# Patient Record
Sex: Female | Born: 1945 | Race: White | Hispanic: No | Marital: Married | Smoking: Former smoker
Health system: Southern US, Community
[De-identification: ages and names within clinical notes are randomized; demographics above are authoritative.]

## PROBLEM LIST (undated history)

## (undated) DIAGNOSIS — Z9889 Other specified postprocedural states: Secondary | ICD-10-CM

## (undated) DIAGNOSIS — I471 Supraventricular tachycardia: Secondary | ICD-10-CM

## (undated) DIAGNOSIS — K219 Gastro-esophageal reflux disease without esophagitis: Secondary | ICD-10-CM

## (undated) DIAGNOSIS — I499 Cardiac arrhythmia, unspecified: Secondary | ICD-10-CM

## (undated) DIAGNOSIS — E785 Hyperlipidemia, unspecified: Secondary | ICD-10-CM

## (undated) DIAGNOSIS — H409 Unspecified glaucoma: Secondary | ICD-10-CM

## (undated) DIAGNOSIS — Z9221 Personal history of antineoplastic chemotherapy: Secondary | ICD-10-CM

## (undated) DIAGNOSIS — R112 Nausea with vomiting, unspecified: Secondary | ICD-10-CM

## (undated) DIAGNOSIS — C50919 Malignant neoplasm of unspecified site of unspecified female breast: Secondary | ICD-10-CM

## (undated) DIAGNOSIS — Z923 Personal history of irradiation: Secondary | ICD-10-CM

## (undated) DIAGNOSIS — I10 Essential (primary) hypertension: Secondary | ICD-10-CM

## (undated) DIAGNOSIS — I4719 Other supraventricular tachycardia: Secondary | ICD-10-CM

## (undated) HISTORY — PX: ANKLE SURGERY: SHX546

## (undated) HISTORY — PX: ABDOMINAL HYSTERECTOMY: SHX81

## (undated) HISTORY — PX: APPENDECTOMY: SHX54

## (undated) HISTORY — PX: TONSILLECTOMY: SUR1361

## (undated) HISTORY — PX: CATARACT EXTRACTION W/ INTRAOCULAR LENS IMPLANT: SHX1309

## (undated) HISTORY — PX: BREAST BIOPSY: SHX20

## (undated) HISTORY — PX: ROTATOR CUFF REPAIR: SHX139

---

## 1998-11-18 ENCOUNTER — Encounter: Payer: Self-pay | Admitting: Orthopedic Surgery

## 1998-11-20 ENCOUNTER — Observation Stay (HOSPITAL_COMMUNITY): Admission: RE | Admit: 1998-11-20 | Discharge: 1998-11-21 | Payer: Self-pay | Admitting: Orthopedic Surgery

## 1999-11-27 ENCOUNTER — Emergency Department (HOSPITAL_COMMUNITY): Admission: EM | Admit: 1999-11-27 | Discharge: 1999-11-28 | Payer: Self-pay | Admitting: Emergency Medicine

## 1999-12-03 ENCOUNTER — Encounter: Payer: Self-pay | Admitting: Urology

## 1999-12-03 ENCOUNTER — Ambulatory Visit (HOSPITAL_COMMUNITY): Admission: RE | Admit: 1999-12-03 | Discharge: 1999-12-03 | Payer: Self-pay | Admitting: Urology

## 2003-02-28 ENCOUNTER — Other Ambulatory Visit: Admission: RE | Admit: 2003-02-28 | Discharge: 2003-02-28 | Payer: Self-pay | Admitting: Obstetrics and Gynecology

## 2004-10-08 ENCOUNTER — Ambulatory Visit: Payer: Self-pay | Admitting: Internal Medicine

## 2004-12-23 ENCOUNTER — Ambulatory Visit: Payer: Self-pay | Admitting: Family Medicine

## 2005-01-12 ENCOUNTER — Ambulatory Visit: Payer: Self-pay | Admitting: Family Medicine

## 2005-09-19 ENCOUNTER — Ambulatory Visit: Payer: Self-pay | Admitting: Family Medicine

## 2006-01-05 ENCOUNTER — Ambulatory Visit: Payer: Self-pay | Admitting: Family Medicine

## 2006-01-10 ENCOUNTER — Ambulatory Visit: Payer: Self-pay | Admitting: Family Medicine

## 2006-01-26 ENCOUNTER — Ambulatory Visit: Payer: Self-pay | Admitting: Internal Medicine

## 2006-01-30 ENCOUNTER — Ambulatory Visit: Payer: Self-pay | Admitting: Family Medicine

## 2006-02-20 ENCOUNTER — Ambulatory Visit: Payer: Self-pay | Admitting: Internal Medicine

## 2007-05-07 ENCOUNTER — Encounter: Admission: RE | Admit: 2007-05-07 | Discharge: 2007-05-07 | Payer: Self-pay | Admitting: Family Medicine

## 2007-05-09 ENCOUNTER — Telehealth (INDEPENDENT_AMBULATORY_CARE_PROVIDER_SITE_OTHER): Payer: Self-pay | Admitting: *Deleted

## 2007-05-17 ENCOUNTER — Encounter: Admission: RE | Admit: 2007-05-17 | Discharge: 2007-05-17 | Payer: Self-pay | Admitting: Family Medicine

## 2007-05-23 ENCOUNTER — Telehealth: Payer: Self-pay | Admitting: *Deleted

## 2007-05-24 ENCOUNTER — Ambulatory Visit: Payer: Self-pay | Admitting: Family Medicine

## 2007-05-24 DIAGNOSIS — B029 Zoster without complications: Secondary | ICD-10-CM | POA: Insufficient documentation

## 2007-06-11 DIAGNOSIS — I10 Essential (primary) hypertension: Secondary | ICD-10-CM | POA: Insufficient documentation

## 2007-06-11 DIAGNOSIS — H409 Unspecified glaucoma: Secondary | ICD-10-CM | POA: Insufficient documentation

## 2007-06-11 DIAGNOSIS — K219 Gastro-esophageal reflux disease without esophagitis: Secondary | ICD-10-CM | POA: Insufficient documentation

## 2007-06-11 DIAGNOSIS — E785 Hyperlipidemia, unspecified: Secondary | ICD-10-CM | POA: Insufficient documentation

## 2007-06-11 DIAGNOSIS — J309 Allergic rhinitis, unspecified: Secondary | ICD-10-CM | POA: Insufficient documentation

## 2007-07-06 ENCOUNTER — Ambulatory Visit: Payer: Self-pay | Admitting: Family Medicine

## 2007-07-06 LAB — CONVERTED CEMR LAB
Alkaline Phosphatase: 72 units/L (ref 39–117)
BUN: 22 mg/dL (ref 6–23)
Basophils Relative: 0.3 % (ref 0.0–1.0)
Bilirubin Urine: NEGATIVE
Bilirubin, Direct: 0.1 mg/dL (ref 0.0–0.3)
CO2: 30 meq/L (ref 19–32)
Creatinine, Ser: 0.7 mg/dL (ref 0.4–1.2)
Eosinophils Relative: 2.2 % (ref 0.0–5.0)
Glucose, Bld: 103 mg/dL — ABNORMAL HIGH (ref 70–99)
Glucose, Urine, Semiquant: NEGATIVE
HCT: 38.8 % (ref 36.0–46.0)
Hemoglobin: 13.8 g/dL (ref 12.0–15.0)
Ketones, urine, test strip: NEGATIVE
LDL Cholesterol: 109 mg/dL — ABNORMAL HIGH (ref 0–99)
Lymphocytes Relative: 37.7 % (ref 12.0–46.0)
Monocytes Absolute: 0.3 10*3/uL (ref 0.2–0.7)
Neutrophils Relative %: 52.3 % (ref 43.0–77.0)
Nitrite: NEGATIVE
Potassium: 4.4 meq/L (ref 3.5–5.1)
Protein, U semiquant: NEGATIVE
RDW: 12.5 % (ref 11.5–14.6)
Sodium: 144 meq/L (ref 135–145)
Specific Gravity, Urine: 1.02
Total Bilirubin: 0.9 mg/dL (ref 0.3–1.2)
Total Protein: 6.2 g/dL (ref 6.0–8.3)
Urobilinogen, UA: 0.2
VLDL: 23 mg/dL (ref 0–40)
WBC Urine, dipstick: NEGATIVE
WBC: 4.3 10*3/uL — ABNORMAL LOW (ref 4.5–10.5)
pH: 6

## 2007-07-13 ENCOUNTER — Ambulatory Visit: Payer: Self-pay | Admitting: Family Medicine

## 2008-05-29 ENCOUNTER — Ambulatory Visit (HOSPITAL_BASED_OUTPATIENT_CLINIC_OR_DEPARTMENT_OTHER): Admission: RE | Admit: 2008-05-29 | Discharge: 2008-05-29 | Payer: Self-pay | Admitting: Family Medicine

## 2008-07-30 ENCOUNTER — Telehealth: Payer: Self-pay | Admitting: Family Medicine

## 2008-08-22 ENCOUNTER — Ambulatory Visit: Payer: Self-pay | Admitting: Family Medicine

## 2008-08-22 LAB — CONVERTED CEMR LAB
ALT: 42 units/L — ABNORMAL HIGH (ref 0–35)
AST: 33 units/L (ref 0–37)
Albumin: 4.3 g/dL (ref 3.5–5.2)
BUN: 13 mg/dL (ref 6–23)
Basophils Relative: 0.3 % (ref 0.0–3.0)
CO2: 31 meq/L (ref 19–32)
Chloride: 106 meq/L (ref 96–112)
Creatinine, Ser: 0.7 mg/dL (ref 0.4–1.2)
Eosinophils Relative: 2.2 % (ref 0.0–5.0)
Glucose, Urine, Semiquant: NEGATIVE
LDL Cholesterol: 89 mg/dL (ref 0–99)
Lymphocytes Relative: 32.7 % (ref 12.0–46.0)
MCV: 95.4 fL (ref 78.0–100.0)
Neutrophils Relative %: 57.2 % (ref 43.0–77.0)
RBC: 4.2 M/uL (ref 3.87–5.11)
T3 Uptake Ratio: 36.7 % (ref 22.5–37.0)
VLDL: 24 mg/dL (ref 0–40)
WBC: 4.5 10*3/uL (ref 4.5–10.5)
pH: 8

## 2008-09-02 ENCOUNTER — Ambulatory Visit: Payer: Self-pay | Admitting: Family Medicine

## 2010-04-08 ENCOUNTER — Telehealth: Payer: Self-pay | Admitting: Family Medicine

## 2010-09-08 ENCOUNTER — Ambulatory Visit (HOSPITAL_BASED_OUTPATIENT_CLINIC_OR_DEPARTMENT_OTHER): Admission: RE | Admit: 2010-09-08 | Discharge: 2010-09-08 | Payer: Self-pay | Admitting: Family Medicine

## 2010-09-08 ENCOUNTER — Ambulatory Visit: Payer: Self-pay | Admitting: Diagnostic Radiology

## 2010-11-18 NOTE — Progress Notes (Signed)
Summary: REFILL REQUEST  Phone Note Refill Request Message from:  Fax from Pharmacy on April 08, 2010 1:31 PM  Refills Requested: Medication #1:  LIPITOR 20 MG  TABS at bedtime   Dosage confirmed as above?Dosage Confirmed   Notes: [facility] - [employer], [employer].  Medication #2:  FUROSEMIDE 20 MG TABS Take 1 tablet by mouth every morning   Dosage confirmed as above?Dosage Confirmed   Notes: [facility] - [employer], [employer].  Medication #3:  ACCUPRIL 20 MG  TABS one by mouth daily   Dosage confirmed as above?Dosage Confirmed   Notes: [facility] - [employer], [employer].    Initial call taken by: Debbra Riding,  April 08, 2010 1:37 PM  Follow-up for Phone Call        Denied Rx due to she has not been in here for a office visit since 2009. Time to come in for cpx & labs Follow-up by: Kathrynn Speed CMA,  April 08, 2010 2:32 PM  Additional Follow-up for Phone Call Additional follow up Details #1::        Contacted pt and left msg advising her to call in and schedule appt with Dr Tawanna Cooler for cpx and labs.  Additional Follow-up by: Debbra Riding,  April 09, 2010 8:08 AM

## 2011-08-25 ENCOUNTER — Other Ambulatory Visit (HOSPITAL_BASED_OUTPATIENT_CLINIC_OR_DEPARTMENT_OTHER): Payer: Self-pay | Admitting: Family Medicine

## 2011-08-25 DIAGNOSIS — Z1231 Encounter for screening mammogram for malignant neoplasm of breast: Secondary | ICD-10-CM

## 2011-09-06 ENCOUNTER — Ambulatory Visit (HOSPITAL_BASED_OUTPATIENT_CLINIC_OR_DEPARTMENT_OTHER)
Admission: RE | Admit: 2011-09-06 | Discharge: 2011-09-06 | Disposition: A | Discharge status: Home / Self Care | Payer: PRIVATE HEALTH INSURANCE | Source: Ambulatory Visit | Attending: Family Medicine | Admitting: Family Medicine

## 2011-09-06 DIAGNOSIS — Z1231 Encounter for screening mammogram for malignant neoplasm of breast: Secondary | ICD-10-CM

## 2013-09-05 ENCOUNTER — Other Ambulatory Visit (HOSPITAL_BASED_OUTPATIENT_CLINIC_OR_DEPARTMENT_OTHER): Payer: Self-pay | Admitting: Internal Medicine

## 2013-09-05 DIAGNOSIS — Z1231 Encounter for screening mammogram for malignant neoplasm of breast: Secondary | ICD-10-CM

## 2013-09-11 ENCOUNTER — Ambulatory Visit (HOSPITAL_BASED_OUTPATIENT_CLINIC_OR_DEPARTMENT_OTHER)
Admission: RE | Admit: 2013-09-11 | Discharge: 2013-09-11 | Disposition: A | Discharge status: Home / Self Care | Payer: PRIVATE HEALTH INSURANCE | Source: Ambulatory Visit | Attending: Internal Medicine | Admitting: Internal Medicine

## 2013-09-11 DIAGNOSIS — Z1231 Encounter for screening mammogram for malignant neoplasm of breast: Secondary | ICD-10-CM | POA: Insufficient documentation

## 2016-02-03 ENCOUNTER — Encounter: Payer: Self-pay | Admitting: Gastroenterology

## 2016-02-15 ENCOUNTER — Other Ambulatory Visit (HOSPITAL_BASED_OUTPATIENT_CLINIC_OR_DEPARTMENT_OTHER): Payer: Self-pay | Admitting: Internal Medicine

## 2016-02-15 DIAGNOSIS — Z1231 Encounter for screening mammogram for malignant neoplasm of breast: Secondary | ICD-10-CM

## 2016-02-18 ENCOUNTER — Ambulatory Visit (HOSPITAL_BASED_OUTPATIENT_CLINIC_OR_DEPARTMENT_OTHER)
Admission: RE | Admit: 2016-02-18 | Discharge: 2016-02-18 | Disposition: A | Discharge status: Home / Self Care | Payer: PRIVATE HEALTH INSURANCE | Source: Ambulatory Visit | Attending: Internal Medicine | Admitting: Internal Medicine

## 2016-02-18 DIAGNOSIS — Z1231 Encounter for screening mammogram for malignant neoplasm of breast: Secondary | ICD-10-CM | POA: Insufficient documentation

## 2016-02-23 ENCOUNTER — Ambulatory Visit (HOSPITAL_BASED_OUTPATIENT_CLINIC_OR_DEPARTMENT_OTHER): Payer: PRIVATE HEALTH INSURANCE

## 2017-03-18 ENCOUNTER — Encounter (HOSPITAL_BASED_OUTPATIENT_CLINIC_OR_DEPARTMENT_OTHER): Payer: Self-pay | Admitting: *Deleted

## 2017-03-18 ENCOUNTER — Emergency Department (HOSPITAL_BASED_OUTPATIENT_CLINIC_OR_DEPARTMENT_OTHER)
Admission: EM | Admit: 2017-03-18 | Discharge: 2017-03-18 | Disposition: A | Discharge status: Home / Self Care | Payer: PRIVATE HEALTH INSURANCE | Attending: Emergency Medicine | Admitting: Emergency Medicine

## 2017-03-18 DIAGNOSIS — H538 Other visual disturbances: Secondary | ICD-10-CM | POA: Insufficient documentation

## 2017-03-18 DIAGNOSIS — I10 Essential (primary) hypertension: Secondary | ICD-10-CM | POA: Diagnosis not present

## 2017-03-18 DIAGNOSIS — Z79899 Other long term (current) drug therapy: Secondary | ICD-10-CM | POA: Insufficient documentation

## 2017-03-18 DIAGNOSIS — X58XXXA Exposure to other specified factors, initial encounter: Secondary | ICD-10-CM | POA: Diagnosis not present

## 2017-03-18 DIAGNOSIS — Z87891 Personal history of nicotine dependence: Secondary | ICD-10-CM | POA: Diagnosis not present

## 2017-03-18 DIAGNOSIS — Y999 Unspecified external cause status: Secondary | ICD-10-CM | POA: Diagnosis not present

## 2017-03-18 DIAGNOSIS — Y939 Activity, unspecified: Secondary | ICD-10-CM | POA: Diagnosis not present

## 2017-03-18 DIAGNOSIS — R51 Headache: Secondary | ICD-10-CM | POA: Diagnosis present

## 2017-03-18 DIAGNOSIS — H18892 Other specified disorders of cornea, left eye: Secondary | ICD-10-CM

## 2017-03-18 DIAGNOSIS — H579 Unspecified disorder of eye and adnexa: Secondary | ICD-10-CM

## 2017-03-18 DIAGNOSIS — Y929 Unspecified place or not applicable: Secondary | ICD-10-CM | POA: Diagnosis not present

## 2017-03-18 HISTORY — DX: Unspecified glaucoma: H40.9

## 2017-03-18 HISTORY — DX: Essential (primary) hypertension: I10

## 2017-03-18 HISTORY — DX: Gastro-esophageal reflux disease without esophagitis: K21.9

## 2017-03-18 HISTORY — DX: Hyperlipidemia, unspecified: E78.5

## 2017-03-18 LAB — CBC WITH DIFFERENTIAL/PLATELET
BASOS ABS: 0 10*3/uL (ref 0.0–0.1)
BASOS PCT: 0 %
Eosinophils Absolute: 0.2 10*3/uL (ref 0.0–0.7)
Eosinophils Relative: 3 %
HEMATOCRIT: 41.4 % (ref 36.0–46.0)
Hemoglobin: 14.9 g/dL (ref 12.0–15.0)
Lymphocytes Relative: 35 %
Lymphs Abs: 2 10*3/uL (ref 0.7–4.0)
MCH: 34.7 pg — ABNORMAL HIGH (ref 26.0–34.0)
MCHC: 36 g/dL (ref 30.0–36.0)
MCV: 96.3 fL (ref 78.0–100.0)
MONO ABS: 0.4 10*3/uL (ref 0.1–1.0)
Monocytes Relative: 7 %
NEUTROS ABS: 3.2 10*3/uL (ref 1.7–7.7)
NEUTROS PCT: 55 %
Platelets: 151 10*3/uL (ref 150–400)
RBC: 4.3 MIL/uL (ref 3.87–5.11)
RDW: 11.7 % (ref 11.5–15.5)
WBC: 5.9 10*3/uL (ref 4.0–10.5)

## 2017-03-18 LAB — BASIC METABOLIC PANEL
Anion gap: 11 (ref 5–15)
BUN: 17 mg/dL (ref 6–20)
CHLORIDE: 105 mmol/L (ref 101–111)
CO2: 22 mmol/L (ref 22–32)
CREATININE: 0.9 mg/dL (ref 0.44–1.00)
Calcium: 9.6 mg/dL (ref 8.9–10.3)
GFR calc non Af Amer: >60 mL/min (ref >60)
Glucose, Bld: 118 mg/dL — ABNORMAL HIGH (ref 65–99)
Potassium: 4.3 mmol/L (ref 3.5–5.1)
Sodium: 138 mmol/L (ref 135–145)

## 2017-03-18 MED ORDER — TETRACAINE HCL 0.5 % OP SOLN
OPHTHALMIC | Status: AC
  Administered 2017-03-18: 1 [drp]
  Filled 2017-03-18: qty 4

## 2017-03-18 MED ORDER — FLUORESCEIN SODIUM 0.6 MG OP STRP
1.0000 | ORAL_STRIP | Freq: Once | OPHTHALMIC | Status: AC
  Administered 2017-03-18: 1 via OPHTHALMIC
  Filled 2017-03-18: qty 1

## 2017-03-18 NOTE — ED Triage Notes (Addendum)
Pt reports being seen at Urgent Care for L protruding eye since yesterday. Pt has slight R facial droop that she states is her normal. Denies nausea, numbness/tingling, dizziness, weakness. Grips equal. Reports headache. Pt states she took Clonidine 0.2 at [facility] for high BP.

## 2017-03-18 NOTE — ED Notes (Signed)
ED Provider at bedside. 

## 2017-03-18 NOTE — ED Provider Notes (Signed)
[facility] Provider Note   CSN: [id number] Arrival date & time: 03/18/17  1320     History   Chief Complaint Chief Complaint  Patient presents with  . Headache  . Eye Problem    HPI Andrea Clarke is a 71 y.o. female sent over from urgent care for evaluation of eye complaint. The patient has a history of glaucoma, hypertension, lens replacement, and chronic dry eyes. She states that yesterday she began having watery in the left eye followed by decrease in vision in the left eye and a sensation of grittiness. She missed treating the eye with Systane drops and ice packs. Patient went in for evaluation at the urgent care today and was noted to be hypertensive. She was sent in to the ER for potential stroke. The patient did not have any complaints in the right eye, however, was noted to have decreased vision in the right. On eye. On visual acuity. She was unaware of this change. Her complaints are of the left eye. She has been taking her Combigan drops as directed. She also takes Accupril for blood pressure at around 2 PM daily. She has a mild headache, which she associates with the tearing in her eye. Her sending physician was concerned about proptosis. The patient does feel that she has some swelling, but does not have any pain with eye movement. She denies severe headaches that have been ongoing for some time, unilateral weakness, difficulty with speech. She denies visual field changes., HPI  Past Medical History:  Diagnosis Date  . GERD (gastroesophageal reflux disease)   . Glaucoma   . Hyperlipidemia   . Hypertension     Patient Active Problem List   Diagnosis Date Noted  . HYPERLIPIDEMIA 06/11/2007  . GLAUCOMA NOS 06/11/2007  . HYPERTENSION 06/11/2007  . ALLERGIC RHINITIS 06/11/2007  . GERD 06/11/2007  . HERPES ZOSTER, UNCOMPLICATED 32/95/1884    Past Surgical History:  Procedure Laterality Date  . ABDOMINAL HYSTERECTOMY    . ANKLE SURGERY    . ROTATOR CUFF  REPAIR Right     OB History    No data available       Home Medications    Prior to Admission medications   Medication Sig Start Date End Date Taking? Authorizing Provider  albuterol (PROVENTIL HFA;VENTOLIN HFA) 108 (90 Base) MCG/ACT inhaler Inhale 1-2 puffs into the lungs every 4 (four) hours as needed for wheezing or shortness of breath.   Yes [provider]  atorvastatin (LIPITOR) 20 MG tablet Take 20 mg by mouth daily.   Yes [provider]  brimonidine-timolol (COMBIGAN) 0.2-0.5 % ophthalmic solution Place 1 drop into both eyes every 12 (twelve) hours.   Yes [provider]  calcium citrate-vitamin D (CITRACAL+D) 315-200 MG-UNIT tablet Take 1 tablet by mouth 2 (two) times daily.   Yes [provider]  cetirizine (ZYRTEC) 10 MG tablet Take 10 mg by mouth daily.   Yes [provider]  Cholecalciferol 1000 units tablet Take 5,000 Units by mouth daily.   Yes [provider]  co-enzyme Q-10 30 MG capsule Take 30 mg by mouth 3 (three) times daily.   Yes [provider]  dexlansoprazole (DEXILANT) 60 MG capsule Take 60 mg by mouth daily.   Yes [provider]  fluticasone (FLONASE) 50 MCG/ACT nasal spray Place 1 spray into both nostrils daily.   Yes [provider]  furosemide (LASIX) 20 MG tablet Take 20 mg by mouth daily.   Yes [provider]  montelukast (SINGULAIR) 10 MG tablet Take 10 mg by mouth daily.   Yes [provider]  Multiple Vitamin (MULTIVITAMIN) tablet Take 1 tablet by mouth daily.   Yes [provider]  omega-3 acid ethyl esters (LOVAZA) 1 g capsule Take 1 g by mouth daily.   Yes [provider]  quinapril (ACCUPRIL) 20 MG tablet Take 20 mg by mouth daily.   Yes [provider]    Family History No family history on file.  Social History Social History  Substance Use Topics  . Smoking status: Former Research scientist (life sciences)  . Smokeless tobacco: Never Used    . Alcohol use Yes     Comment: daily     Allergies   Augmentin [amoxicillin-pot clavulanate] and Oxycodone hcl   Review of Systems Review of Systems Ten systems reviewed and are negative for acute change, except as noted in the HPI.    Physical Exam Updated Vital Signs BP (!) 167/95 (BP Location: Left Arm)   Pulse 61   Temp 98 F (36.7 C) (Oral)   Resp 18   Ht 5' 2.5" (1.588 m)   Wt 70.8 kg (156 lb)   SpO2 99%   BMI 28.08 kg/m   Physical Exam  Constitutional: She is oriented to person, place, and time. She appears well-developed and well-nourished. No distress.  HENT:  Head: Normocephalic and atraumatic.  Eyes: Conjunctivae and EOM are normal. Pupils are equal, round, and reactive to light. No scleral icterus.  Visual acuity as documented. Right eye pressure is 20, left eye pressure is 18. Visual fields to confrontation. No fluid is seen up take. Bilateral injected conjunctiva, worse on the left. No mattering. No proptosis. Some swelling of the left eyelid.   Neck: Normal range of motion.  Cardiovascular: Normal rate, regular rhythm and normal heart sounds.  Exam reveals no gallop and no friction rub.   No murmur heard. Pulmonary/Chest: Effort normal and breath sounds normal. No respiratory distress.  Abdominal: Soft. Bowel sounds are normal. She exhibits no distension and no mass. There is no tenderness. There is no guarding.  Neurological: She is alert and oriented to person, place, and time.  Speech is clear and goal oriented, follows commands Major Cranial nerves without deficit, no facial droop Normal strength in upper and lower extremities bilaterally including dorsiflexion and plantar flexion, strong and equal grip strength Sensation normal to light and sharp touch Moves extremities without ataxia, coordination intact Normal finger to nose and rapid alternating movements Neg romberg, no pronator drift Normal gait Normal heel-shin and balance   Skin: Skin is  warm and dry. She is not diaphoretic.  Psychiatric: Her behavior is normal.  Nursing note and vitals reviewed.    ED Treatments / Results  Labs (all labs ordered are listed, but only abnormal results are displayed) Labs Reviewed  BASIC METABOLIC PANEL - Abnormal; Notable for the following:       Result Value   Glucose, Bld 118 (*)    All other components within normal limits  CBC WITH DIFFERENTIAL/PLATELET - Abnormal; Notable for the following:    MCH 34.7 (*)    All other components within normal limits    EKG  EKG Interpretation None       Radiology No results found.  Procedures Procedures (including critical care time)  Medications Ordered in ED Medications  fluorescein ophthalmic strip 1 strip (not administered)  tetracaine (PONTOCAINE) 0.5 % ophthalmic solution (not administered)     Initial Impression / Assessment  and Plan / ED Course  I have reviewed the triage vital signs and the nursing notes.  Pertinent labs & imaging results that were available during my care of the patient were reviewed by me and considered in my medical decision making (see chart for details).     Patient with left eye discomfort and right eye visual change. She is a normal neurologic examination. I discussed the case with Dr.Tepedino , the ophthalmologist on call for [facility]. He does not feel that she needs any further evaluation at this time, but does suggest that she be seen in the office first thing on Monday. He feels like this is most likely related to a dry eye syndrome of the left. I would be treated appropriately with her sustain drops or ointments. Of note, the patient did have immediate relief of the symptoms in her left eye. When I placed tetracaine in the eye. Which is suggestive of a superficial discomfort. The patient also does not have any signs of end-stage organ damage or hypertensive emergency. Is unable to get a good funduscopic exam secondary to the  patient's lens implants. She will be given her normal dose of hypertensive medications. Her blood pressures have decreased significantly and she appears safe for discharge at this time with outpatient follow-up with her PCP and the ophthalmologist.  Final Clinical Impressions(s) / ED Diagnoses   Final diagnoses:  None    New Prescriptions New Prescriptions   No medications on file     Margarita Mail, PA-C 03/18/17 [place], [place], MD 03/24/17 [phone]

## 2017-03-18 NOTE — Discharge Instructions (Signed)
Get help right away if: You have WORSENING redness, swelling, or other symptoms in only one eye. Your vision is blurred or you have vision changes. You have severe eye pain.

## 2017-03-29 NOTE — Progress Notes (Signed)
Cardiology Office Note   Date:  03/31/2017   ID:  Andrea Clarke, DOB 1946-08-24, MRN [id number]  PCP:  [name], MD  Cardiologist:   Jenkins Rouge, MD   No chief complaint on file.     History of Present Illness: Andrea Clarke is a 71 y.o. female who presents for evaluation of HTN, HL, and chest pain Referred by DR Karle Starch Her daughter use to me my office nurse [name])  She works at [employer] for years no change Inlaws in last month with some stress. Drinks wine daily BP elevated at primary office sent to ER no TIA. ACE doubled. On diuretic already. Has blepharitis That has been worse this month  Some salt in diet No excess NSAI's.  No chronic kidney disease and Cr normal in ER  Past Medical History:  Diagnosis Date  . GERD (gastroesophageal reflux disease)   . Glaucoma   . Hyperlipidemia   . Hypertension     Past Surgical History:  Procedure Laterality Date  . ABDOMINAL HYSTERECTOMY    . ANKLE SURGERY    . ROTATOR CUFF REPAIR Right      Current Outpatient Prescriptions  Medication Sig Dispense Refill  . albuterol (PROVENTIL HFA;VENTOLIN HFA) 108 (90 Base) MCG/ACT inhaler Inhale 1-2 puffs into the lungs every 4 (four) hours as needed for wheezing or shortness of breath.    Marland Kitchen atorvastatin (LIPITOR) 20 MG tablet Take 20 mg by mouth daily.    . brimonidine-timolol (COMBIGAN) 0.2-0.5 % ophthalmic solution Place 1 drop into both eyes every 12 (twelve) hours.    . calcium citrate-vitamin D (CITRACAL+D) 315-200 MG-UNIT tablet Take 1 tablet by mouth 2 (two) times daily.    . cetirizine (ZYRTEC) 10 MG tablet Take 10 mg by mouth daily.    . Cholecalciferol (VITAMIN D-1000 MAX ST) 1000 units tablet Take 1,000 Units by mouth daily.    Marland Kitchen co-enzyme Q-10 30 MG capsule Take 30 mg by mouth 3 (three) times daily.    Marland Kitchen dexlansoprazole (DEXILANT) 60 MG capsule Take 60 mg by mouth daily.    . fluticasone (FLONASE) 50 MCG/ACT nasal spray Place 1 spray into both  nostrils daily.    . furosemide (LASIX) 20 MG tablet Take 20 mg by mouth daily.    . montelukast (SINGULAIR) 10 MG tablet Take 10 mg by mouth daily.    . Multiple Vitamin (MULTIVITAMIN) tablet Take 1 tablet by mouth daily.    Marland Kitchen omega-3 acid ethyl esters (LOVAZA) 1 g capsule Take 1 g by mouth daily.    Marland Kitchen amLODipine (NORVASC) 10 MG tablet Take 1 tablet (10 mg total) by mouth daily. 90 tablet 3  . quinapril (ACCUPRIL) 20 MG tablet Take 2 tablets (40 mg total) by mouth daily.     No current facility-administered medications for this visit.     Allergies:   Augmentin [amoxicillin-pot clavulanate] and Oxycodone hcl    Social History:  The patient  reports that she has quit smoking. She has never used smokeless tobacco. She reports that she drinks alcohol. She reports that she does not use drugs.   Family History:  The patient's family history includes Hypertension in her mother.    ROS:  Please see the history of present illness.   Otherwise, review of systems are positive for none.   All other systems are reviewed and negative.    PHYSICAL EXAM: VS:  BP (!) 178/100   Pulse (!) 58   Ht 5' 2.5" (  1.588 m)   Wt 70 kg (154 lb 6.4 oz)   SpO2 97%   BMI 27.79 kg/m  , BMI Body mass index is 27.79 kg/m. Affect appropriate Healthy:  appears stated age 19: small scleral hemorrhage right injected both  Neck supple with no adenopathy JVP normal no bruits no thyromegaly Lungs clear with no wheezing and good diaphragmatic motion Heart:  S1/S2 no murmur, no rub, gallop or click PMI normal Abdomen: benighn, BS positve, no tenderness, no AAA no bruit.  No HSM or HJR Distal pulses intact with no bruits No edema Neuro non-focal Skin warm and dry No muscular weakness    EKG:  SR rate 61 normal 2009 03/31/17  SR arte 58 low voltage poor R wave progression    Recent Labs: 03/18/2017: BUN 17; Creatinine, Ser 0.90; Hemoglobin 14.9; Platelets 151; Potassium 4.3; Sodium 138    Lipid Panel      Component Value Date/Time   CHOL 151 08/22/2008 1042   TRIG 120 08/22/2008 1042   HDL 38.2 (L) 08/22/2008 1042   CHOLHDL 4.0 CALC 08/22/2008 1042   VLDL 24 08/22/2008 1042   LDLCALC 89 08/22/2008 1042      Wt Readings from Last 3 Encounters:  03/31/17 70 kg (154 lb 6.4 oz)  03/18/17 70.8 kg (156 lb)  09/02/08 66.7 kg (147 lb)      Other studies Reviewed: Additional studies/ records that were reviewed today include: Notes and labs from Dr Johnsie Cancel primary Old ECG 2009 .    ASSESSMENT AND PLAN:  1.  Chest Pain atypical ECG normal get BP under control first and consider ETT 2. HTN  Add norvasc 10 mg in am take Quinapril 40 mg afternoon and 20 mg lasix evening Nurse visit two weeks Cut back wine/ETOH intake 3. HL continue statin labs with primary 4. Blepharitis:  Needs to f/u back with opthalmologic doctor ? Adenovirus Also has tear Duct issues and glaucoma    Current medicines are reviewed at length with the patient today.  The patient does not have concerns regarding medicines.  The following changes have been made:  no change  Labs/ tests ordered today include: Renal Duplex   Orders Placed This Encounter  Procedures  . EKG 12-Lead  . PR OFFICE OUTPATIENT VISIT 5 MINUTES     Disposition:   FU with me next available     Signed, Jenkins Rouge, MD  03/31/2017 4:31 PM    [facility] [place], [place], [place]  [address] Phone: [phone]; Fax: [phone]

## 2017-03-31 ENCOUNTER — Ambulatory Visit (INDEPENDENT_AMBULATORY_CARE_PROVIDER_SITE_OTHER): Payer: PRIVATE HEALTH INSURANCE | Admitting: Cardiovascular Disease

## 2017-03-31 ENCOUNTER — Encounter: Payer: Self-pay | Admitting: Cardiovascular Disease

## 2017-03-31 VITALS — BP 178/100 | HR 58 | Ht 62.5 in | Wt 154.4 lb

## 2017-03-31 DIAGNOSIS — I10 Essential (primary) hypertension: Secondary | ICD-10-CM | POA: Diagnosis not present

## 2017-03-31 MED ORDER — QUINAPRIL HCL 20 MG PO TABS: 20.0000 mg | ORAL_TABLET | Freq: Two times a day (BID) | ORAL | Status: DC

## 2017-03-31 MED ORDER — AMLODIPINE BESYLATE 10 MG PO TABS: 10.0000 mg | ORAL_TABLET | Freq: Every day | ORAL | 3 refills | Status: DC

## 2017-03-31 MED ORDER — QUINAPRIL HCL 20 MG PO TABS: 40.0000 mg | ORAL_TABLET | Freq: Every day | ORAL | Status: DC

## 2017-03-31 NOTE — Patient Instructions (Signed)
Medication Instructions:  1. START NORVASC 10 MG DAILY; RX HAS BEEN SENT IN  Labwork: NONE ORDRED  Testing/Procedures: 1. Your physician has requested that you have a renal artery duplex. During this test, an ultrasound is used to evaluate blood flow to the kidneys. Allow one hour for this exam. Do not eat after midnight the day before and avoid carbonated beverages. Take your medications as you usually do.    Follow-Up: 1. NURSE VISIT TO BE DONE IN 2 WEEKS DUE TO NEW START OF NORVASC 10 MG DAILY  2. .DR. NISHAN'S NEXT AVAILABLE  Any Other Special Instructions Will Be Listed Below (If Applicable).     If you need a refill on your cardiac medications before your next appointment, please call your pharmacy.

## 2017-04-14 ENCOUNTER — Ambulatory Visit (INDEPENDENT_AMBULATORY_CARE_PROVIDER_SITE_OTHER): Payer: PRIVATE HEALTH INSURANCE

## 2017-04-14 VITALS — BP 118/68 | HR 72

## 2017-04-14 DIAGNOSIS — I10 Essential (primary) hypertension: Secondary | ICD-10-CM | POA: Diagnosis not present

## 2017-04-14 NOTE — Progress Notes (Signed)
Patient presents for BP check after medication changes made at OV with Dr. Johnsie Cancel on 03/31/17-started norvasc 10mg  daily.   Reports BP has been stable at home and feeling well.   BP today 118/68 and HR 72.   Verbalized to continue current treatment and would make Dr. Johnsie Cancel aware.  Patient aware and verbalized understanding.  Advised to call with questions or concerns.

## 2017-04-28 ENCOUNTER — Ambulatory Visit (HOSPITAL_COMMUNITY)
Admission: RE | Admit: 2017-04-28 | Discharge: 2017-04-28 | Disposition: A | Discharge status: Home / Self Care | Payer: PRIVATE HEALTH INSURANCE | Source: Ambulatory Visit | Attending: Cardiology | Admitting: Cardiology

## 2017-04-28 DIAGNOSIS — I10 Essential (primary) hypertension: Secondary | ICD-10-CM | POA: Insufficient documentation

## 2017-05-30 NOTE — Progress Notes (Signed)
Cardiology Office Note   Date:  06/01/2017   ID:  AVIVA WOLFER, DOB 1946-03-23, MRN [id number]  PCP:  [name], MD  Cardiologist:   Jenkins Rouge, MD   No chief complaint on file.     History of Present Illness: Andrea Clarke is a 71 y.o. female who presents for evaluation of HTN, HL, and chest pain  Initially seen 03/31/17 Referred by DR Karle Starch Her daughter use to me my office nurse Colletta [place])  She works at [employer] for years no change in job  Inlaws visiting May with some stress. Drinks wine daily BP elevated at primary office sent to ER no TIA. ACE doubled. On diuretic already. Has blepharitis  Some salt in diet No excess NSAI's.  No chronic kidney disease and Cr normal in ER  03/31/17 started on norvasc for better BP control 04/14/17 Nurse visit BP improved and normal range   Her primary put her on a combination pill with valsartan/amlodipine and HCTZ This is bad on multiple levels as her meds need to be more spread during day Cannot adjust individual doses Valsartan has a recall for carcinogen HCTZ not helpful with swelling  Will stop norvasc incase it is contributing to edema and resume quniapril, lasix and add cardizem 120 mg   Past Medical History:  Diagnosis Date  . GERD (gastroesophageal reflux disease)   . Glaucoma   . Hyperlipidemia   . Hypertension     Past Surgical History:  Procedure Laterality Date  . ABDOMINAL HYSTERECTOMY    . ANKLE SURGERY    . ROTATOR CUFF REPAIR Right      Current Outpatient Prescriptions  Medication Sig Dispense Refill  . albuterol (PROVENTIL HFA;VENTOLIN HFA) 108 (90 Base) MCG/ACT inhaler Inhale 1-2 puffs into the lungs every 4 (four) hours as needed for wheezing or shortness of breath.    Marland Kitchen atorvastatin (LIPITOR) 20 MG tablet Take 20 mg by mouth daily.    . brimonidine-timolol (COMBIGAN) 0.2-0.5 % ophthalmic solution Place 1 drop into both eyes every 12 (twelve) hours.    . calcium citrate-vitamin D  (CITRACAL+D) 315-200 MG-UNIT tablet Take 1 tablet by mouth 2 (two) times daily.    . cetirizine (ZYRTEC) 10 MG tablet Take 10 mg by mouth daily.    . Cholecalciferol (VITAMIN D-1000 MAX ST) 1000 units tablet Take 1,000 Units by mouth daily.    Marland Kitchen co-enzyme Q-10 30 MG capsule Take 30 mg by mouth 3 (three) times daily.    Marland Kitchen dexlansoprazole (DEXILANT) 60 MG capsule Take 60 mg by mouth daily.    . fluticasone (FLONASE) 50 MCG/ACT nasal spray Place 1 spray into both nostrils daily.    . montelukast (SINGULAIR) 10 MG tablet Take 10 mg by mouth daily.    . Multiple Vitamin (MULTIVITAMIN) tablet Take 1 tablet by mouth daily.    Marland Kitchen omega-3 acid ethyl esters (LOVAZA) 1 g capsule Take 1 g by mouth daily.    Marland Kitchen diltiazem (CARDIZEM CD) 120 MG 24 hr capsule Take 1 capsule (120 mg total) by mouth every evening. 90 capsule 3  . furosemide (LASIX) 20 MG tablet Take 1 tablet (20 mg total) by mouth daily at 2 PM. 90 tablet 3  . quinapril (ACCUPRIL) 20 MG tablet Take 1 tablet (20 mg total) by mouth every morning. 90 tablet 3   No current facility-administered medications for this visit.     Allergies:   Augmentin [amoxicillin-pot clavulanate] and Oxycodone hcl    Social History:  The patient  reports that she has quit smoking. She has never used smokeless tobacco. She reports that she drinks alcohol. She reports that she does not use drugs.   Family History:  The patient's family history includes Hypertension in her mother.    ROS:  Please see the history of present illness.   Otherwise, review of systems are positive for none.   All other systems are reviewed and negative.    PHYSICAL EXAM: VS:  BP 130/70   Pulse 69   Ht 5\' 2"  (1.575 m)   Wt 158 lb (71.7 kg)   SpO2 97%   BMI 28.90 kg/m  , BMI Body mass index is 28.9 kg/m. Affect appropriate Healthy:  appears stated age 60: normal Neck supple with no adenopathy JVP normal no bruits no thyromegaly Lungs clear with no wheezing and good  diaphragmatic motion Heart:  S1/S2 no murmur, no rub, gallop or click PMI normal Abdomen: benighn, BS positve, no tenderness, no AAA no bruit.  No HSM or HJR Distal pulses intact with no bruits No edema Neuro non-focal Skin warm and dry No muscular weakness   EKG:  SR rate 61 normal 2009 03/31/17  SR arte 58 low voltage poor R wave progression    Recent Labs: 03/18/2017: BUN 17; Creatinine, Ser 0.90; Hemoglobin 14.9; Platelets 151; Potassium 4.3; Sodium 138    Lipid Panel    Component Value Date/Time   CHOL 151 08/22/2008 1042   TRIG 120 08/22/2008 1042   HDL 38.2 (L) 08/22/2008 1042   CHOLHDL 4.0 CALC 08/22/2008 1042   VLDL 24 08/22/2008 1042   LDLCALC 89 08/22/2008 1042      Wt Readings from Last 3 Encounters:  06/01/17 158 lb (71.7 kg)  03/31/17 154 lb 6.4 oz (70 kg)  03/18/17 156 lb (70.8 kg)      Other studies Reviewed: Additional studies/ records that were reviewed today include: Notes and labs from Dr Johnsie Cancel primary Old ECG 2009 .    ASSESSMENT AND PLAN:  1.  Chest Pain atypical ECG normal  F/u exercise echo now that BP better controlled  2. HTN  Improved change to quinapril/cardizem  /lasix due to edema and recall  3. HL continue statin labs with primary 4. Blepharitis:  Needs to f/u back with opthalmologic doctor ? Adenovirus Also has tear Duct issues and glaucoma    Current medicines are reviewed at length with the patient today.  The patient does not have concerns regarding medicines.  The following changes have been made:  See above   Labs/ tests ordered today include: Ex stress echo   Orders Placed This Encounter  Procedures  . ECHOCARDIOGRAM STRESS TEST     Disposition:   FU with me next available     Signed, Jenkins Rouge, MD  06/01/2017 3:56 PM    [facility] [place], [place], [place]  [address] Phone: [phone]; Fax: [phone]

## 2017-06-01 ENCOUNTER — Encounter (INDEPENDENT_AMBULATORY_CARE_PROVIDER_SITE_OTHER): Payer: Self-pay

## 2017-06-01 ENCOUNTER — Encounter: Payer: Self-pay | Admitting: Cardiovascular Disease

## 2017-06-01 ENCOUNTER — Ambulatory Visit (INDEPENDENT_AMBULATORY_CARE_PROVIDER_SITE_OTHER): Payer: PRIVATE HEALTH INSURANCE | Admitting: Cardiovascular Disease

## 2017-06-01 VITALS — BP 130/70 | HR 69 | Ht 62.0 in | Wt 158.0 lb

## 2017-06-01 DIAGNOSIS — R079 Chest pain, unspecified: Secondary | ICD-10-CM

## 2017-06-01 MED ORDER — QUINAPRIL HCL 20 MG PO TABS: 20.0000 mg | ORAL_TABLET | ORAL | 3 refills | Status: DC

## 2017-06-01 MED ORDER — FUROSEMIDE 20 MG PO TABS: 20.0000 mg | ORAL_TABLET | Freq: Every day | ORAL | 3 refills | Status: DC

## 2017-06-01 MED ORDER — DILTIAZEM HCL ER COATED BEADS 120 MG PO CP24: 120.0000 mg | ORAL_CAPSULE | Freq: Every evening | ORAL | 3 refills | Status: DC

## 2017-06-01 NOTE — Patient Instructions (Addendum)
Medication Instructions:  1. START QUINAPRIL 20 MG EVERY MORNING 2. START CARDIZEM 120 MG EVERY EVENING 3. START LASIX 20 MG EVERY AFTERNOON  STOP THE TRIPLE COMBO PILL (AMLODIPINE, VALSARTAN, HCTZ)  Labwork: NONE ORDERED  Testing/Procedures: 1. Your physician has requested that you have a stress echocardiogram. For further information please visit [email or web address]. Please follow instruction sheet as given.    Follow-Up: DR. Johnsie Cancel 3 MONTHS  Any Other Special Instructions Will Be Listed Below (If Applicable).     If you need a refill on your cardiac medications before your next appointment, please call your pharmacy.

## 2017-06-29 ENCOUNTER — Telehealth (HOSPITAL_COMMUNITY): Payer: Self-pay | Admitting: *Deleted

## 2017-06-29 NOTE — Telephone Encounter (Signed)
Patient given detailed instructions per Stress Test Requisition Sheet for test on 07/03/17 at 2:30.Patient Notified to arrive 30 minutes early, and that it is imperative to arrive on time for appointment to keep from having the test rescheduled.  Patient verbalized understanding. Andrea Clarke

## 2017-07-03 ENCOUNTER — Encounter (HOSPITAL_COMMUNITY): Payer: Self-pay | Admitting: Radiology

## 2017-07-03 ENCOUNTER — Ambulatory Visit (HOSPITAL_COMMUNITY): Payer: PRIVATE HEALTH INSURANCE | Attending: Cardiology

## 2017-07-03 ENCOUNTER — Encounter (INDEPENDENT_AMBULATORY_CARE_PROVIDER_SITE_OTHER): Payer: Self-pay

## 2017-07-03 ENCOUNTER — Ambulatory Visit (HOSPITAL_COMMUNITY): Payer: PRIVATE HEALTH INSURANCE

## 2017-07-03 DIAGNOSIS — E785 Hyperlipidemia, unspecified: Secondary | ICD-10-CM | POA: Diagnosis not present

## 2017-07-03 DIAGNOSIS — I10 Essential (primary) hypertension: Secondary | ICD-10-CM | POA: Diagnosis not present

## 2017-07-03 DIAGNOSIS — R079 Chest pain, unspecified: Secondary | ICD-10-CM

## 2017-07-03 NOTE — Progress Notes (Signed)
Due arrhythmias noted in the patient's stress echo, the EKG's were brought to the DOD, Dr. Radford Pax for review. The patient was discharged per Dr. Radford Pax, with a recommendation that the patient have Holter monitor. Patient was discharged without symptoms.

## 2017-07-05 ENCOUNTER — Telehealth: Payer: Self-pay

## 2017-07-05 DIAGNOSIS — I493 Ventricular premature depolarization: Secondary | ICD-10-CM

## 2017-07-05 DIAGNOSIS — R079 Chest pain, unspecified: Secondary | ICD-10-CM

## 2017-07-05 NOTE — Telephone Encounter (Signed)
-----   Message from Josue Hector, MD sent at 07/04/2017  9:50 AM EDT ----- Echo not good quality needs exercise myovue and 48 hour holter to quantitate PVC;s then f/u with me

## 2017-07-05 NOTE — Telephone Encounter (Signed)
Patient has myoview and 48 hour holter monitor scheduled for 07/20/17. Went over instruction for [employer] with patient. Patient verbalized understanding. Patient has f/u appt with Dr. Johnsie Cancel in November.

## 2017-07-05 NOTE — Telephone Encounter (Signed)
Called patient with results of echo. Per Dr. Johnsie Cancel, Echo not good quality needs exercise myovue and 48 hour holter to quantitate PVC;s then f/u with me. Patient verbalized understanding. Will send message to [facility] to schedule.

## 2017-07-18 ENCOUNTER — Telehealth (HOSPITAL_COMMUNITY): Payer: Self-pay | Admitting: *Deleted

## 2017-07-18 NOTE — Telephone Encounter (Signed)
Left message on voicemail per DPR in reference to upcoming appointment scheduled on 07/20/17 with detailed instructions given per Myocardial Perfusion Study Information Sheet for the test. LM to arrive 15 minutes early, and that it is imperative to arrive on time for appointment to keep from having the test rescheduled. If you need to cancel or reschedule your appointment, please call the office within 24 hours of your appointment. Failure to do so may result in a cancellation of your appointment, and a $50 no show fee. Phone number given for call back for any questions. Abhijot Straughter Jacqueline    

## 2017-07-20 ENCOUNTER — Ambulatory Visit (INDEPENDENT_AMBULATORY_CARE_PROVIDER_SITE_OTHER): Payer: PRIVATE HEALTH INSURANCE

## 2017-07-20 ENCOUNTER — Ambulatory Visit (HOSPITAL_COMMUNITY): Payer: PRIVATE HEALTH INSURANCE | Attending: Cardiovascular Disease

## 2017-07-20 DIAGNOSIS — E785 Hyperlipidemia, unspecified: Secondary | ICD-10-CM | POA: Insufficient documentation

## 2017-07-20 DIAGNOSIS — I10 Essential (primary) hypertension: Secondary | ICD-10-CM | POA: Diagnosis not present

## 2017-07-20 DIAGNOSIS — R079 Chest pain, unspecified: Secondary | ICD-10-CM

## 2017-07-20 DIAGNOSIS — I493 Ventricular premature depolarization: Secondary | ICD-10-CM

## 2017-07-20 DIAGNOSIS — R0600 Dyspnea, unspecified: Secondary | ICD-10-CM | POA: Diagnosis not present

## 2017-07-20 LAB — MYOCARDIAL PERFUSION IMAGING
CHL CUP MPHR: 151 {beats}/min
CHL CUP NUCLEAR SDS: 3
CHL CUP NUCLEAR SRS: 3
CHL CUP RESTING HR STRESS: 61 {beats}/min
CSEPED: 6 min
CSEPPHR: 144 {beats}/min
Estimated workload: 7 METS
Exercise duration (sec): 0 s
LHR: 0.29
LV sys vol: 17 mL
LVDIAVOL: 60 mL (ref 46–106)
Percent HR: 95 %
SSS: 6
TID: 0.85

## 2017-07-20 MED ORDER — TECHNETIUM TC 99M TETROFOSMIN IV KIT
10.4000 | PACK | Freq: Once | INTRAVENOUS | Status: AC | PRN
  Administered 2017-07-20: 10.4 via INTRAVENOUS
  Filled 2017-07-20: qty 11

## 2017-07-20 MED ORDER — TECHNETIUM TC 99M TETROFOSMIN IV KIT
32.7000 | PACK | Freq: Once | INTRAVENOUS | Status: AC | PRN
  Administered 2017-07-20: 32.7 via INTRAVENOUS
  Filled 2017-07-20: qty 33

## 2017-07-26 ENCOUNTER — Telehealth: Payer: Self-pay | Admitting: Cardiovascular Disease

## 2017-07-26 MED ORDER — DILTIAZEM HCL ER COATED BEADS 240 MG PO CP24: 240.0000 mg | ORAL_CAPSULE | Freq: Every evening | ORAL | 3 refills | Status: DC

## 2017-07-26 NOTE — Telephone Encounter (Signed)
Called patient to see if she is having any symptoms. Patient stated she has felt a twinge in the chest here and there, but is not constant. Patient stated she is usually SOB. Consulted Dr. Curt Bears, DOD, after reviewing monitor, he recommends patient see EP ASAP and increase her Cardizem to 240 mg.  Called patient to let her know of medications,and informed her that someone would be calling to make an appointment with one of our EP doctors. Will send message to EP scheduler.

## 2017-07-26 NOTE — Telephone Encounter (Signed)
Need telemetry strips to review get them and review with DOD or EP

## 2017-07-26 NOTE — Telephone Encounter (Signed)
[facility] called to report to Dr Johnsie Cancel and RN that 48 hour holter monitor that was ordered and placed on the pt on 10/4, came back as a critical report showing that during that course, the pt was in SVT and V-tach.  Per [facility], the pt was in SVT and V-tach with a HR ranging from 83-197 bpm.  Per [facility], this was during the 48 hour holter monitoring process.  Per [facility], they will be faxing the pts holter monitor results to our office for Dr Johnsie Cancel to review. Per [facility], this should also be available for Dr Johnsie Cancel to review electronically.  Will route this message to Dr Kyla Balzarine RN for further review and follow-up.

## 2017-07-26 NOTE — Telephone Encounter (Signed)
New message    Critical heart monitor results.

## 2017-08-15 DIAGNOSIS — H409 Unspecified glaucoma: Secondary | ICD-10-CM | POA: Insufficient documentation

## 2017-08-15 DIAGNOSIS — E785 Hyperlipidemia, unspecified: Secondary | ICD-10-CM | POA: Insufficient documentation

## 2017-08-15 DIAGNOSIS — I10 Essential (primary) hypertension: Secondary | ICD-10-CM | POA: Insufficient documentation

## 2017-08-15 DIAGNOSIS — K219 Gastro-esophageal reflux disease without esophagitis: Secondary | ICD-10-CM | POA: Insufficient documentation

## 2017-08-17 ENCOUNTER — Institutional Professional Consult (permissible substitution): Payer: PRIVATE HEALTH INSURANCE | Admitting: Cardiology

## 2017-08-28 NOTE — Progress Notes (Deleted)
Cardiology Office Note   Date:  08/28/2017   ID:  Andrea Clarke, DOB 1946-09-18, MRN [id number]  PCP:  [name], MD  Cardiologist:   Jenkins Rouge, MD   No chief complaint on file.     History of Present Illness: Andrea Clarke is a 71 y.o. 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 Drinks wine daily BP elevated at primary office sent to ER no TIA. ACE doubled. On diuretic already. Has blepharitis  Some salt in diet No excess NSAI's.  No chronic kidney disease and Cr normal in ER  03/31/17 started on norvasc for better BP control 04/14/17 Nurse visit BP improved and normal range   Stress echo attempted 07/03/17 but had salvos of PVCls and NSVT Myovue done 07/20/17 normal EF 72% Holter with Less than 1% PACls and PVC;s but multiple runs of WCT and short runs of narrow comlex Tachycardia Minimum HR 41 maximum 145 bpm reviewed strips  ***   Past Medical History:  Diagnosis Date  . GERD (gastroesophageal reflux disease)   . Glaucoma   . Hyperlipidemia   . Hypertension     Past Surgical History:  Procedure Laterality Date  . ABDOMINAL HYSTERECTOMY    . ANKLE SURGERY    . ROTATOR CUFF REPAIR Right      Current Outpatient Medications  Medication Sig Dispense Refill  . albuterol (PROVENTIL HFA;VENTOLIN HFA) 108 (90 Base) MCG/ACT inhaler Inhale 1-2 puffs into the lungs every 4 (four) hours as needed for wheezing or shortness of breath.    Marland Kitchen atorvastatin (LIPITOR) 20 MG tablet Take 20 mg by mouth daily.    . brimonidine-timolol (COMBIGAN) 0.2-0.5 % ophthalmic solution Place 1 drop into both eyes every 12 (twelve) hours.    . calcium citrate-vitamin D (CITRACAL+D) 315-200 MG-UNIT tablet Take 1 tablet by mouth 2 (two) times daily.    . cetirizine  (ZYRTEC) 10 MG tablet Take 10 mg by mouth daily.    . Cholecalciferol (VITAMIN D-1000 MAX ST) 1000 units tablet Take 1,000 Units by mouth daily.    Marland Kitchen co-enzyme Q-10 30 MG capsule Take 30 mg by mouth 3 (three) times daily.    Marland Kitchen dexlansoprazole (DEXILANT) 60 MG capsule Take 60 mg by mouth daily.    Marland Kitchen diltiazem (CARDIZEM CD) 240 MG 24 hr capsule Take 1 capsule (240 mg total) by mouth every evening. 90 capsule 3  . fluticasone (FLONASE) 50 MCG/ACT nasal spray Place 1 spray into both nostrils daily.    . furosemide (LASIX) 20 MG tablet Take 1 tablet (20 mg total) by mouth daily at 2 PM. 90 tablet 3  . montelukast (SINGULAIR) 10 MG tablet Take 10 mg by mouth daily.    . Multiple Vitamin (MULTIVITAMIN) tablet Take 1 tablet by mouth daily.    Marland Kitchen omega-3 acid ethyl esters (LOVAZA) 1 g capsule Take 1 g by mouth daily.    . quinapril (ACCUPRIL) 20 MG tablet Take 1 tablet (20 mg total) by mouth every morning. 90 tablet 3   No current facility-administered medications for this visit.     Allergies:   Augmentin [amoxicillin-pot clavulanate] and Oxycodone hcl    Social History:  The patient  reports that she has quit smoking. she has never used smokeless  tobacco. She reports that she drinks alcohol. She reports that she does not use drugs.   Family History:  The patient's family history includes Hypertension in her mother.    ROS:  Please see the history of present illness.   Otherwise, review of systems are positive for none.   All other systems are reviewed and negative.    PHYSICAL EXAM: VS:  There were no vitals taken for this visit. , BMI There is no height or weight on file to calculate BMI. Affect appropriate Healthy:  appears stated age 41: normal Neck supple with no adenopathy JVP normal no bruits no thyromegaly Lungs clear with no wheezing and good diaphragmatic motion Heart:  S1/S2 no murmur, no rub, gallop or click PMI normal Abdomen: benighn, BS positve, no tenderness, no AAA no  bruit.  No HSM or HJR Distal pulses intact with no bruits No edema Neuro non-focal Skin warm and dry No muscular weakness   EKG:  SR rate 61 normal 2009 03/31/17  SR arte 58 low voltage poor R wave progression    Recent Labs: 03/18/2017: BUN 17; Creatinine, Ser 0.90; Hemoglobin 14.9; Platelets 151; Potassium 4.3; Sodium 138    Lipid Panel    Component Value Date/Time   CHOL 151 08/22/2008 1042   TRIG 120 08/22/2008 1042   HDL 38.2 (L) 08/22/2008 1042   CHOLHDL 4.0 CALC 08/22/2008 1042   VLDL 24 08/22/2008 1042   LDLCALC 89 08/22/2008 1042      Wt Readings from Last 3 Encounters:  07/20/17 158 lb (71.7 kg)  06/01/17 158 lb (71.7 kg)  03/31/17 154 lb 6.4 oz (70 kg)      Other studies Reviewed: Additional studies/ records that were reviewed today include: Notes and labs from Dr Johnsie Cancel primary Old ECG 2009 .    ASSESSMENT AND PLAN:  1.  Chest Pain atypical ECG normal  Normal myovue observe  2. HTN  Improved change to quinapril/cardizem  /lasix due to edema and recall  3. HL continue statin labs with primary 4.Arrhythmia:  *** needs f/u with EP ***   Current medicines are reviewed at length with the patient today.  The patient does not have concerns regarding medicines.  The following changes have been made:  ***  Labs/ tests ordered today include: ***  No orders of the defined types were placed in this encounter.    Disposition:   FU with EP     Signed, Jenkins Rouge, MD  08/28/2017 2:44 PM    [facility] [place], [place], [place]  [address] Phone: [phone]; Fax: [phone]

## 2017-08-29 ENCOUNTER — Telehealth: Payer: Self-pay | Admitting: Cardiovascular Disease

## 2017-08-29 NOTE — Telephone Encounter (Signed)
°  New message ° °Pt verbalized that she is returning call for the rn  °

## 2017-08-29 NOTE — Telephone Encounter (Signed)
Called patient to let her know that she will see Dr. Curt Bears tomorrow and she has an appointment with Dr. Johnsie Cancel next week. We cancelled Dr. Johnsie Cancel office visit and will let Dr. Curt Bears recommend when to follow up with Dr. Johnsie Cancel.

## 2017-08-30 ENCOUNTER — Encounter (INDEPENDENT_AMBULATORY_CARE_PROVIDER_SITE_OTHER): Payer: Self-pay

## 2017-08-30 ENCOUNTER — Ambulatory Visit: Payer: PRIVATE HEALTH INSURANCE | Admitting: Cardiology

## 2017-08-30 ENCOUNTER — Encounter: Payer: Self-pay | Admitting: Cardiology

## 2017-08-30 VITALS — BP 122/80 | HR 49 | Ht 63.0 in | Wt 158.6 lb

## 2017-08-30 DIAGNOSIS — I10 Essential (primary) hypertension: Secondary | ICD-10-CM | POA: Diagnosis not present

## 2017-08-30 DIAGNOSIS — E785 Hyperlipidemia, unspecified: Secondary | ICD-10-CM | POA: Diagnosis not present

## 2017-08-30 DIAGNOSIS — I472 Ventricular tachycardia: Secondary | ICD-10-CM

## 2017-08-30 DIAGNOSIS — R000 Tachycardia, unspecified: Secondary | ICD-10-CM

## 2017-08-30 MED ORDER — DILTIAZEM HCL ER COATED BEADS 180 MG PO CP24: 180.0000 mg | ORAL_CAPSULE | Freq: Every day | ORAL | 1 refills | Status: DC

## 2017-08-30 MED ORDER — FLECAINIDE ACETATE 100 MG PO TABS: 100.0000 mg | ORAL_TABLET | Freq: Two times a day (BID) | ORAL | 3 refills | Status: DC

## 2017-08-30 NOTE — Patient Instructions (Signed)
Medication Instructions:  Your physician has recommended you make the following change in your medications:  1. DECREASE Diltiazem to 180 mg daily 2. START taking FLECAINIDE 100 mg twice daily -- YOU WILL START THIS  MEDICATION 7-10 DAYS PRIOR TO STRESS TESTING  * If you need a refill on your cardiac medications before your next appointment, please call your pharmacy. *  Labwork: None ordered  Testing/Procedures: Your physician has requested that you have an exercise tolerance test at least 10 days from today. For further information please visit [email or web address]. Please also follow instruction sheet, as given.  YOU WILL START FLECAINIDE 7-10 DAYS PRIOR TO THIS TESTING.  Follow-Up: Your physician recommends that you schedule a follow-up appointment in: 3 months with Dr. Curt Bears.  Thank you for choosing [facility]!!   Trinidad Curet, RN [phone]  Any Other Special Instructions Will Be Listed Below (If Applicable).  Flecainide tablets What is this medicine? FLECAINIDE (FLEK a nide) is an antiarrhythmic drug. This medicine is used to prevent irregular heart rhythm. It can also slow down fast heartbeats called tachycardia. This medicine may be used for other purposes; ask your health care provider or pharmacist if you have questions. COMMON BRAND NAME(S): Tambocor What should I tell my health care provider before I take this medicine? They need to know if you have any of these conditions: -abnormal levels of potassium in the blood -heart disease including heart rhythm and heart rate problems -kidney or liver disease -recent heart attack -an unusual or allergic reaction to flecainide, local anesthetics, other medicines, foods, dyes, or preservatives -pregnant or trying to get pregnant -breast-feeding How should I use this medicine? Take this medicine by mouth with a glass of water. Follow the directions on the prescription label. You can take this medicine with or  without food. Take your doses at regular intervals. Do not take your medicine more often than directed. Do not stop taking this medicine suddenly. This may cause serious, heart-related side effects. If your doctor wants you to stop the medicine, the dose may be slowly lowered over time to avoid any side effects. Talk to your pediatrician regarding the use of this medicine in children. While this drug may be prescribed for children as young as 1 year of age for selected conditions, precautions do apply. Overdosage: If you think you have taken too much of this medicine contact a poison control center or emergency room at once. NOTE: This medicine is only for you. Do not share this medicine with others. What if I miss a dose? If you miss a dose, take it as soon as you can. If it is almost time for your next dose, take only that dose. Do not take double or extra doses. What may interact with this medicine? Do not take this medicine with any of the following medications: -amoxapine -arsenic trioxide -certain antibiotics like clarithromycin, erythromycin, gatifloxacin, gemifloxacin, levofloxacin, moxifloxacin, sparfloxacin, or troleandomycin -certain antidepressants called tricyclic antidepressants like amitriptyline, imipramine, or nortriptyline -certain medicines to control heart rhythm like disopyramide, dofetilide, encainide, moricizine, procainamide, propafenone, and quinidine -cisapride -cyclobenzaprine -delavirdine -droperidol -haloperidol -hawthorn -imatinib -levomethadyl -maprotiline -medicines for malaria like chloroquine and halofantrine -pentamidine -phenothiazines like chlorpromazine, mesoridazine, prochlorperazine, thioridazine -pimozide -quinine -ranolazine -ritonavir -sertindole -ziprasidone This medicine may also interact with the following medications: -cimetidine -medicines for angina or high blood pressure -medicines to control heart rhythm like amiodarone and  digoxin This list may not describe all possible interactions. Give your health care provider a list of all the  medicines, herbs, non-prescription drugs, or dietary supplements you use. Also tell them if you smoke, drink alcohol, or use illegal drugs. Some items may interact with your medicine. What should I watch for while using this medicine? Visit your doctor or health care professional for regular checks on your progress. Because your condition and the use of this medicine carries some risk, it is a good idea to carry an identification card, necklace or bracelet with details of your condition, medications and doctor or health care professional. Check your blood pressure and pulse rate regularly. Ask your health care professional what your blood pressure and pulse rate should be, and when you should contact him or her. Your doctor or health care professional also may schedule regular blood tests and electrocardiograms to check your progress. You may get drowsy or dizzy. Do not drive, use machinery, or do anything that needs mental alertness until you know how this medicine affects you. Do not stand or sit up quickly, especially if you are an older patient. This reduces the risk of dizzy or fainting spells. Alcohol can make you more dizzy, increase flushing and rapid heartbeats. Avoid alcoholic drinks. What side effects may I notice from receiving this medicine? Side effects that you should report to your doctor or health care professional as soon as possible: -chest pain, continued irregular heartbeats -difficulty breathing -swelling of the legs or feet -trembling, shaking -unusually weak or tired Side effects that usually do not require medical attention (report to your doctor or health care professional if they continue or are bothersome): -blurred vision -constipation -headache -nausea, vomiting -stomach pain This list may not describe all possible side effects. Call your doctor for medical  advice about side effects. You may report side effects to FDA at 1-800-FDA-1088. Where should I keep my medicine? Keep out of the reach of children. Store at room temperature between 15 and 30 degrees C (59 and 86 degrees F). Protect from light. Keep container tightly closed. Throw away any unused medicine after the expiration date. NOTE: This sheet is a summary. It may not cover all possible information. If you have questions about this medicine, talk to your doctor, pharmacist, or health care provider.  2018 Elsevier/Gold Standard (2008-02-06 16:46:09)    Exercise Stress Electrocardiogram An exercise stress electrocardiogram is a test that is done to evaluate the blood supply to your heart. This test may also be called exercise stress electrocardiography. The test is done while you are walking on a treadmill. The goal of this test is to raise your heart rate. This test is done to find areas of poor blood flow to the heart by determining the extent of coronary artery disease (CAD). CAD is defined as narrowing in one or more heart (coronary) arteries of more than 70%. If you have an abnormal test result, this may mean that you are not getting adequate blood flow to your heart during exercise. Additional testing may be needed to understand why your test was abnormal. Tell a health care provider about:  Any allergies you have.  All medicines you are taking, including vitamins, herbs, eye drops, creams, and over-the-counter medicines.  Any problems you or family members have had with anesthetic medicines.  Any blood disorders you have.  Any surgeries you have had.  Any medical conditions you have.  Possibility of pregnancy, if this applies. What are the risks? Generally, this is a safe procedure. However, as with any procedure, complications can occur. Possible complications can include:  Pain or pressure  in the following areas: ? Chest. ? Jaw or neck. ? Between your shoulder  blades. ? Radiating down your left arm.  Dizziness or light-headedness.  Shortness of breath.  Increased or irregular heartbeats.  Nausea or vomiting.  Heart attack (rare).  What happens before the procedure?  Avoid all forms of caffeine 24 hours before your test or as directed by your health care provider. This includes coffee, tea (even decaffeinated tea), caffeinated sodas, chocolate, cocoa, and certain pain medicines.  Follow your health care provider's instructions regarding eating and drinking before the test.  Take your medicines as directed at regular times with water unless instructed otherwise. Exceptions may include: ? If you have diabetes, ask how you are to take your insulin or pills. It is common to adjust insulin dosing the morning of the test. ? If you are taking beta-blocker medicines, it is important to talk to your health care provider about these medicines well before the date of your test. Taking beta-blocker medicines may interfere with the test. In some cases, these medicines need to be changed or stopped 24 hours or more before the test. ? If you wear a nitroglycerin patch, it may need to be removed prior to the test. Ask your health care provider if the patch should be removed before the test.  If you use an inhaler for any breathing condition, bring it with you to the test.  If you are an outpatient, bring a snack so you can eat right after the stress phase of the test.  Do not smoke for 4 hours prior to the test or as directed by your health care provider.  Do not apply lotions, powders, creams, or oils on your chest prior to the test.  Wear loose-fitting clothes and comfortable shoes for the test. This test involves walking on a treadmill. What happens during the procedure?  Multiple patches (electrodes) will be put on your chest. If needed, small areas of your chest may have to be shaved to get better contact with the electrodes. Once the electrodes are  attached to your body, multiple wires will be attached to the electrodes and your heart rate will be monitored.  Your heart will be monitored both at rest and while exercising.  You will walk on a treadmill. The treadmill will be started at a slow pace. The treadmill speed and incline will gradually be increased to raise your heart rate. What happens after the procedure?  Your heart rate and blood pressure will be monitored after the test.  You may return to your normal schedule including diet, activities, and medicines, unless your health care provider tells you otherwise. This information is not intended to replace advice given to you by your health care provider. Make sure you discuss any questions you have with your health care provider. Document Released: 09/30/2000 Document Revised: 03/10/2016 Document Reviewed: 06/10/2013 Elsevier Interactive Patient Education  2017 [employer].

## 2017-08-30 NOTE — Progress Notes (Addendum)
Electrophysiology Office Note   Date:  08/30/2017   ID:  Alexsys, Eskin 23-Jun-1946, MRN [id number]  PCP:  [name], MD  Cardiologist:  Johnsie Cancel Primary Electrophysiologist:  Raquel Racey Meredith Leeds, MD    Chief Complaint  Patient presents with  . Advice Only     History of Present Illness: GLORIAN MCDONELL is a 71 y.o. female who is being seen today for the evaluation of wide complex tachycardia at the request of [name], MD. Presenting today for electrophysiology evaluation.  He has a history of hypertension, hyperlipidemia, and chest pain.  He had a nuclear stress test that was low risk.  She wore a Holter monitor that showed salvos of wide complex tachycardia up to 19 beats.  Each run was monomorphic, but she did have multiple morphologies.  She also had episodes of SVT.  She presents today complaining of some vague weakness and fatigue.  She has had no chest pain and minimal palpitations.  Today, she denies symptoms of palpitations, chest pain, shortness of breath, orthopnea, PND, lower extremity edema, claudication, dizziness, presyncope, syncope, bleeding, or neurologic sequela. The patient is tolerating medications without difficulties.    Past Medical History:  Diagnosis Date  . GERD (gastroesophageal reflux disease)   . Glaucoma   . Hyperlipidemia   . Hypertension    Past Surgical History:  Procedure Laterality Date  . ABDOMINAL HYSTERECTOMY    . ANKLE SURGERY    . ROTATOR CUFF REPAIR Right      Current Outpatient Medications  Medication Sig Dispense Refill  . atorvastatin (LIPITOR) 20 MG tablet Take 20 mg by mouth daily.    . brimonidine-timolol (COMBIGAN) 0.2-0.5 % ophthalmic solution Place 1 drop into both eyes every 12 (twelve) hours.    . calcium citrate-vitamin D (CITRACAL+D) 315-200 MG-UNIT tablet Take 1 tablet by mouth 2 (two) times daily.    . cetirizine (ZYRTEC) 10 MG tablet Take 10 mg by mouth daily.    . Cholecalciferol (VITAMIN  D-1000 MAX ST) 1000 units tablet Take 1,000 Units by mouth daily.    Marland Kitchen co-enzyme Q-10 30 MG capsule Take 30 mg by mouth 3 (three) times daily.    Marland Kitchen dexlansoprazole (DEXILANT) 60 MG capsule Take 60 mg by mouth daily.    . fluticasone (FLONASE) 50 MCG/ACT nasal spray Place 1 spray into both nostrils daily.    . furosemide (LASIX) 20 MG tablet Take 1 tablet (20 mg total) by mouth daily at 2 PM. 90 tablet 3  . montelukast (SINGULAIR) 10 MG tablet Take 10 mg by mouth daily.    . Multiple Vitamin (MULTIVITAMIN) tablet Take 1 tablet by mouth daily.    Marland Kitchen omega-3 acid ethyl esters (LOVAZA) 1 g capsule Take 1 g by mouth daily.    . quinapril (ACCUPRIL) 20 MG tablet Take 1 tablet (20 mg total) by mouth every morning. 90 tablet 3  . diltiazem (CARDIZEM CD) 180 MG 24 hr capsule Take 1 capsule (180 mg total) daily by mouth. 90 capsule 1  . flecainide (TAMBOCOR) 100 MG tablet Take 1 tablet (100 mg total) 2 (two) times daily by mouth. 60 tablet 3   No current facility-administered medications for this visit.     Allergies:   Augmentin [amoxicillin-pot clavulanate] and Oxycodone hcl   Social History:  The patient  reports that she has quit smoking. she has never used smokeless tobacco. She reports that she drinks alcohol. She reports that she does not use drugs.  Family History:  The patient's family history includes Hypertension in her mother.    ROS:  Please see the history of present illness.   Otherwise, review of systems is positive for none.   All other systems are reviewed and negative.    PHYSICAL EXAM: VS:  BP 122/80   Pulse (!) 49   Ht 5\' 3"  (1.6 m)   Wt 158 lb 9.6 oz (71.9 kg)   BMI 28.09 kg/m  , BMI Body mass index is 28.09 kg/m. GEN: Well nourished, well developed, in no acute distress  HEENT: normal  Neck: no JVD, carotid bruits, or masses Cardiac: RRR; no murmurs, rubs, or gallops,no edema  Respiratory:  clear to auscultation bilaterally, normal work of breathing GI: soft,  nontender, nondistended, + BS MS: no deformity or atrophy  Skin: warm and dry Neuro:  Strength and sensation are intact Psych: euthymic mood, full affect  EKG:  EKG is ordered today. Personal review of the ekg ordered shows sinus rhythm, rate 49  Recent Labs: 03/18/2017: BUN 17; Creatinine, Ser 0.90; Hemoglobin 14.9; Platelets 151; Potassium 4.3; Sodium 138    Lipid Panel     Component Value Date/Time   CHOL 151 08/22/2008 1042   TRIG 120 08/22/2008 1042   HDL 38.2 (L) 08/22/2008 1042   CHOLHDL 4.0 CALC 08/22/2008 1042   VLDL 24 08/22/2008 1042   LDLCALC 89 08/22/2008 1042     Wt Readings from Last 3 Encounters:  08/30/17 158 lb 9.6 oz (71.9 kg)  07/20/17 158 lb (71.7 kg)  06/01/17 158 lb (71.7 kg)      Other studies Reviewed: Additional studies/ records that were reviewed today include: Stress echo 07/03/17  Review of the above records today demonstrates:  - Stress ECG conclusions: The stress ECG was normal. Duke scoring:   exercise time of 6.5 min; maximum ST deviation of 0 mm; no   angina; resulting score is 7. This score predicts a low risk of   cardiac events. - Staged echo: This study is inadequate for the diagnostic   evaluation of left ventricular regional wall motion. - Impressions: Due to very frequent ventricular bigeminy and   salvos, images are of poor quality and cannot rule out wall   motion abnormality with stress. Recommend coronary CTA for   further assessment of calcium score and CAD. May also benefit   from 24 hour Holter monitor to assess PVC load.  07/20/17 Spect  Nuclear stress EF: 72%. The left ventricular ejection fraction is hyperdynamic (>65%).  The study is normal.  This is a low risk study.  Holter 07/26/17 Minimum HR: 41 BPM at 9:56:17 AM(2) Maximum HR: 145 BPM at 10:09:39 AM Average HR: 61 BPM <1% PVCs <1% APCs Multiple runs of wide complex tachycardia Short runs of narrow complex tachycardia  ASSESSMENT AND PLAN:  1.   Ventricular tachycardia: Found on monitor.  She also had quite a bit of ventricular ectopy during her stress echo.  She is minimally symptomatic, but has had some vague fatigue and palpitations.  Fortunately, her ejection fraction is normal and she had no ischemia on her nuclear stress test.  She would be able to tolerate all antiarrhythmic medications.  Due to that, we Ellysia Char plan to start her on flecainide 100 mg twice a day.  I told her to hold off on exercising until after her GXT.  2.  Hypertension: Well-controlled today.  3.  Hyperlipidemia: Continue atorvastatin.    Current medicines are reviewed at length with the  patient today.   The patient does not have concerns regarding her medicines.  The following changes were made today: Decrease diltiazem, start flecainide  Labs/ tests ordered today include:  Orders Placed This Encounter  Procedures  . Exercise Tolerance Test  . EKG 12-Lead     Disposition:   FU with Sharica Roedel 3 months  Signed, Joesphine Schemm Meredith Leeds, MD  08/30/2017 10:28 AM     [facility] [address] [place] [address] [phone] (office) [phone] (fax)

## 2017-09-04 ENCOUNTER — Ambulatory Visit: Payer: PRIVATE HEALTH INSURANCE | Admitting: Cardiovascular Disease

## 2017-09-13 ENCOUNTER — Ambulatory Visit (INDEPENDENT_AMBULATORY_CARE_PROVIDER_SITE_OTHER): Payer: PRIVATE HEALTH INSURANCE

## 2017-09-13 DIAGNOSIS — R000 Tachycardia, unspecified: Secondary | ICD-10-CM

## 2017-09-13 DIAGNOSIS — I472 Ventricular tachycardia: Secondary | ICD-10-CM

## 2017-09-13 LAB — EXERCISE TOLERANCE TEST
CHL CUP MPHR: 151 {beats}/min
CSEPED: 6 min
CSEPEDS: 32 s
CSEPHR: 93 %
Estimated workload: 7.8 METS
Peak HR: 141 {beats}/min
RPE: 19
Rest HR: 52 {beats}/min

## 2017-10-17 HISTORY — PX: BREAST LUMPECTOMY: SHX2

## 2017-10-25 ENCOUNTER — Telehealth: Payer: Self-pay | Admitting: Cardiology

## 2017-10-25 NOTE — Telephone Encounter (Signed)
New message   STAT if patient feels like he/she is going to faint   1) Are you dizzy now? Yes    2) Do you feel faint or have you passed out? No   3) Do you have any other symptoms? Dizziness when bending over or head movement   4) Have you checked your HR and BP (record if available)? Yes  HR: 43  BP: 121/70

## 2017-10-25 NOTE — Telephone Encounter (Signed)
I spoke with pt.  She reports dizziness which started Monday evening after exercise class.  This was first class after recent treadmill. Dizziness continued yesterday and is ongoing today.  Dizziness occurs when she moves head or bends over. Was getting out of shower yesterday and while turning head became dizzy and fell.  Did not pass out.  Was not injured. Had played golf on Sunday and was bending over frequently and did not have symptoms at that time. She is not having any sinus issues or congestion. No new medications. She reports the following readings- Tues AM-121/72,43 before AM medications. 123/67, 43 about 2 hours after morning medications. Today-126/73,46 and [id number]. Prior to this she did not check vital signs on a regular basis but does have the following readings 11/24--110/65,45 12/9-114/68,55 12/16-127/71,60. Heart rate at last office visit was 49.  She is not having any palpitations.  Is taking flecainide, cardizem and quinapril as listed.  I told pt I would forward to Dr. Curt Bears and we would call her with his recommendations.

## 2017-10-26 MED ORDER — FLECAINIDE ACETATE 100 MG PO TABS: 100.0000 mg | ORAL_TABLET | Freq: Two times a day (BID) | ORAL | 6 refills | Status: DC

## 2017-10-26 MED ORDER — DILTIAZEM HCL ER COATED BEADS 120 MG PO CP24: 120.0000 mg | ORAL_CAPSULE | Freq: Every day | ORAL | 6 refills | Status: DC

## 2017-10-26 NOTE — Telephone Encounter (Signed)
Decrease diltiazem to 120 mg daily. May need alternate form of rate control in the future if this does not improve symptoms.

## 2017-10-26 NOTE — Telephone Encounter (Signed)
BP 132/77, HR 51 Not as dizzy today as yesterday, b/c HR has come up. Advised pt to decrease Diltiazem to 120 mg daily. She will call me next week to let me know how she is doing. Patient verbalized understanding and agreeable to plan.

## 2017-10-30 ENCOUNTER — Telehealth: Payer: Self-pay | Admitting: Cardiology

## 2017-10-30 MED ORDER — METOPROLOL TARTRATE 25 MG PO TABS: 12.5000 mg | ORAL_TABLET | Freq: Two times a day (BID) | ORAL | 3 refills | Status: DC

## 2017-10-30 NOTE — Telephone Encounter (Signed)
Stop diltiazem and start metoprolol 12.5 mg BID.

## 2017-10-30 NOTE — Telephone Encounter (Signed)
New message  Patient calling with concerns of dizziness. Please call   STAT if patient feels like he/she is going to faint   1) Are you dizzy now? yes  2) Do you feel faint or have you passed out? No  3) Do you have any other symptoms? Unable to bend over from dizziness, Lightheaded  4) Have you checked your HR and BP (record if available)? 101/60 HR 42, 92/59 HR 46, 112/66 HR40

## 2017-10-30 NOTE — Telephone Encounter (Signed)
Called patient back. Patient complaining of dizziness. Patient complaining of HR being low. Asked patient if she has had any caffiene today. Patient stated she has one cup of coffee daily and she just had some green tea. Asked patient to check her HR since she had some green tea. Patient stated her BP 140/85 HR 50 right now and she is not feeling dizzy at the moment. Patient stated she started having dizziness after a yoga class on Tuesday of last week. Patient stated she started feeling better on Friday and then on Saturday started having dizziness again. Patient stated her BP and HR readings were taken before her morning medications and some shortly after her morning medications. (BP 101/60 HR42, BP 92/59 HR 46, and BP 112/66 HR 40) Informed patient that her message would be sent to Dr. Curt Bears for advisement. Patient verbalized understanding.

## 2017-10-30 NOTE — Telephone Encounter (Signed)
Called patient with Dr. Macky Lower recommendations. Per Dr. Curt Bears, stop diltiazem and start metoprolol 12.5 mg by mouth twice daily. Patient verbalized understanding.

## 2017-11-30 NOTE — Progress Notes (Signed)
Electrophysiology Office Note   Date:  12/01/2017   ID:  Eshika, Reckart October 21, 1945, MRN [id number]  PCP:  [name], MD  Cardiologist:  Johnsie Cancel Primary Electrophysiologist:  Lady Wisham Meredith Leeds, MD    Chief Complaint  Patient presents with  . Follow-up    Wide-complex tachycardia     History of Present Illness: Andrea Clarke is a 72 y.o. female who is being seen today for the evaluation of wide complex tachycardia at the request of [name], MD. Presenting today for electrophysiology evaluation.  He has a history of hypertension, hyperlipidemia, and chest pain.  He had a nuclear stress test that was low risk.  She wore a Holter monitor that showed salvos of wide complex tachycardia up to 19 beats.  Each run was monomorphic, but she did have multiple morphologies.  She also had episodes of SVT.    Today, denies symptoms of palpitations, chest pain, shortness of breath, orthopnea, PND, lower extremity edema, claudication, dizziness, presyncope, syncope, bleeding, or neurologic sequela. The patient is tolerating medications without difficulties.  Her main complaint today is fatigue.  He was started on metoprolol and flecainide at her last visit.  She is not able to do her daily exercises due to her fatigue.  Otherwise she is doing well without complaint.  She has noted no further palpitations.  Past Medical History:  Diagnosis Date  . GERD (gastroesophageal reflux disease)   . Glaucoma   . Hyperlipidemia   . Hypertension    Past Surgical History:  Procedure Laterality Date  . ABDOMINAL HYSTERECTOMY    . ANKLE SURGERY    . ROTATOR CUFF REPAIR Right      Current Outpatient Medications  Medication Sig Dispense Refill  . atorvastatin (LIPITOR) 20 MG tablet Take 20 mg by mouth daily.    . brimonidine-timolol (COMBIGAN) 0.2-0.5 % ophthalmic solution Place 1 drop into both eyes every 12 (twelve) hours.    . calcium citrate-vitamin D (CITRACAL+D) 315-200  MG-UNIT tablet Take 1 tablet by mouth 2 (two) times daily.    . cetirizine (ZYRTEC) 10 MG tablet Take 10 mg by mouth daily.    . Cholecalciferol (VITAMIN D-1000 MAX ST) 1000 units tablet Take 1,000 Units by mouth daily.    Marland Kitchen co-enzyme Q-10 30 MG capsule Take 30 mg by mouth 3 (three) times daily.    Marland Kitchen dexlansoprazole (DEXILANT) 60 MG capsule Take 60 mg by mouth daily.    . flecainide (TAMBOCOR) 100 MG tablet Take 1 tablet (100 mg total) by mouth 2 (two) times daily. 60 tablet 6  . fluticasone (FLONASE) 50 MCG/ACT nasal spray Place 1 spray into both nostrils daily.    . montelukast (SINGULAIR) 10 MG tablet Take 10 mg by mouth daily.    . Multiple Vitamin (MULTIVITAMIN) tablet Take 1 tablet by mouth daily.    Marland Kitchen omega-3 acid ethyl esters (LOVAZA) 1 g capsule Take 1 g by mouth daily.    . quinapril (ACCUPRIL) 20 MG tablet Take 1 tablet (20 mg total) by mouth every morning. 90 tablet 3  . diltiazem (CARDIZEM CD) 120 MG 24 hr capsule Take 1 capsule (120 mg total) by mouth daily. 90 capsule 3  . furosemide (LASIX) 20 MG tablet Take 1 tablet (20 mg total) by mouth daily at 2 PM. 90 tablet 3   No current facility-administered medications for this visit.     Allergies:   Augmentin [amoxicillin-pot clavulanate] and Oxycodone hcl   Social History:  The  patient  reports that she has quit smoking. she has never used smokeless tobacco. She reports that she drinks alcohol. She reports that she does not use drugs.   Family History:  The patient's family history includes Hypertension in her mother.   ROS:  Please see the history of present illness.   Otherwise, review of systems is positive for leg pain, cough, snoring, dizziness, headaches.   All other systems are reviewed and negative.   PHYSICAL EXAM: VS:  BP (!) 142/96   Pulse (!) 45   Ht 5\' 3"  (1.6 m)   Wt 160 lb 3.2 oz (72.7 kg)   BMI 28.38 kg/m  , BMI Body mass index is 28.38 kg/m. GEN: Well nourished, well developed, in no acute distress    HEENT: normal  Neck: no JVD, carotid bruits, or masses Cardiac: RRR; no murmurs, rubs, or gallops,no edema  Respiratory:  clear to auscultation bilaterally, normal work of breathing GI: soft, nontender, nondistended, + BS MS: no deformity or atrophy  Skin: warm and dry Neuro:  Strength and sensation are intact Psych: euthymic mood, full affect  EKG:  EKG is ordered today. Personal review of the ekg ordered shows SR, LAD, low voltage, rate 45   Recent Labs: 03/18/2017: BUN 17; Creatinine, Ser 0.90; Hemoglobin 14.9; Platelets 151; Potassium 4.3; Sodium 138    Lipid Panel     Component Value Date/Time   CHOL 151 08/22/2008 1042   TRIG 120 08/22/2008 1042   HDL 38.2 (L) 08/22/2008 1042   CHOLHDL 4.0 CALC 08/22/2008 1042   VLDL 24 08/22/2008 1042   LDLCALC 89 08/22/2008 1042     Wt Readings from Last 3 Encounters:  12/01/17 160 lb 3.2 oz (72.7 kg)  08/30/17 158 lb 9.6 oz (71.9 kg)  07/20/17 158 lb (71.7 kg)      Other studies Reviewed: Additional studies/ records that were reviewed today include: Stress echo 07/03/17  Review of the above records today demonstrates:  - Stress ECG conclusions: The stress ECG was normal. Duke scoring:   exercise time of 6.5 min; maximum ST deviation of 0 mm; no   angina; resulting score is 7. This score predicts a low risk of   cardiac events. - Staged echo: This study is inadequate for the diagnostic   evaluation of left ventricular regional wall motion. - Impressions: Due to very frequent ventricular bigeminy and   salvos, images are of poor quality and cannot rule out wall   motion abnormality with stress. Recommend coronary CTA for   further assessment of calcium score and CAD. May also benefit   from 24 hour Holter monitor to assess PVC load.  07/20/17 Spect  Nuclear stress EF: 72%. The left ventricular ejection fraction is hyperdynamic (>65%).  The study is normal.  This is a low risk study.  Holter 07/26/17 Minimum HR: 41 BPM  at 9:56:17 AM(2) Maximum HR: 145 BPM at 10:09:39 AM Average HR: 61 BPM <1% PVCs <1% APCs Multiple runs of wide complex tachycardia Short runs of narrow complex tachycardia  ETT 09/13/17 -personally reviewed  Blood pressure demonstrated a normal response to exercise.  There was no ST segment deviation noted during stress.   No ischemia Rare PVC with exercise Slight QRS widening during stress on flecainide  ASSESSMENT AND PLAN:  1.  Ventricular tachycardia: She has not noted any further episodes of ventricular tachycardia on her current dose of flecainide.  Her GXT showed no major abnormality.  We Rainy Rothman continue flecainide.  She is  having some fatigue.  We Mardene Lessig stop her metoprolol and start her on diltiazem 120 mg.    2.  Hypertension: Currently well controlled.  No changes.  3.  Hyperlipidemia: Continue atorvastatin    Current medicines are reviewed at length with the patient today.   The patient does not have concerns regarding her medicines.  The following changes were made today: None  Labs/ tests ordered today include:  Orders Placed This Encounter  Procedures  . EKG 12-Lead     Disposition:   FU with Zaveon Gillen 6 months  Signed, Madysun Thall Meredith Leeds, MD  12/01/2017 10:09 AM     [facility] [address] [place] [address] [phone] (office) [phone] (fax)

## 2017-12-01 ENCOUNTER — Encounter: Payer: Self-pay | Admitting: Cardiology

## 2017-12-01 ENCOUNTER — Ambulatory Visit (INDEPENDENT_AMBULATORY_CARE_PROVIDER_SITE_OTHER): Payer: PRIVATE HEALTH INSURANCE | Admitting: Cardiology

## 2017-12-01 VITALS — BP 142/96 | HR 45 | Ht 63.0 in | Wt 160.2 lb

## 2017-12-01 DIAGNOSIS — I472 Ventricular tachycardia, unspecified: Secondary | ICD-10-CM

## 2017-12-01 DIAGNOSIS — I10 Essential (primary) hypertension: Secondary | ICD-10-CM

## 2017-12-01 DIAGNOSIS — E785 Hyperlipidemia, unspecified: Secondary | ICD-10-CM

## 2017-12-01 MED ORDER — DILTIAZEM HCL ER COATED BEADS 120 MG PO CP24: 120.0000 mg | ORAL_CAPSULE | Freq: Every day | ORAL | 3 refills | Status: DC

## 2017-12-01 MED ORDER — FLECAINIDE ACETATE 100 MG PO TABS: 100.0000 mg | ORAL_TABLET | Freq: Two times a day (BID) | ORAL | 6 refills | Status: DC

## 2017-12-01 NOTE — Patient Instructions (Addendum)
Medication Instructions:  Your physician has recommended you make the following change in your medication:  1. STOP Metoprolol 2. START Diltiazem 120 mg once daily  * If you need a refill on your cardiac medications before your next appointment, please call your pharmacy. *  Labwork: None ordered  Testing/Procedures: None ordered  Follow-Up: Your physician wants you to follow-up in: 6 months with Dr. Curt Bears.  You will receive a reminder letter in the mail two months in advance. If you don't receive a letter, please call our office to schedule the follow-up appointment.  Thank you for choosing [facility]!!   Trinidad Curet, RN [phone]  Any Other Special Instructions Will Be Listed Below (If Applicable).  Diltiazem extended-release capsules or tablets What is this medicine? DILTIAZEM (dil TYE a zem) is a calcium-channel blocker. It affects the amount of calcium found in your heart and muscle cells. This relaxes your blood vessels, which can reduce the amount of work the heart has to do. This medicine is used to treat high blood pressure and chest pain caused by angina. This medicine may be used for other purposes; ask your health care provider or pharmacist if you have questions. COMMON BRAND NAME(S): Cardizem CD, Cardizem LA, Cardizem SR, Cartia XT, Dilacor XR, Dilt-CD, Diltia XT, Diltzac, Matzim LA, Rema Fendt, Tiamate, Tiazac What should I tell my health care provider before I take this medicine? They need to know if you have any of these conditions: -heart problems, low blood pressure, irregular heartbeat -liver disease -previous heart attack -an unusual or allergic reaction to diltiazem, other medicines, foods, dyes, or preservatives -pregnant or trying to get pregnant -breast-feeding How should I use this medicine? Take this medicine by mouth with a glass of water. Follow the directions on the prescription label. Swallow whole, do not crush or chew. Ask your doctor  or pharmacist if your should take this medicine with food. Take your doses at regular intervals. Do not take your medicine more often then directed. Do not stop taking except on the advice of your doctor or health care professional. Ask your doctor or health care professional how to gradually reduce the dose. Talk to your pediatrician regarding the use of this medicine in children. Special care may be needed. Overdosage: If you think you have taken too much of this medicine contact a poison control center or emergency room at once. NOTE: This medicine is only for you. Do not share this medicine with others. What if I miss a dose? If you miss a dose, take it as soon as you can. If it is almost time for your next dose, take only that dose. Do not take double or extra doses. What may interact with this medicine? Do not take this medicine with any of the following medications: -cisapride -hawthorn -pimozide -ranolazine -red yeast rice This medicine may also interact with the following medications: -buspirone -carbamazepine -cimetidine -cyclosporine -digoxin -local anesthetics or general anesthetics -lovastatin -medicines for anxiety or difficulty sleeping like midazolam and triazolam -medicines for high blood pressure or heart problems -quinidine -rifampin, rifabutin, or rifapentine This list may not describe all possible interactions. Give your health care provider a list of all the medicines, herbs, non-prescription drugs, or dietary supplements you use. Also tell them if you smoke, drink alcohol, or use illegal drugs. Some items may interact with your medicine. What should I watch for while using this medicine? Check your blood pressure and pulse rate regularly. Ask your doctor or health care professional what  your blood pressure and pulse rate should be and when you should contact him or her. You may feel dizzy or lightheaded. Do not drive, use machinery, or do anything that needs mental  alertness until you know how this medicine affects you. To reduce the risk of dizzy or fainting spells, do not sit or stand up quickly, especially if you are an older patient. Alcohol can make you more dizzy or increase flushing and rapid heartbeats. Avoid alcoholic drinks. What side effects may I notice from receiving this medicine? Side effects that you should report to your doctor or health care professional as soon as possible: -allergic reactions like skin rash, itching or hives, swelling of the face, lips, or tongue -confusion, mental depression -feeling faint or lightheaded, falls -redness, blistering, peeling or loosening of the skin, including inside the mouth -slow, irregular heartbeat -swelling of the feet and ankles -unusual bleeding or bruising, pinpoint red spots on the skin Side effects that usually do not require medical attention (report to your doctor or health care professional if they continue or are bothersome): -constipation or diarrhea -difficulty sleeping -facial flushing -headache -nausea, vomiting -sexual dysfunction -weak or tired This list may not describe all possible side effects. Call your doctor for medical advice about side effects. You may report side effects to FDA at 1-800-FDA-1088. Where should I keep my medicine? Keep out of the reach of children. Store at room temperature between 15 and 30 degrees C (59 and 86 degrees F). Protect from humidity. Throw away any unused medicine after the expiration date. NOTE: This sheet is a summary. It may not cover all possible information. If you have questions about this medicine, talk to your doctor, pharmacist, or health care provider.  2018 Elsevier/Gold Standard (2008-01-24 14:35:47)

## 2018-03-25 ENCOUNTER — Other Ambulatory Visit: Payer: Self-pay | Admitting: Cardiology

## 2018-06-19 ENCOUNTER — Other Ambulatory Visit: Payer: Self-pay | Admitting: Cardiology

## 2018-08-06 ENCOUNTER — Other Ambulatory Visit (HOSPITAL_BASED_OUTPATIENT_CLINIC_OR_DEPARTMENT_OTHER): Payer: Self-pay | Admitting: Internal Medicine

## 2018-08-06 DIAGNOSIS — Z1231 Encounter for screening mammogram for malignant neoplasm of breast: Secondary | ICD-10-CM

## 2018-08-07 ENCOUNTER — Ambulatory Visit (HOSPITAL_BASED_OUTPATIENT_CLINIC_OR_DEPARTMENT_OTHER)
Admission: RE | Admit: 2018-08-07 | Discharge: 2018-08-07 | Disposition: A | Discharge status: Home / Self Care | Payer: PRIVATE HEALTH INSURANCE | Source: Ambulatory Visit | Attending: Internal Medicine | Admitting: Internal Medicine

## 2018-08-07 DIAGNOSIS — Z1231 Encounter for screening mammogram for malignant neoplasm of breast: Secondary | ICD-10-CM

## 2018-08-08 ENCOUNTER — Other Ambulatory Visit: Payer: Self-pay | Admitting: Internal Medicine

## 2018-08-08 ENCOUNTER — Ambulatory Visit (HOSPITAL_BASED_OUTPATIENT_CLINIC_OR_DEPARTMENT_OTHER): Payer: PRIVATE HEALTH INSURANCE

## 2018-08-08 DIAGNOSIS — R928 Other abnormal and inconclusive findings on diagnostic imaging of breast: Secondary | ICD-10-CM

## 2018-08-15 ENCOUNTER — Ambulatory Visit
Admission: RE | Admit: 2018-08-15 | Discharge: 2018-08-15 | Disposition: A | Discharge status: Home / Self Care | Payer: PRIVATE HEALTH INSURANCE | Source: Ambulatory Visit | Attending: Internal Medicine | Admitting: Internal Medicine

## 2018-08-15 ENCOUNTER — Other Ambulatory Visit: Payer: Self-pay | Admitting: Internal Medicine

## 2018-08-15 DIAGNOSIS — R928 Other abnormal and inconclusive findings on diagnostic imaging of breast: Secondary | ICD-10-CM

## 2018-08-15 DIAGNOSIS — N6320 Unspecified lump in the left breast, unspecified quadrant: Secondary | ICD-10-CM

## 2018-08-17 ENCOUNTER — Ambulatory Visit
Admission: RE | Admit: 2018-08-17 | Discharge: 2018-08-17 | Disposition: A | Discharge status: Home / Self Care | Payer: PRIVATE HEALTH INSURANCE | Source: Ambulatory Visit | Attending: Internal Medicine | Admitting: Internal Medicine

## 2018-08-17 DIAGNOSIS — N6320 Unspecified lump in the left breast, unspecified quadrant: Secondary | ICD-10-CM

## 2018-08-22 ENCOUNTER — Telehealth: Payer: Self-pay | Admitting: Hematology and Oncology

## 2018-08-22 ENCOUNTER — Other Ambulatory Visit: Payer: Self-pay | Admitting: General Surgery

## 2018-08-22 DIAGNOSIS — C50312 Malignant neoplasm of lower-inner quadrant of left female breast: Secondary | ICD-10-CM

## 2018-08-22 DIAGNOSIS — Z171 Estrogen receptor negative status [ER-]: Principal | ICD-10-CM

## 2018-08-22 NOTE — Progress Notes (Unsigned)
Mri

## 2018-08-22 NOTE — Telephone Encounter (Signed)
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 Pt's daughter, [name], has been cld and scheduled her mom to see Dr. Lindi Adie on 11/12 at 1pm. Mrs. Andrea Clarke is aware her mom should arrive 30 minutes early to be checked in on time.

## 2018-08-28 ENCOUNTER — Other Ambulatory Visit: Payer: Self-pay | Admitting: *Deleted

## 2018-08-28 ENCOUNTER — Ambulatory Visit (HOSPITAL_COMMUNITY)
Admission: RE | Admit: 2018-08-28 | Discharge: 2018-08-28 | Disposition: A | Discharge status: Home / Self Care | Payer: PRIVATE HEALTH INSURANCE | Source: Ambulatory Visit | Attending: General Surgery | Admitting: General Surgery

## 2018-08-28 ENCOUNTER — Inpatient Hospital Stay: Payer: PRIVATE HEALTH INSURANCE | Attending: Hematology and Oncology | Admitting: Hematology and Oncology

## 2018-08-28 ENCOUNTER — Encounter: Payer: Self-pay | Admitting: *Deleted

## 2018-08-28 ENCOUNTER — Other Ambulatory Visit: Payer: Self-pay | Admitting: Hematology and Oncology

## 2018-08-28 ENCOUNTER — Telehealth: Payer: Self-pay | Admitting: Hematology and Oncology

## 2018-08-28 ENCOUNTER — Telehealth: Payer: Self-pay

## 2018-08-28 VITALS — BP 158/84 | HR 63 | Temp 97.9°F | Resp 16 | Ht 63.0 in | Wt 166.0 lb

## 2018-08-28 DIAGNOSIS — I10 Essential (primary) hypertension: Secondary | ICD-10-CM | POA: Diagnosis not present

## 2018-08-28 DIAGNOSIS — Z5112 Encounter for antineoplastic immunotherapy: Secondary | ICD-10-CM | POA: Diagnosis present

## 2018-08-28 DIAGNOSIS — C50212 Malignant neoplasm of upper-inner quadrant of left female breast: Secondary | ICD-10-CM | POA: Diagnosis not present

## 2018-08-28 DIAGNOSIS — Z171 Estrogen receptor negative status [ER-]: Secondary | ICD-10-CM | POA: Insufficient documentation

## 2018-08-28 DIAGNOSIS — Z5111 Encounter for antineoplastic chemotherapy: Secondary | ICD-10-CM | POA: Insufficient documentation

## 2018-08-28 DIAGNOSIS — C50312 Malignant neoplasm of lower-inner quadrant of left female breast: Secondary | ICD-10-CM | POA: Insufficient documentation

## 2018-08-28 LAB — POCT I-STAT CREATININE: CREATININE: 0.6 mg/dL (ref 0.44–1.00)

## 2018-08-28 MED ORDER — GADOBUTROL 1 MMOL/ML IV SOLN
7.5000 mL | Freq: Once | INTRAVENOUS | Status: AC | PRN
  Administered 2018-08-28: 7 mL via INTRAVENOUS

## 2018-08-28 MED ORDER — LORAZEPAM 0.5 MG PO TABS: 0.5000 mg | ORAL_TABLET | Freq: Every evening | ORAL | 0 refills | Status: DC | PRN

## 2018-08-28 MED ORDER — LIDOCAINE-PRILOCAINE 2.5-2.5 % EX CREA: TOPICAL_CREAM | CUTANEOUS | 3 refills | Status: DC

## 2018-08-28 MED ORDER — PROCHLORPERAZINE MALEATE 10 MG PO TABS: 10.0000 mg | ORAL_TABLET | Freq: Four times a day (QID) | ORAL | 1 refills | Status: DC | PRN

## 2018-08-28 MED ORDER — ONDANSETRON HCL 8 MG PO TABS: 8.0000 mg | ORAL_TABLET | Freq: Two times a day (BID) | ORAL | 1 refills | Status: DC | PRN

## 2018-08-28 NOTE — Progress Notes (Signed)
[place] NOTE  Patient Care Team: [name], MD as PCP - General (Internal Medicine)   CHIEF COMPLAINTS/PURPOSE OF CONSULTATION:  Newly Diagnosed Left Breast Cancer  HISTORY OF PRESENTING ILLNESS:  Andrea Clarke 72 y.o. female is here because of recent diagnosis of left breast cancer. The cancer was detected by routine screening mammogram and the mass was not palpable prior to diagnose.  She had a diagnostic mammogram on 08/15/18 of the left breast which showed a 1.3x0.8x1.1cm mass at the 10:00 position, 7cm from the nipple. She underwent subsequent breast biopsy on 08/17/18 which showed evidence of invasive ductal carcinoma with lymphovascular involvement, Grade III, ER/PR negative, HER2 positive, stage 1a.  She presents to the clinic today by her daughter. She met with Dr. Donne Hazel last week to review her pathology report. She had a b/l breast MRI today and the official report is still pending but no obvious findings seen outside of the primary tumor.  After detailed discussion she agreed to proceed with neoadjuvant chemo and the treatment plan as discussed. Prior to start of treatment she would get ECHO, chemo education class and PAC placement. She is emotional about her diagnosis and extensive treatment process but still wants to proceed.  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 She does travel for her job to [place], but mostly she is local. She would like to continue work. While visiting clients she is concerned about her hair loss. She would like to use the cold cap during infusion. She does plan to go to [place] on the 21st.   I reviewed her records extensively and collaborated the history with the patient.   SUMMARY OF ONCOLOGIC HISTORY:   Malignant neoplasm of upper-inner quadrant of left breast in female, estrogen receptor negative ([place])   08/17/2018 Initial Diagnosis    Screening mammogram detected abnormality in the  left breast by ultrasound irregular mass 10:00 position 1.3 cm, 10 o'clock position no enlarged lymph nodes, ER 0%, PR 0%, Ki-67 80%, HER-2 +3+ by [facility]    08/28/2018 Cancer Staging    Staging form: Breast, AJCC 8th Edition - Clinical stage from 08/28/2018: Stage IA (cT1c, cN0, cM0, G3, ER-, PR-, HER2+) - Signed by Nicholas Lose, MD on 08/28/2018    08/28/2018 -  Chemotherapy    The patient had trastuzumab (HERCEPTIN) 294 mg in sodium chloride 0.9 % 250 mL chemo infusion, 4 mg/kg = 294 mg, Intravenous,  Once, 0 of 3 cycles PACLitaxel (TAXOL) 144 mg in sodium chloride 0.9 % 250 mL chemo infusion (</= 18m/m2), 80 mg/m2 = 144 mg, Intravenous,  Once, 0 of 3 cycles  for chemotherapy treatment.       MEDICAL HISTORY:  Past Medical History:  Diagnosis Date  . GERD (gastroesophageal reflux disease)   . Glaucoma   . Hyperlipidemia   . Hypertension     SURGICAL HISTORY: Past Surgical History:  Procedure Laterality Date  . ABDOMINAL HYSTERECTOMY    . ANKLE SURGERY    . ROTATOR CUFF REPAIR Right     SOCIAL HISTORY: Social History   Socioeconomic History  . Marital status: Married    Spouse name: Not on file  . Number of children: Not on file  . Years of education: Not on file  . Highest education level: Not on file  Occupational History  . Not on file  Social Needs  . Financial resource strain: Not on file  . Food insecurity:    Worry: Not  on file    Inability: Not on file  . Transportation needs:    Medical: Not on file    Non-medical: Not on file  Tobacco Use  . Smoking status: Former Research scientist (life sciences)  . Smokeless tobacco: Never Used  Substance and Sexual Activity  . Alcohol use: Yes    Comment: daily  . Drug use: No  . Sexual activity: Not on file  Lifestyle  . Physical activity:    Days per week: Not on file    Minutes per session: Not on file  . Stress: Not on file  Relationships  . Social connections:    Talks on phone: Not on file    Gets together: Not on file     Attends religious service: Not on file    Active member of club or organization: Not on file    Attends meetings of clubs or organizations: Not on file    Relationship status: Not on file  . Intimate partner violence:    Fear of current or ex partner: Not on file    Emotionally abused: Not on file    Physically abused: Not on file    Forced sexual activity: Not on file  Other Topics Concern  . Not on file  Social History Narrative  . Not on file    FAMILY HISTORY: Family History  Problem Relation Age of Onset  . Hypertension Mother   . Breast cancer Neg Hx     ALLERGIES:  is allergic to augmentin [amoxicillin-pot clavulanate] and oxycodone hcl.  MEDICATIONS:  Current Outpatient Medications  Medication Sig Dispense Refill  . atorvastatin (LIPITOR) 20 MG tablet Take 20 mg by mouth daily.    . brimonidine-timolol (COMBIGAN) 0.2-0.5 % ophthalmic solution Place 1 drop into both eyes every 12 (twelve) hours.    . calcium citrate-vitamin D (CITRACAL+D) 315-200 MG-UNIT tablet Take 1 tablet by mouth 2 (two) times daily.    . cetirizine (ZYRTEC) 10 MG tablet Take 10 mg by mouth daily.    . Cholecalciferol (VITAMIN D-1000 MAX ST) 1000 units tablet Take 1,000 Units by mouth daily.    Marland Kitchen co-enzyme Q-10 30 MG capsule Take 30 mg by mouth 3 (three) times daily.    Marland Kitchen dexlansoprazole (DEXILANT) 60 MG capsule Take 60 mg by mouth daily.    Marland Kitchen diltiazem (CARDIZEM CD) 120 MG 24 hr capsule Take 1 capsule (120 mg total) by mouth daily. 90 capsule 3  . flecainide (TAMBOCOR) 100 MG tablet TAKE 1 TABLET BY MOUTH TWICE DAILY. NEW MEDICATION. 180 tablet 2  . fluticasone (FLONASE) 50 MCG/ACT nasal spray Place 1 spray into both nostrils daily.    . furosemide (LASIX) 20 MG tablet Take 1 tablet (20 mg total) by mouth daily at 2 PM. 90 tablet 3  . lidocaine-prilocaine (EMLA) cream Apply to affected area once 30 g 3  . LORazepam (ATIVAN) 0.5 MG tablet Take 1 tablet (0.5 mg total) by mouth at bedtime as needed  (Nausea or vomiting). 30 tablet 0  . montelukast (SINGULAIR) 10 MG tablet Take 10 mg by mouth daily.    . Multiple Vitamin (MULTIVITAMIN) tablet Take 1 tablet by mouth daily.    Marland Kitchen omega-3 acid ethyl esters (LOVAZA) 1 g capsule Take 1 g by mouth daily.    . ondansetron (ZOFRAN) 8 MG tablet Take 1 tablet (8 mg total) by mouth 2 (two) times daily as needed (Nausea or vomiting). 30 tablet 1  . prochlorperazine (COMPAZINE) 10 MG tablet Take 1 tablet (10 mg  total) by mouth every 6 (six) hours as needed (Nausea or vomiting). 30 tablet 1  . quinapril (ACCUPRIL) 20 MG tablet Take 1 tablet (20 mg total) by mouth every morning. 90 tablet 3   No current facility-administered medications for this visit.     REVIEW OF SYSTEMS:   Constitutional: Denies fevers, chills or abnormal night sweats Eyes: Denies blurriness of vision, double vision or watery eyes Ears, nose, mouth, throat, and face: Denies mucositis or sore throat Respiratory: Denies cough, dyspnea or wheezes Cardiovascular: Denies palpitation, chest discomfort or lower extremity swelling Gastrointestinal:  Denies nausea, heartburn or change in bowel habits Skin: Denies abnormal skin rashes Lymphatics: Denies new lymphadenopathy or easy bruising Neurological:Denies numbness, tingling or new weaknesses Behavioral/Psych: Mood is stable, no new changes (+) emotional about diagnosis/treatment  Breast: Denies any palpable lumps or discharge All other systems were reviewed with the patient and are negative.  PHYSICAL EXAMINATION: ECOG PERFORMANCE STATUS: 1 - Symptomatic but completely ambulatory  Vitals:   08/28/18 1304  BP: (!) 158/84  Pulse: 63  Resp: 16  Temp: 97.9 F (36.6 C)  SpO2: 100%   Filed Weights   08/28/18 1304  Weight: 166 lb (75.3 kg)    GENERAL:alert, no distress and comfortable SKIN: skin color, texture, turgor are normal, no rashes or significant lesions EYES: normal, conjunctiva are pink and non-injected, sclera  clear OROPHARYNX:no exudate, no erythema and lips, buccal mucosa, and tongue normal  NECK: supple, thyroid normal size, non-tender, without nodularity LYMPH:  no palpable lymphadenopathy in the cervical, axillary or inguinal LUNGS: clear to auscultation and percussion with normal breathing effort HEART: regular rate & rhythm and no murmurs and no lower extremity edema ABDOMEN:abdomen soft, non-tender and normal bowel sounds Musculoskeletal:no cyanosis of digits and no clubbing  PSYCH: alert & oriented x 3 with fluent speech NEURO: no focal motor/sensory deficits BREAST: No palpable lumps or nodules  LABORATORY DATA:  I have reviewed the data as listed Lab Results  Component Value Date   WBC 5.9 03/18/2017   HGB 14.9 03/18/2017   HCT 41.4 03/18/2017   MCV 96.3 03/18/2017   PLT 151 03/18/2017   Lab Results  Component Value Date   NA 138 03/18/2017   K 4.3 03/18/2017   CL 105 03/18/2017   CO2 22 03/18/2017    RADIOGRAPHIC STUDIES: I have personally reviewed the radiological reports and agreed with the findings in the report.  ASSESSMENT AND PLAN:  Malignant neoplasm of upper-inner quadrant of left breast in female, estrogen receptor negative ([place]) 08/17/2018:Screening mammogram detected abnormality in the left breast by ultrasound irregular mass 10:00 position 1.3 cm, 10 o'clock position no enlarged lymph nodes, ER 0%, PR 0%, Ki-67 80%, HER-2 +3+ by IHC, grade 3, lymphovascular invasion present, T1c N0 stage Ia clinical stage  Pathology and radiology counseling: Discussed with the patient, the details of pathology including the type of breast cancer,the clinical staging, the significance of ER, PR and HER-2/neu receptors and the implications for treatment. After reviewing the pathology in detail, we proceeded to discuss the different treatment options between surgery, radiation, chemotherapy .  Recommendation: 1.  Neo-adjuvant chemotherapy with Herceptin followed by Herceptin  maintenance for 1 year 2. Breast conserving surgery with sentinel lymph node biopsy 3.  Followed by radiation  Return to clinic to start chemotherapy. [facility] Will request Dr. Donne Hazel to place a port Echocardiogram and chemo class to be scheduled    All questions were answered. The patient knows to call the clinic with any  problems, questions or concerns.    Harriette Ohara, MD 08/28/2018   Oneal Deputy, am acting as scribe for Nicholas Lose, MD.  I have reviewed the above documentation for accuracy and completeness, and I agree with the above.

## 2018-08-28 NOTE — Assessment & Plan Note (Addendum)
08/17/2018:Screening mammogram detected abnormality in the left breast by ultrasound irregular mass 10:00 position 1.3 cm, 10 o'clock position no enlarged lymph nodes, ER 0%, PR 0%, Ki-67 80%, HER-2 +3+ by IHC, grade 3, lymphovascular invasion present, T1c N0 stage Ia clinical stage  Pathology and radiology counseling: Discussed with the patient, the details of pathology including the type of breast cancer,the clinical staging, the significance of ER, PR and HER-2/neu receptors and the implications for treatment. After reviewing the pathology in detail, we proceeded to discuss the different treatment options between surgery, radiation, chemotherapy .  Recommendation: 1.  Neo-adjuvant chemotherapy with Herceptin followed by Herceptin maintenance for 1 year 2. Breast conserving surgery with sentinel lymph node biopsy 3.  Followed by radiation  Return to clinic to start chemotherapy. [facility] Will request Dr. Wakefield to place a port Echocardiogram and chemo class to be scheduled  

## 2018-08-28 NOTE — Telephone Encounter (Signed)
Pt daughter called to make sure that pt is still able to see Dr.Gudena this afternoon. The pt may be running late due to MRI appt at 12pm. Her appt with MD is at 1pm. Will notify MD and told daughter to come as soon as they are done with the MRI. Daughter verbalized understanding.

## 2018-08-28 NOTE — Telephone Encounter (Signed)
Printed calendar and avs. °

## 2018-08-28 NOTE — Progress Notes (Signed)
START OFF PATHWAY REGIMEN - Breast   OFF00020:Paclitaxel + Trastuzumab:   A cycle is every 28 days:     Paclitaxel      Trastuzumab-xxxx      Trastuzumab-xxxx   **Always confirm dose/schedule in your pharmacy ordering system**  Patient Characteristics: Preoperative or Nonsurgical Candidate (Clinical Staging), Neoadjuvant Therapy followed by Surgery, Invasive Disease, Chemotherapy, HER2 Positive, ER Negative/Unknown Therapeutic Status: Preoperative or Nonsurgical Candidate (Clinical Staging) AJCC M Category: cM0 AJCC Grade: G3 Breast Surgical Plan: Neoadjuvant Therapy followed by Surgery ER Status: Negative (-) AJCC 8 Stage Grouping: IA HER2 Status: Positive (+) AJCC T Category: cT1c AJCC N Category: cN0 PR Status: Negative (-) Intent of Therapy: Curative Intent, Discussed with Patient 

## 2018-08-30 ENCOUNTER — Other Ambulatory Visit: Payer: Self-pay | Admitting: Cardiovascular Disease

## 2018-08-30 ENCOUNTER — Ambulatory Visit: Payer: PRIVATE HEALTH INSURANCE | Admitting: Hematology and Oncology

## 2018-08-30 ENCOUNTER — Other Ambulatory Visit: Payer: Self-pay | Admitting: Cardiology

## 2018-08-31 ENCOUNTER — Ambulatory Visit (HOSPITAL_COMMUNITY)
Admission: RE | Admit: 2018-08-31 | Discharge: 2018-08-31 | Disposition: A | Discharge status: Home / Self Care | Payer: PRIVATE HEALTH INSURANCE | Source: Ambulatory Visit | Attending: Hematology and Oncology | Admitting: Hematology and Oncology

## 2018-08-31 ENCOUNTER — Inpatient Hospital Stay: Payer: PRIVATE HEALTH INSURANCE

## 2018-08-31 ENCOUNTER — Encounter (HOSPITAL_COMMUNITY): Payer: Self-pay | Admitting: Vascular Surgery

## 2018-08-31 ENCOUNTER — Other Ambulatory Visit: Payer: PRIVATE HEALTH INSURANCE

## 2018-08-31 ENCOUNTER — Other Ambulatory Visit: Payer: Self-pay

## 2018-08-31 DIAGNOSIS — C50212 Malignant neoplasm of upper-inner quadrant of left female breast: Secondary | ICD-10-CM

## 2018-08-31 DIAGNOSIS — Z95828 Presence of other vascular implants and grafts: Secondary | ICD-10-CM

## 2018-08-31 DIAGNOSIS — Z171 Estrogen receptor negative status [ER-]: Secondary | ICD-10-CM | POA: Insufficient documentation

## 2018-08-31 DIAGNOSIS — Z01818 Encounter for other preprocedural examination: Secondary | ICD-10-CM | POA: Diagnosis not present

## 2018-08-31 DIAGNOSIS — Z5111 Encounter for antineoplastic chemotherapy: Secondary | ICD-10-CM | POA: Diagnosis not present

## 2018-08-31 LAB — CBC WITH DIFFERENTIAL (CANCER CENTER ONLY)
Abs Immature Granulocytes: 0.01 10*3/uL (ref 0.00–0.07)
BASOS PCT: 1 %
Basophils Absolute: 0 10*3/uL (ref 0.0–0.1)
EOS ABS: 0.2 10*3/uL (ref 0.0–0.5)
EOS PCT: 3 %
HCT: 43 % (ref 36.0–46.0)
Hemoglobin: 15.1 g/dL — ABNORMAL HIGH (ref 12.0–15.0)
Immature Granulocytes: 0 %
LYMPHS ABS: 1.6 10*3/uL (ref 0.7–4.0)
Lymphocytes Relative: 29 %
MCH: 33.9 pg (ref 26.0–34.0)
MCHC: 35.1 g/dL (ref 30.0–36.0)
MCV: 96.6 fL (ref 80.0–100.0)
MONO ABS: 0.4 10*3/uL (ref 0.1–1.0)
MONOS PCT: 8 %
NEUTROS PCT: 59 %
Neutro Abs: 3.3 10*3/uL (ref 1.7–7.7)
PLATELETS: 168 10*3/uL (ref 150–400)
RBC: 4.45 MIL/uL (ref 3.87–5.11)
RDW: 11.8 % (ref 11.5–15.5)
WBC Count: 5.5 10*3/uL (ref 4.0–10.5)
nRBC: 0 % (ref 0.0–0.2)

## 2018-08-31 LAB — CMP (CANCER CENTER ONLY)
ALBUMIN: 4.1 g/dL (ref 3.5–5.0)
ALK PHOS: 110 U/L (ref 38–126)
ALT: 112 U/L — AB (ref 0–44)
ANION GAP: 10 (ref 5–15)
AST: 65 U/L — ABNORMAL HIGH (ref 15–41)
BILIRUBIN TOTAL: 0.8 mg/dL (ref 0.3–1.2)
BUN: 14 mg/dL (ref 8–23)
CALCIUM: 9.8 mg/dL (ref 8.9–10.3)
CO2: 28 mmol/L (ref 22–32)
CREATININE: 0.78 mg/dL (ref 0.44–1.00)
Chloride: 104 mmol/L (ref 98–111)
GFR, Est AFR Am: >60 mL/min (ref >60)
GFR, Estimated: >60 mL/min (ref >60)
GLUCOSE: 110 mg/dL — AB (ref 70–99)
Potassium: 4.9 mmol/L (ref 3.5–5.1)
Sodium: 142 mmol/L (ref 135–145)
TOTAL PROTEIN: 7 g/dL (ref 6.5–8.1)

## 2018-08-31 MED ORDER — SODIUM CHLORIDE 0.9% FLUSH
10.0000 mL | Freq: Once | INTRAVENOUS | Status: DC
  Filled 2018-08-31: qty 10

## 2018-08-31 NOTE — Telephone Encounter (Signed)
Ok to refill Diltiazem 120 mg once daily

## 2018-08-31 NOTE — Anesthesia Preprocedure Evaluation (Addendum)
Anesthesia Evaluation  Patient identified by MRN, date of birth, ID band Patient awake    Reviewed: Allergy & Precautions, NPO status , Patient's Chart, lab work & pertinent test results  History of Anesthesia Complications (+) PONV and history of anesthetic complications  Airway Mallampati: II  TM Distance: >3 FB Neck ROM: Full    Dental no notable dental hx. (+) Teeth Intact, Dental Advisory Given   Pulmonary neg pulmonary ROS, former smoker,    Pulmonary exam normal breath sounds clear to auscultation       Cardiovascular hypertension, Normal cardiovascular exam+ dysrhythmias (on flecainide) Supra Ventricular Tachycardia  Rhythm:Regular Rate:Normal  TTE 08/2018 EF 55-60%, no valvular abnormalities   Neuro/Psych negative neurological ROS  negative psych ROS   GI/Hepatic Neg liver ROS, GERD  ,  Endo/Other  negative endocrine ROS  Renal/GU negative Renal ROS  negative genitourinary   Musculoskeletal negative musculoskeletal ROS (+)   Abdominal   Peds  Hematology negative hematology ROS (+)   Anesthesia Other Findings Left breast cancer  Reproductive/Obstetrics                           Anesthesia Physical Anesthesia Plan  ASA: III  Anesthesia Plan: General   Post-op Pain Management:    Induction: Intravenous  PONV Risk Score and Plan: 4 or greater and Ondansetron, Dexamethasone, Midazolam, Scopolamine patch - Pre-op and TIVA  Airway Management Planned: Oral ETT and LMA  Additional Equipment:   Intra-op Plan:   Post-operative Plan: Extubation in OR  Informed Consent: I have reviewed the patients History and Physical, chart, labs and discussed the procedure including the risks, benefits and alternatives for the proposed anesthesia with the patient or authorized representative who has indicated his/her understanding and acceptance.   Dental advisory given  Plan Discussed with:  CRNA  Anesthesia Plan Comments: (PAT note written 08/31/2018 by Myra Gianotti, PA-C. )       Anesthesia Quick Evaluation

## 2018-08-31 NOTE — Progress Notes (Signed)
  Echocardiogram 2D Echocardiogram has been performed.  Jennette Dubin 08/31/2018, 11:14 AM

## 2018-08-31 NOTE — Progress Notes (Signed)
Anesthesia Chart Review: Andrea Clarke    Case:  [id number] Date/Time:  09/03/18 1415   Procedure:  INSERTION PORT-A-CATH WITH [place] (N/A )   Anesthesia type:  General   Pre-op diagnosis:  BREAST CANCER   Location:  [facility] OR ROOM 02 / [facility]   Surgeon:  Rolm Bookbinder, MD      DISCUSSION: Patient is a 72 year old female scheduled for the above procedure. She was recently diagnosed with left breast cancer after 08/17/18 biopsy showed evidence of invasive ductal carcinoma with lymphovascular involvement, Grade III, ER/PR negative, HER2 positive, stage 1a. Chemotherapy has been recommended and later followed with breast conserving surgery and then radiation. A routine echo was done today 08/31/18 in anticipation of her starting chemotherapy. LVEF was normal.   History includes breast cancer, HTN, HLD, GERD, glaucoma, WCT (07/2017, treatment: flecainide), former smoker.  If no acute changes then I anticipate that she can proceed as planned.   PROVIDERS: [name], MD is PCP ([place]). Nicholas Lose MD is HEM-ONC. Last visit 08/18/18.  Curt Bears, Will, MD is EP cardiologist. Last visiit 12/01/17 for follow-up WCT on 07/2017 Holter. No further episode on flecainide. Metoprolol and discontinued and started on diltiazem instead do to patient reports of fatigue.  Jenkins Rouge, MD is primary cardiologist. Last visit 06/01/17. She ultimately had a non-ischemic nuclear stress test and Holter monitor. He referred to EP based on 07/2017 Holter monitor results.    LABS: For day of surgery   EKG: 12/01/17 ([facility]): Marked SB at 45 bpm. LAD. Low voltage QRS.   CV: Echo 08/31/18:  Study Conclusions - Left ventricle: The cavity size was normal. Systolic function was   normal. The estimated ejection fraction was in the range of 55%   to 60%. Wall motion was normal; there were no regional wall   motion abnormalities. Doppler parameters are consistent with   abnormal left  ventricular relaxation (grade 1 diastolic   dysfunction). Doppler parameters are consistent with   indeterminate ventricular filling pressure. - Aortic valve: Transvalvular velocity was within the normal range.   There was no stenosis. There was trivial regurgitation. - Mitral valve: Transvalvular velocity was within the normal range.   There was no evidence for stenosis. There was trivial   regurgitation. - Right ventricle: The cavity size was normal. Wall thickness was   normal. Systolic function was normal. - Atrial septum: No defect or patent foramen ovale was identified. - Tricuspid valve: There was trivial regurgitation. - Pulmonary arteries: Systolic pressure was within the normal   range. PA peak pressure: 26 mm Hg (S). - Global longitudinal strain -22.8% (normal).   ETT 09/13/17:  Blood pressure demonstrated a normal response to exercise.  There was no ST segment deviation noted during stress. No ischemia Rare PVC with exercise Slight QRS widening during stress on flecainide F/U with Dr Curt Bears   Nuclear stress test 07/20/17:  Nuclear stress EF: 72%. The left ventricular ejection fraction is hyperdynamic (>65%).  The study is normal.  This is a low risk study.   24-48 Hour Holter monitor 07/20/17: Minimum HR: 41 BPM at 9:56:17 AM(2) Maximum HR: 145 BPM at 10:09:39 AM Average HR: 61 BPM <1% PVCs <1% APCs Multiple runs of wide complex tachycardia Short runs of narrow complex tachycardia     Stress echo 07/03/17: Study Conclusions - Stress ECG conclusions: The stress ECG was normal. [facility] scoring:   exercise time of 6.5 min; maximum ST deviation of  0 mm; no   angina; resulting score is 7. This score predicts a low risk of   cardiac events. - Staged echo: This study is inadequate for the diagnostic   evaluation of left ventricular regional wall motion. - Impressions: Due to very frequent ventricular bigeminy and   salvos, images are of poor quality and cannot  rule out wall   motion abnormality with stress. Recommend coronary CTA for   further assessment of calcium score and CAD. May also benefit   from 24 hour Holter monitor to assess PVC load. Impressions: - Due to very frequent ventricular bigeminy and salvos, images are   of poor quality and cannot rule out wall motion abnormality with   stress. Recommend coronary CTA for further assessment of calcium   score and CAD. May also benefit from 24 hour Holter monitor to   assess PVC load. (Dr. Johnsie Cancel ordered exercise Myoview and 48 hour Holter monitor).   Past Medical History:  Diagnosis Date  . Breast cancer ([place])    left breast cancer 2019  . 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 Allegra Lai)  . GERD (gastroesophageal reflux disease)   . Glaucoma   . Hyperlipidemia   . Hypertension     Past Surgical History:  Procedure Laterality Date  . ABDOMINAL HYSTERECTOMY    . ANKLE SURGERY    . ROTATOR CUFF REPAIR Right     MEDICATIONS: No current facility-administered medications for this encounter.    Marland Kitchen acetaminophen (TYLENOL) 500 MG tablet  . atorvastatin (LIPITOR) 20 MG tablet  . brimonidine-timolol (COMBIGAN) 0.2-0.5 % ophthalmic solution  . CALCIUM PO  . cetirizine (ZYRTEC) 10 MG tablet  . Cholecalciferol (VITAMIN D3) 125 MCG (5000 UT) CAPS  . Coenzyme Q10 (COQ-10 PO)  . dexlansoprazole (DEXILANT) 60 MG capsule  . diltiazem (CARDIZEM CD) 120 MG 24 hr capsule  . flecainide (TAMBOCOR) 100 MG tablet  . fluticasone (FLONASE) 50 MCG/ACT nasal spray  . KRILL OIL PO  . montelukast (SINGULAIR) 10 MG tablet  . Multiple Vitamin (MULTIVITAMIN) tablet  . Polyethyl Glycol-Propyl Glycol (SYSTANE OP)  . furosemide (LASIX) 20 MG tablet  . lidocaine-prilocaine (EMLA) cream  . LORazepam (ATIVAN) 0.5 MG tablet  . ondansetron (ZOFRAN) 8 MG tablet  . prochlorperazine (COMPAZINE) 10 MG tablet  . quinapril (ACCUPRIL) 20 MG tablet    George Hugh [facility] Short Stay Center/Anesthesiology Phone [phone] 08/31/2018 5:06 PM

## 2018-08-31 NOTE — Progress Notes (Signed)
Medical history and medications reviewed. Pt given pre-op instructions and questions answered. Pt instructed she can drink clear liquids until 3 hours prior to surgery. Pt denies cardiac issues other than atrial tachycardia. Pt states she sees Dr. Curt Bears for this. Echo performed today.

## 2018-09-03 ENCOUNTER — Ambulatory Visit (HOSPITAL_COMMUNITY): Payer: PRIVATE HEALTH INSURANCE

## 2018-09-03 ENCOUNTER — Encounter (HOSPITAL_COMMUNITY)
Admission: RE | Disposition: A | Discharge status: Home / Self Care | Payer: Self-pay | Source: Ambulatory Visit | Attending: General Surgery

## 2018-09-03 ENCOUNTER — Ambulatory Visit (HOSPITAL_COMMUNITY): Payer: PRIVATE HEALTH INSURANCE | Admitting: Vascular Surgery

## 2018-09-03 ENCOUNTER — Encounter (HOSPITAL_COMMUNITY): Payer: Self-pay | Admitting: *Deleted

## 2018-09-03 ENCOUNTER — Ambulatory Visit (HOSPITAL_COMMUNITY)
Admission: RE | Admit: 2018-09-03 | Discharge: 2018-09-03 | Disposition: A | Discharge status: Home / Self Care | Payer: PRIVATE HEALTH INSURANCE | Source: Ambulatory Visit | Attending: General Surgery | Admitting: General Surgery

## 2018-09-03 DIAGNOSIS — Z87891 Personal history of nicotine dependence: Secondary | ICD-10-CM | POA: Insufficient documentation

## 2018-09-03 DIAGNOSIS — I10 Essential (primary) hypertension: Secondary | ICD-10-CM | POA: Insufficient documentation

## 2018-09-03 DIAGNOSIS — Z95828 Presence of other vascular implants and grafts: Secondary | ICD-10-CM

## 2018-09-03 DIAGNOSIS — C50212 Malignant neoplasm of upper-inner quadrant of left female breast: Secondary | ICD-10-CM | POA: Diagnosis present

## 2018-09-03 DIAGNOSIS — E785 Hyperlipidemia, unspecified: Secondary | ICD-10-CM | POA: Insufficient documentation

## 2018-09-03 DIAGNOSIS — Z1501 Genetic susceptibility to malignant neoplasm of breast: Secondary | ICD-10-CM | POA: Insufficient documentation

## 2018-09-03 DIAGNOSIS — Z79899 Other long term (current) drug therapy: Secondary | ICD-10-CM | POA: Diagnosis not present

## 2018-09-03 DIAGNOSIS — H409 Unspecified glaucoma: Secondary | ICD-10-CM | POA: Diagnosis not present

## 2018-09-03 DIAGNOSIS — K219 Gastro-esophageal reflux disease without esophagitis: Secondary | ICD-10-CM | POA: Insufficient documentation

## 2018-09-03 DIAGNOSIS — Z419 Encounter for procedure for purposes other than remedying health state, unspecified: Secondary | ICD-10-CM

## 2018-09-03 HISTORY — DX: Nausea with vomiting, unspecified: R11.2

## 2018-09-03 HISTORY — DX: Supraventricular tachycardia: I47.1

## 2018-09-03 HISTORY — DX: Other specified postprocedural states: Z98.890

## 2018-09-03 HISTORY — DX: Cardiac arrhythmia, unspecified: I49.9

## 2018-09-03 HISTORY — DX: Malignant neoplasm of unspecified site of unspecified female breast: C50.919

## 2018-09-03 HISTORY — PX: PORTACATH PLACEMENT: SHX2246

## 2018-09-03 HISTORY — DX: Other supraventricular tachycardia: I47.19

## 2018-09-03 LAB — CBC
HEMATOCRIT: 43.6 % (ref 36.0–46.0)
HEMOGLOBIN: 14.9 g/dL (ref 12.0–15.0)
MCH: 33.3 pg (ref 26.0–34.0)
MCHC: 34.2 g/dL (ref 30.0–36.0)
MCV: 97.3 fL (ref 80.0–100.0)
Platelets: 166 10*3/uL (ref 150–400)
RBC: 4.48 MIL/uL (ref 3.87–5.11)
RDW: 11.9 % (ref 11.5–15.5)
WBC: 5.7 10*3/uL (ref 4.0–10.5)
nRBC: 0 % (ref 0.0–0.2)

## 2018-09-03 LAB — BASIC METABOLIC PANEL
ANION GAP: 10 (ref 5–15)
BUN: 16 mg/dL (ref 8–23)
CHLORIDE: 103 mmol/L (ref 98–111)
CO2: 23 mmol/L (ref 22–32)
Calcium: 9.3 mg/dL (ref 8.9–10.3)
Creatinine, Ser: 0.73 mg/dL (ref 0.44–1.00)
GFR calc Af Amer: >60 mL/min (ref >60)
GFR calc non Af Amer: >60 mL/min (ref >60)
Glucose, Bld: 129 mg/dL — ABNORMAL HIGH (ref 70–99)
POTASSIUM: 4.2 mmol/L (ref 3.5–5.1)
Sodium: 136 mmol/L (ref 135–145)

## 2018-09-03 SURGERY — INSERTION, TUNNELED CENTRAL VENOUS DEVICE, WITH PORT
Anesthesia: General | Laterality: Right

## 2018-09-03 MED ORDER — LIDOCAINE HCL (CARDIAC) PF 100 MG/5ML IV SOSY
PREFILLED_SYRINGE | INTRAVENOUS | Status: DC | PRN
  Administered 2018-09-03: 100 mg via INTRAVENOUS

## 2018-09-03 MED ORDER — MIDAZOLAM HCL 5 MG/5ML IJ SOLN
INTRAMUSCULAR | Status: DC | PRN
  Administered 2018-09-03 (×2): 1 mg via INTRAVENOUS

## 2018-09-03 MED ORDER — BUPIVACAINE HCL (PF) 0.25 % IJ SOLN
INTRAMUSCULAR | Status: AC
  Filled 2018-09-03: qty 30

## 2018-09-03 MED ORDER — SCOPOLAMINE 1 MG/3DAYS TD PT72
MEDICATED_PATCH | TRANSDERMAL | Status: DC | PRN
  Administered 2018-09-03: 1 via TRANSDERMAL

## 2018-09-03 MED ORDER — LACTATED RINGERS IV SOLN
INTRAVENOUS | Status: DC
  Administered 2018-09-03: 13:00:00 via INTRAVENOUS

## 2018-09-03 MED ORDER — FENTANYL CITRATE (PF) 100 MCG/2ML IJ SOLN: 25.0000 ug | INTRAMUSCULAR | Status: DC | PRN

## 2018-09-03 MED ORDER — BUPIVACAINE HCL 0.25 % IJ SOLN
INTRAMUSCULAR | Status: DC | PRN
  Administered 2018-09-03: 10 mL

## 2018-09-03 MED ORDER — TRAMADOL HCL 50 MG PO TABS: 50.0000 mg | ORAL_TABLET | Freq: Four times a day (QID) | ORAL | 0 refills | Status: DC | PRN

## 2018-09-03 MED ORDER — ACETAMINOPHEN 500 MG PO TABS
1000.0000 mg | ORAL_TABLET | ORAL | Status: AC
  Administered 2018-09-03: 1000 mg via ORAL
  Filled 2018-09-03: qty 2

## 2018-09-03 MED ORDER — FENTANYL CITRATE (PF) 250 MCG/5ML IJ SOLN
INTRAMUSCULAR | Status: AC
  Filled 2018-09-03: qty 5

## 2018-09-03 MED ORDER — DEXAMETHASONE SODIUM PHOSPHATE 4 MG/ML IJ SOLN
INTRAMUSCULAR | Status: DC | PRN
  Administered 2018-09-03: 10 mg via INTRAVENOUS

## 2018-09-03 MED ORDER — SODIUM CHLORIDE 0.9 % IV SOLN
INTRAVENOUS | Status: AC
  Filled 2018-09-03: qty 1.2

## 2018-09-03 MED ORDER — SODIUM CHLORIDE 0.9 % IV SOLN
INTRAVENOUS | Status: DC | PRN
  Administered 2018-09-03: 500 mL

## 2018-09-03 MED ORDER — KETOROLAC TROMETHAMINE 30 MG/ML IJ SOLN
INTRAMUSCULAR | Status: AC
  Filled 2018-09-03: qty 1

## 2018-09-03 MED ORDER — GABAPENTIN 100 MG PO CAPS
100.0000 mg | ORAL_CAPSULE | ORAL | Status: AC
  Administered 2018-09-03: 100 mg via ORAL
  Filled 2018-09-03: qty 1

## 2018-09-03 MED ORDER — HEPARIN SOD (PORK) LOCK FLUSH 100 UNIT/ML IV SOLN
INTRAVENOUS | Status: DC | PRN
  Administered 2018-09-03: 500 [IU] via INTRAVENOUS

## 2018-09-03 MED ORDER — SUCCINYLCHOLINE CHLORIDE 200 MG/10ML IV SOSY
PREFILLED_SYRINGE | INTRAVENOUS | Status: DC | PRN
  Administered 2018-09-03: 180 mg via INTRAVENOUS

## 2018-09-03 MED ORDER — HEPARIN SOD (PORK) LOCK FLUSH 100 UNIT/ML IV SOLN
INTRAVENOUS | Status: AC
  Filled 2018-09-03: qty 5

## 2018-09-03 MED ORDER — ONDANSETRON HCL 4 MG/2ML IJ SOLN
INTRAMUSCULAR | Status: DC | PRN
  Administered 2018-09-03: 4 mg via INTRAVENOUS

## 2018-09-03 MED ORDER — EPHEDRINE SULFATE-NACL 50-0.9 MG/10ML-% IV SOSY
PREFILLED_SYRINGE | INTRAVENOUS | Status: DC | PRN
  Administered 2018-09-03 (×2): 5 mg via INTRAVENOUS

## 2018-09-03 MED ORDER — PROPOFOL 10 MG/ML IV BOLUS
INTRAVENOUS | Status: AC
  Filled 2018-09-03: qty 20

## 2018-09-03 MED ORDER — ROCURONIUM BROMIDE 50 MG/5ML IV SOSY
PREFILLED_SYRINGE | INTRAVENOUS | Status: AC
  Filled 2018-09-03: qty 10

## 2018-09-03 MED ORDER — 0.9 % SODIUM CHLORIDE (POUR BTL) OPTIME
TOPICAL | Status: DC | PRN
  Administered 2018-09-03: 1000 mL

## 2018-09-03 MED ORDER — MIDAZOLAM HCL 2 MG/2ML IJ SOLN
INTRAMUSCULAR | Status: AC
  Filled 2018-09-03: qty 2

## 2018-09-03 MED ORDER — PROPOFOL 10 MG/ML IV BOLUS
INTRAVENOUS | Status: DC | PRN
  Administered 2018-09-03: 50 mg via INTRAVENOUS
  Administered 2018-09-03: 150 mg via INTRAVENOUS

## 2018-09-03 MED ORDER — DEXAMETHASONE SODIUM PHOSPHATE 10 MG/ML IJ SOLN
INTRAMUSCULAR | Status: AC
  Filled 2018-09-03: qty 2

## 2018-09-03 MED ORDER — LIDOCAINE 2% (20 MG/ML) 5 ML SYRINGE
INTRAMUSCULAR | Status: AC
  Filled 2018-09-03: qty 15

## 2018-09-03 MED ORDER — PROPOFOL 500 MG/50ML IV EMUL
INTRAVENOUS | Status: DC | PRN
  Administered 2018-09-03: 150 ug/kg/min via INTRAVENOUS

## 2018-09-03 MED ORDER — CIPROFLOXACIN IN D5W 400 MG/200ML IV SOLN
400.0000 mg | INTRAVENOUS | Status: AC
  Administered 2018-09-03: 400 mg via INTRAVENOUS
  Filled 2018-09-03: qty 200

## 2018-09-03 MED ORDER — ONDANSETRON HCL 4 MG/2ML IJ SOLN
INTRAMUSCULAR | Status: AC
  Filled 2018-09-03: qty 6

## 2018-09-03 MED ORDER — FENTANYL CITRATE (PF) 100 MCG/2ML IJ SOLN
INTRAMUSCULAR | Status: DC | PRN
  Administered 2018-09-03: 100 ug via INTRAVENOUS

## 2018-09-03 SURGICAL SUPPLY — 45 items
ADH SKN CLS APL DERMABOND .7 (GAUZE/BANDAGES/DRESSINGS) ×1
BAG DECANTER FOR FLEXI CONT (MISCELLANEOUS) ×2 IMPLANT
CHLORAPREP W/TINT 10.5 ML (MISCELLANEOUS) ×2 IMPLANT
CLSR STERI-STRIP ANTIMIC 1/2X4 (GAUZE/BANDAGES/DRESSINGS) ×1 IMPLANT
COVER SURGICAL LIGHT HANDLE (MISCELLANEOUS) ×2 IMPLANT
COVER TRANSDUCER ULTRASND GEL (DRAPE) ×2 IMPLANT
COVER WAND RF STERILE (DRAPES) ×2 IMPLANT
CRADLE DONUT ADULT HEAD (MISCELLANEOUS) ×2 IMPLANT
DECANTER SPIKE VIAL GLASS SM (MISCELLANEOUS) ×2 IMPLANT
DERMABOND ADVANCED (GAUZE/BANDAGES/DRESSINGS) ×1
DERMABOND ADVANCED .7 DNX12 (GAUZE/BANDAGES/DRESSINGS) ×1 IMPLANT
DRAPE C-ARM 42X72 X-RAY (DRAPES) ×2 IMPLANT
DRAPE CHEST BREAST 15X10 FENES (DRAPES) ×2 IMPLANT
DRSG TEGADERM 4X4.75 (GAUZE/BANDAGES/DRESSINGS) ×2 IMPLANT
ELECT CAUTERY BLADE 6.4 (BLADE) ×2 IMPLANT
ELECT REM PT RETURN 9FT ADLT (ELECTROSURGICAL) ×2
ELECTRODE REM PT RTRN 9FT ADLT (ELECTROSURGICAL) ×1 IMPLANT
GAUZE 4X4 16PLY RFD (DISPOSABLE) ×2 IMPLANT
GAUZE SPONGE 4X4 12PLY STRL (GAUZE/BANDAGES/DRESSINGS) ×2 IMPLANT
GEL ULTRASOUND 20GR AQUASONIC (MISCELLANEOUS) ×2 IMPLANT
GLOVE BIO SURGEON STRL SZ7 (GLOVE) ×2 IMPLANT
GLOVE BIOGEL PI IND STRL 7.5 (GLOVE) ×1 IMPLANT
GLOVE BIOGEL PI INDICATOR 7.5 (GLOVE) ×1
GOWN STRL REUS W/ TWL LRG LVL3 (GOWN DISPOSABLE) ×2 IMPLANT
GOWN STRL REUS W/TWL LRG LVL3 (GOWN DISPOSABLE) ×4
INTRODUCER COOK 11FR (CATHETERS) IMPLANT
KIT BASIN OR (CUSTOM PROCEDURE TRAY) ×2 IMPLANT
KIT PORT POWER 8FR ISP CVUE (Port) ×2 IMPLANT
KIT TURNOVER KIT B (KITS) ×2 IMPLANT
NS IRRIG 1000ML POUR BTL (IV SOLUTION) ×2 IMPLANT
PAD ARMBOARD 7.5X6 YLW CONV (MISCELLANEOUS) ×4 IMPLANT
PENCIL BUTTON HOLSTER BLD 10FT (ELECTRODE) ×2 IMPLANT
SET INTRODUCER 12FR PACEMAKER (INTRODUCER) IMPLANT
SET SHEATH INTRODUCER 10FR (MISCELLANEOUS) IMPLANT
SHEATH COOK PEEL AWAY SET 9F (SHEATH) IMPLANT
SUT MNCRL AB 4-0 PS2 18 (SUTURE) ×2 IMPLANT
SUT PROLENE 2 0 SH DA (SUTURE) ×2 IMPLANT
SUT SILK 2 0 (SUTURE)
SUT SILK 2-0 18XBRD TIE 12 (SUTURE) IMPLANT
SUT VIC AB 3-0 SH 27 (SUTURE) ×2
SUT VIC AB 3-0 SH 27XBRD (SUTURE) ×1 IMPLANT
SYR 5ML LUER SLIP (SYRINGE) ×2 IMPLANT
TOWEL OR 17X24 6PK STRL BLUE (TOWEL DISPOSABLE) ×2 IMPLANT
TOWEL OR 17X26 10 PK STRL BLUE (TOWEL DISPOSABLE) ×2 IMPLANT
TRAY LAPAROSCOPIC MC (CUSTOM PROCEDURE TRAY) ×2 IMPLANT

## 2018-09-03 NOTE — Transfer of Care (Signed)
Immediate Anesthesia Transfer of Care Note  Patient: Andrea Clarke  Procedure(s) Performed: INSERTION PORT-A-CATH WITH [place] (Right )  Patient Location: PACU  Anesthesia Type:General  Level of Consciousness: awake, alert  and oriented  Airway & Oxygen Therapy: Patient Spontanous Breathing and Patient connected to nasal cannula oxygen  Post-op Assessment: Report given to RN and Post -op Vital signs reviewed and stable  Post vital signs: Reviewed and stable  Last Vitals:  Vitals Value Taken Time  BP 130/79 09/03/2018  2:46 PM  Temp 36.5 C 09/03/2018  2:46 PM  Pulse 70 09/03/2018  2:48 PM  Resp 15 09/03/2018  2:48 PM  SpO2 96 % 09/03/2018  2:48 PM  Vitals shown include unvalidated device data.  Last Pain:  Vitals:   09/03/18 1446  TempSrc:   PainSc: 0-No pain      Patients Stated Pain Goal: 5 (38/45/36 4680)  Complications: No apparent anesthesia complications

## 2018-09-03 NOTE — Discharge Instructions (Signed)
    PORT-A-CATH: POST OP INSTRUCTIONS  Always review your discharge instruction sheet given to you by the facility where your surgery was performed.   1. A prescription for pain medication may be given to you upon discharge. Take your pain medication as prescribed, if needed. If narcotic pain medicine is not needed, then you make take acetaminophen (Tylenol) or ibuprofen (Advil) as needed.  2. Take your usually prescribed medications unless otherwise directed. 3. If you need a refill on your pain medication, please contact our office. All narcotic pain medicine now requires a paper prescription.  Phoned in and fax refills are no longer allowed by law.  Prescriptions will not be filled after 5 pm or on weekends.  4. You should follow a light diet for the remainder of the day after your procedure. 5. Most patients will experience some mild swelling and/or bruising in the area of the incision. It may take several days to resolve. 6. It is common to experience some constipation if taking pain medication after surgery. Increasing fluid intake and taking a stool softener (such as Colace) will usually help or prevent this problem from occurring. A mild laxative (Milk of Magnesia or Miralax) should be taken according to package directions if there are no bowel movements after 48 hours.  7. Unless discharge instructions indicate otherwise, you may remove your bandages 48 hours after surgery, and you may shower at that time. You may have steri-strips (small white skin tapes) in place directly over the incision.  These strips should be left on the skin for 7-10 days.  If your surgeon used Dermabond (skin glue) on the incision, you may shower in 24 hours.  The glue will flake off over the next 2-3 weeks.  8. If your port is left accessed at the end of surgery (needle left in port), the dressing cannot get wet and should only by changed by a healthcare professional. When the port is no longer accessed (when the  needle has been removed), follow step 7.   9. ACTIVITIES:  Limit activity involving your arms for the next 72 hours. Do no strenuous exercise or activity for 1 week. You may drive when you are no longer taking prescription pain medication, you can comfortably wear a seatbelt, and you can maneuver your car. 10.You may need to see your doctor in the office for a follow-up appointment.  Please       check with your doctor.  11.When you receive a new Port-a-Cath, you will get a product guide and        ID card.  Please keep them in case you need them.  WHEN TO CALL YOUR DOCTOR ([phone]): 1. Fever over 101.0 2. Chills 3. Continued bleeding from incision 4. Increased redness and tenderness at the site 5. Shortness of breath, difficulty breathing   The clinic staff is available to answer your questions during regular business hours. Please don't hesitate to call and ask to speak to one of the nurses or medical assistants for clinical concerns. If you have a medical emergency, go to the nearest emergency room or call 911.  A surgeon from [facility] is always on call at the hospital.     For further information, please visit [email or web address]      

## 2018-09-03 NOTE — Op Note (Signed)
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 We discussed all options and elected to proceed with systemic therapy. She is due to begin chemotherapy. We discussed port placement.  Procedure: After informed consent was obtained the patient was taken to the operating room. She was given antibiotics. Sequential compression devices were on her legs. She was then placed under general anesthesia. Then she was prepped and draped in the standard sterile surgical fashion. Surgical timeout was then performed.  Ithenused the ultrasound to identify the right internal jugular vein. I then accessed the vein using the ultrasound.This aspirated blood. I then placed the wire. This was confirmed by fluoroscopy and ultrasound to be in the correct position.I tunneled the line between the 2 sites.I then dilated the tract and placed the dilator assembly with the sheath. This was done under fluoroscopy. I then removed the sheath and dilator. The wire was also removed. The line was then pulled back to be in the venacava. I hooked this up to the port. I sutured this into place with 2-0 Prolene in 2 places. This aspirated blood and flushed easily.This was confirmed with a final fluoroscopy. I then closed this with 2-0 Vicryl and 4-0 Monocryl.This withdrew blood and I placed heparin in it.Dermabond was placed on both the incisions.I then accessed the port for use tomorrow. She tolerated this well and was transferred to the recovery room in stable condition

## 2018-09-03 NOTE — Telephone Encounter (Signed)
Outpatient Medication Detail    Disp Refills Start    diltiazem (CARDIZEM CD) 120 MG 24 hr capsule 90 capsule 3 12/01/2017    Sig - Route: Take 1 capsule (120 mg total) by mouth daily. - Oral   Sent to pharmacy as: diltiazem (CARDIZEM CD) 120 MG 24 hr capsule   Notes to Pharmacy: Stopping Metoprolol   E-Prescribing Status: Receipt confirmed by pharmacy (12/01/2017 9:55 AM EST)   Pharmacy   [id number] - [place], [place] - [address] AT [place]

## 2018-09-03 NOTE — H&P (Signed)
72 yo female referred by Dr Karle Starch for new left breast cancer. she has no family history. she has no real personal history of breast disease. she had no mass or dc. she was noted on screening mm to have b density breasts. there was a possible left breast mass. dx views showed a 1.4 cm mass in the inner left breast. US shows a 1.3x0.8x1.1 cm mass at 10 oclock 7 cm from nipple. the left axilla was negative. 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  she is here with her daughter to discuss options. she works full time and lives in [place]   Past Surgical History (Tanisha A. Owens Shark, [place]; 08/21/2018 3:00 PM) Appendectomy  Breast Biopsy  Left. Cataract Surgery  Bilateral. Foot Surgery  Right. Hysterectomy (not due to cancer) - Complete  Shoulder Surgery  Right. Tonsillectomy   Diagnostic Studies History (Tanisha A. Owens Shark, [place]; 08/21/2018 3:00 PM) Colonoscopy  5-10 years ago Mammogram  within last year Pap Smear  1-5 years ago  Allergies (Tanisha A. Owens Shark, [place]; 08/21/2018 3:02 PM) No Known Drug Allergies [08/21/2018]: Allergies Reconciled   Medication History (Tanisha A. Owens Shark, [place]; 08/21/2018 3:04 PM) Lipitor (20MG  Tablet, Oral) Active. Combigan (0.2-0.5% Solution, Ophthalmic) Active. Calcium (200MG  Tablet, Oral) Active. ZyrTEC Allergy (10MG  Tablet, Oral) Active. Dexilant (60MG  Capsule DR, Oral) Active. Cardizem (120MG  Tablet, Oral) Active. Tambocor (100MG  Tablet, Oral) Active. Flonase (50MCG/DOSE Inhaler, Nasal) Active. Lasix (20MG  Tablet, Oral) Active. Singulair (10MG  Tablet, Oral) Active. Multi-Vitamin (Oral) Active. Medications Reconciled  Social History (Tanisha A. Owens Shark, [place]; 08/21/2018 3:00 PM) Alcohol use  Moderate alcohol use. Caffeine use  Carbonated beverages, Coffee, Tea. No drug use  Tobacco use  Former smoker.  Family History (Tanisha A. Owens Shark, [place]; 08/21/2018 3:00 PM) Arthritis  Family Members In  General. Cerebrovascular Accident  Family Members In General. Cervical Cancer  Sister. Depression  Father. Diabetes Mellitus  Sister. Heart Disease  Family Members In General, Mother. Hypertension  Father, Mother, Sister. Respiratory Condition  Family Members In General.  Pregnancy / Birth History (Tanisha A. Owens Shark, [place]; 08/21/2018 3:00 PM) Age at menarche  46 years. Age of menopause  <47 Contraceptive History  Oral contraceptives. Gravida  1 Maternal age  63-20 Para  1  Other Problems (Tanisha A. Owens Shark, [place]; 08/21/2018 3:00 PM) Breast Cancer  Gastroesophageal Reflux Disease  General anesthesia - complications  High blood pressure  Hypercholesterolemia  Lump In Breast  Migraine Headache  Oophorectomy    Review of Systems (Tanisha A. Brown RMA; 08/21/2018 3:00 PM) General Not Present- Appetite Loss, Chills, Fatigue, Fever, Night Sweats, Weight Gain and Weight Loss. Skin Not Present- Change in Wart/Mole, Dryness, Hives, Jaundice, New Lesions, Non-Healing Wounds, Rash and Ulcer. HEENT Present- Seasonal Allergies. Not Present- Earache, Hearing Loss, Hoarseness, Nose Bleed, Oral Ulcers, Ringing in the Ears, Sinus Pain, Sore Throat, Visual Disturbances, Wears glasses/contact lenses and Yellow Eyes. Breast Present- Breast Mass. Not Present- Breast Pain, Nipple Discharge and Skin Changes. Cardiovascular Not Present- Chest Pain, Difficulty Breathing Lying Down, Leg Cramps, Palpitations, Rapid Heart Rate, Shortness of Breath and Swelling of Extremities. Gastrointestinal Not Present- Abdominal Pain, Bloating, Bloody Stool, Change in Bowel Habits, Chronic diarrhea, Constipation, Difficulty Swallowing, Excessive gas, Gets full quickly at meals, Hemorrhoids, Indigestion, Nausea, Rectal Pain and Vomiting. Female Genitourinary Not Present- Frequency, Nocturia, Painful Urination, Pelvic Pain and Urgency. Musculoskeletal Not Present- Back Pain, Joint Pain, Joint Stiffness, Muscle  Pain, Muscle Weakness and Swelling of Extremities. Neurological Not Present- Decreased Memory, Fainting,  Headaches, Numbness, Seizures, Tingling, Tremor, Trouble walking and Weakness. Psychiatric Not Present- Anxiety, Bipolar, Change in Sleep Pattern, Depression, Fearful and Frequent crying. Endocrine Not Present- Cold Intolerance, Excessive Hunger, Hair Changes, Heat Intolerance, Hot flashes and New Diabetes. Hematology Not Present- Blood Thinners, Easy Bruising, Excessive bleeding, Gland problems, HIV and Persistent Infections.  Vitals (Tanisha A. Brown RMA; 08/21/2018 3:01 PM) 08/21/2018 3:01 PM Weight: 165.2 lb Height: 64in Body Surface Area: 1.8 m Body Mass Index: 28.36 kg/m  Temp.: 98.76F  Pulse: 78 (Regular)  Physical Exam Rolm Bookbinder MD; 08/21/2018 9:34 PM) General Mental Status-Alert. Head and Neck Trachea-midline. Thyroid Gland Characteristics - normal size and consistency. Eye Sclera/Conjunctiva - Bilateral-No scleral icterus. Chest and Lung Exam Chest and lung exam reveals -quiet, even and easy respiratory effort with no use of accessory muscles and on auscultation, normal breath sounds, no adventitious sounds and normal vocal resonance. Breast Nipples-No Discharge. Breast Lump-No Palpable Breast Mass. Cardiovascular Cardiovascular examination reveals -normal heart sounds, regular rate and rhythm with no murmurs. Abdomen Note: soft no hm Neurologic Neurologic evaluation reveals -alert and oriented x 3 with no impairment of recent or remote memory. Lymphatic Head & Neck General Head & Neck Lymphatics: Bilateral - Description - Normal. Axillary General Axillary Region: Bilateral - Description - Normal. Note: no Glendo adenopathy   Assessment & Plan Rolm Bookbinder MD; 08/21/2018 9:39 PM) BREAST CANCER OF UPPER-INNER QUADRANT OF LEFT FEMALE BREAST (C50.212) Port placement

## 2018-09-03 NOTE — Anesthesia Procedure Notes (Signed)
Procedure Name: Intubation Date/Time: 09/03/2018 1:55 PM Performed by: Candis Shine, CRNA Pre-anesthesia Checklist: Patient identified, Emergency Drugs available, Suction available and Patient being monitored Patient Re-evaluated:Patient Re-evaluated prior to induction Oxygen Delivery Method: Circle System Utilized Preoxygenation: Pre-oxygenation with 100% oxygen Induction Type: IV induction Laryngoscope Size: Glidescope and 4 Grade View: Grade I Tube type: Oral Tube size: 7.0 mm Number of attempts: 1 Airway Equipment and Method: Stylet Placement Confirmation: ETT inserted through vocal cords under direct vision,  positive ETCO2 and breath sounds checked- equal and bilateral Secured at: 22 cm Tube secured with: Tape Dental Injury: Teeth and Oropharynx as per pre-operative assessment

## 2018-09-04 ENCOUNTER — Encounter: Payer: Self-pay | Admitting: Hematology and Oncology

## 2018-09-04 ENCOUNTER — Inpatient Hospital Stay: Payer: PRIVATE HEALTH INSURANCE

## 2018-09-04 ENCOUNTER — Encounter: Payer: Self-pay | Admitting: *Deleted

## 2018-09-04 ENCOUNTER — Encounter (HOSPITAL_COMMUNITY): Payer: Self-pay | Admitting: General Surgery

## 2018-09-04 VITALS — BP 137/78 | HR 68 | Temp 98.0°F | Resp 20

## 2018-09-04 DIAGNOSIS — C50212 Malignant neoplasm of upper-inner quadrant of left female breast: Secondary | ICD-10-CM

## 2018-09-04 DIAGNOSIS — Z5111 Encounter for antineoplastic chemotherapy: Secondary | ICD-10-CM | POA: Diagnosis not present

## 2018-09-04 DIAGNOSIS — Z171 Estrogen receptor negative status [ER-]: Principal | ICD-10-CM

## 2018-09-04 MED ORDER — DIPHENHYDRAMINE HCL 50 MG/ML IJ SOLN
INTRAMUSCULAR | Status: AC
  Filled 2018-09-04: qty 1

## 2018-09-04 MED ORDER — ACETAMINOPHEN 325 MG PO TABS
ORAL_TABLET | ORAL | Status: AC
  Filled 2018-09-04: qty 2

## 2018-09-04 MED ORDER — ACETAMINOPHEN 325 MG PO TABS
650.0000 mg | ORAL_TABLET | Freq: Once | ORAL | Status: AC
  Administered 2018-09-04: 650 mg via ORAL

## 2018-09-04 MED ORDER — TRASTUZUMAB CHEMO 150 MG IV SOLR
300.0000 mg | Freq: Once | INTRAVENOUS | Status: AC
  Administered 2018-09-04: 300 mg via INTRAVENOUS
  Filled 2018-09-04: qty 14.29

## 2018-09-04 MED ORDER — SODIUM CHLORIDE 0.9 % IV SOLN
Freq: Once | INTRAVENOUS | Status: AC
  Administered 2018-09-04: 09:00:00 via INTRAVENOUS
  Filled 2018-09-04: qty 250

## 2018-09-04 MED ORDER — SODIUM CHLORIDE 0.9 % IV SOLN
80.0000 mg/m2 | Freq: Once | INTRAVENOUS | Status: AC
  Administered 2018-09-04: 144 mg via INTRAVENOUS
  Filled 2018-09-04: qty 24

## 2018-09-04 MED ORDER — FAMOTIDINE IN NACL 20-0.9 MG/50ML-% IV SOLN
INTRAVENOUS | Status: AC
  Filled 2018-09-04: qty 50

## 2018-09-04 MED ORDER — HEPARIN SOD (PORK) LOCK FLUSH 100 UNIT/ML IV SOLN
500.0000 [IU] | Freq: Once | INTRAVENOUS | Status: AC | PRN
  Administered 2018-09-04: 500 [IU]
  Filled 2018-09-04: qty 5

## 2018-09-04 MED ORDER — DIPHENHYDRAMINE HCL 50 MG/ML IJ SOLN
50.0000 mg | Freq: Once | INTRAMUSCULAR | Status: AC
  Administered 2018-09-04: 50 mg via INTRAVENOUS

## 2018-09-04 MED ORDER — SODIUM CHLORIDE 0.9% FLUSH
10.0000 mL | INTRAVENOUS | Status: DC | PRN
  Administered 2018-09-04: 10 mL
  Filled 2018-09-04: qty 10

## 2018-09-04 MED ORDER — SODIUM CHLORIDE 0.9 % IV SOLN
20.0000 mg | Freq: Once | INTRAVENOUS | Status: AC
  Administered 2018-09-04: 20 mg via INTRAVENOUS
  Filled 2018-09-04: qty 2

## 2018-09-04 MED ORDER — FAMOTIDINE IN NACL 20-0.9 MG/50ML-% IV SOLN
20.0000 mg | Freq: Once | INTRAVENOUS | Status: AC
  Administered 2018-09-04: 20 mg via INTRAVENOUS

## 2018-09-04 NOTE — Progress Notes (Signed)
Went to infusion to introduce myself as Arboriculturist and to ask about insurance ded/OOP. Patient states she has met everything for this year she believes. Advised her there may have been copay assistance she could apply for for Herceptin but patient has Medicare Part A therefore she would not qualify.  Discussed the one-time $1000 Radio broadcast assistant to assist with personal household bills and gas cards while going through treatment. She states she is over-income for the grant based on verbal given.  Gave her my card for any additional financial questions or concerns.

## 2018-09-04 NOTE — Patient Instructions (Signed)
[facility] Discharge Instructions for Patients Receiving Chemotherapy  Today you received the following chemotherapy agents: Trastuzumab (Herceptin) and Paclitaxel (Taxol)  To help prevent nausea and vomiting after your treatment, we encourage you to take your nausea medication as directed.    If you develop nausea and vomiting that is not controlled by your nausea medication, call the clinic.   BELOW ARE SYMPTOMS THAT SHOULD BE REPORTED IMMEDIATELY:  *FEVER GREATER THAN 100.5 F  *CHILLS WITH OR WITHOUT FEVER  NAUSEA AND VOMITING THAT IS NOT CONTROLLED WITH YOUR NAUSEA MEDICATION  *UNUSUAL SHORTNESS OF BREATH  *UNUSUAL BRUISING OR BLEEDING  TENDERNESS IN MOUTH AND THROAT WITH OR WITHOUT PRESENCE OF ULCERS  *URINARY PROBLEMS  *BOWEL PROBLEMS  UNUSUAL RASH Items with * indicate a potential emergency and should be followed up as soon as possible.  Feel free to call the clinic should you have any questions or concerns. The clinic phone number is [phone].  Please show the [place] at check-in to the Emergency Department and triage nurse.  Trastuzumab injection for infusion What is this medicine? TRASTUZUMAB (tras TOO zoo mab) is a monoclonal antibody. It is used to treat breast cancer and stomach cancer. This medicine may be used for other purposes; ask your health care provider or pharmacist if you have questions. COMMON BRAND NAME(S): Herceptin What should I tell my health care provider before I take this medicine? They need to know if you have any of these conditions: -heart disease -heart failure -lung or breathing disease, like asthma -an unusual or allergic reaction to trastuzumab, benzyl alcohol, or other medications, foods, dyes, or preservatives -pregnant or trying to get pregnant -breast-feeding How should I use this medicine? This drug is given as an infusion into a vein. It is administered in a hospital or clinic by a specially  trained health care professional. Talk to your pediatrician regarding the use of this medicine in children. This medicine is not approved for use in children. Overdosage: If you think you have taken too much of this medicine contact a poison control center or emergency room at once. NOTE: This medicine is only for you. Do not share this medicine with others. What if I miss a dose? It is important not to miss a dose. Call your doctor or health care professional if you are unable to keep an appointment. What may interact with this medicine? This medicine may interact with the following medications: -certain types of chemotherapy, such as daunorubicin, doxorubicin, epirubicin, and idarubicin This list may not describe all possible interactions. Give your health care provider a list of all the medicines, herbs, non-prescription drugs, or dietary supplements you use. Also tell them if you smoke, drink alcohol, or use illegal drugs. Some items may interact with your medicine. What should I watch for while using this medicine? Visit your doctor for checks on your progress. Report any side effects. Continue your course of treatment even though you feel ill unless your doctor tells you to stop. Call your doctor or health care professional for advice if you get a fever, chills or sore throat, or other symptoms of a cold or flu. Do not treat yourself. Try to avoid being around people who are sick. You may experience fever, chills and shaking during your first infusion. These effects are usually mild and can be treated with other medicines. Report any side effects during the infusion to your health care professional. Fever and chills usually do not happen with later infusions.  Do not become pregnant while taking this medicine or for 7 months after stopping it. Women should inform their doctor if they wish to become pregnant or think they might be pregnant. Women of child-bearing potential will need to have a  negative pregnancy test before starting this medicine. There is a potential for serious side effects to an unborn child. Talk to your health care professional or pharmacist for more information. Do not breast-feed an infant while taking this medicine or for 7 months after stopping it. Women must use effective birth control with this medicine. What side effects may I notice from receiving this medicine? Side effects that you should report to your doctor or health care professional as soon as possible: -allergic reactions like skin rash, itching or hives, swelling of the face, lips, or tongue -chest pain or palpitations -cough -dizziness -feeling faint or lightheaded, falls -fever -general ill feeling or flu-like symptoms -signs of worsening heart failure like breathing problems; swelling in your legs and feet -unusually weak or tired Side effects that usually do not require medical attention (report to your doctor or health care professional if they continue or are bothersome): -bone pain -changes in taste -diarrhea -joint pain -nausea/vomiting -weight loss This list may not describe all possible side effects. Call your doctor for medical advice about side effects. You may report side effects to FDA at 1-800-FDA-1088. Where should I keep my medicine? This drug is given in a hospital or clinic and will not be stored at home. NOTE: This sheet is a summary. It may not cover all possible information. If you have questions about this medicine, talk to your doctor, pharmacist, or health care provider.  2018 Elsevier/Gold Standard (2016-09-27 14:37:52)  Paclitaxel injection What is this medicine? PACLITAXEL (PAK li TAX el) is a chemotherapy drug. It targets fast dividing cells, like cancer cells, and causes these cells to die. This medicine is used to treat ovarian cancer, breast cancer, and other cancers. This medicine may be used for other purposes; ask your health care provider or pharmacist  if you have questions. COMMON BRAND NAME(S): Onxol, Taxol What should I tell my health care provider before I take this medicine? They need to know if you have any of these conditions: -blood disorders -irregular heartbeat -infection (especially a virus infection such as chickenpox, cold sores, or herpes) -liver disease -previous or ongoing radiation therapy -an unusual or allergic reaction to paclitaxel, alcohol, polyoxyethylated castor oil, other chemotherapy agents, other medicines, foods, dyes, or preservatives -pregnant or trying to get pregnant -breast-feeding How should I use this medicine? This drug is given as an infusion into a vein. It is administered in a hospital or clinic by a specially trained health care professional. Talk to your pediatrician regarding the use of this medicine in children. Special care may be needed. Overdosage: If you think you have taken too much of this medicine contact a poison control center or emergency room at once. NOTE: This medicine is only for you. Do not share this medicine with others. What if I miss a dose? It is important not to miss your dose. Call your doctor or health care professional if you are unable to keep an appointment. What may interact with this medicine? Do not take this medicine with any of the following medications: -disulfiram -metronidazole This medicine may also interact with the following medications: -cyclosporine -diazepam -ketoconazole -medicines to increase blood counts like filgrastim, pegfilgrastim, sargramostim -other chemotherapy drugs like cisplatin, doxorubicin, epirubicin, etoposide, teniposide, vincristine -quinidine -testosterone -  vaccines -verapamil Talk to your doctor or health care professional before taking any of these medicines: -acetaminophen -aspirin -ibuprofen -ketoprofen -naproxen This list may not describe all possible interactions. Give your health care provider a list of all the  medicines, herbs, non-prescription drugs, or dietary supplements you use. Also tell them if you smoke, drink alcohol, or use illegal drugs. Some items may interact with your medicine. What should I watch for while using this medicine? Your condition will be monitored carefully while you are receiving this medicine. You will need important blood work done while you are taking this medicine. This medicine can cause serious allergic reactions. To reduce your risk you will need to take other medicine(s) before treatment with this medicine. If you experience allergic reactions like skin rash, itching or hives, swelling of the face, lips, or tongue, tell your doctor or health care professional right away. In some cases, you may be given additional medicines to help with side effects. Follow all directions for their use. This drug may make you feel generally unwell. This is not uncommon, as chemotherapy can affect healthy cells as well as cancer cells. Report any side effects. Continue your course of treatment even though you feel ill unless your doctor tells you to stop. Call your doctor or health care professional for advice if you get a fever, chills or sore throat, or other symptoms of a cold or flu. Do not treat yourself. This drug decreases your body's ability to fight infections. Try to avoid being around people who are sick. This medicine may increase your risk to bruise or bleed. Call your doctor or health care professional if you notice any unusual bleeding. Be careful brushing and flossing your teeth or using a toothpick because you may get an infection or bleed more easily. If you have any dental work done, tell your dentist you are receiving this medicine. Avoid taking products that contain aspirin, acetaminophen, ibuprofen, naproxen, or ketoprofen unless instructed by your doctor. These medicines may hide a fever. Do not become pregnant while taking this medicine. Women should inform their doctor if  they wish to become pregnant or think they might be pregnant. There is a potential for serious side effects to an unborn child. Talk to your health care professional or pharmacist for more information. Do not breast-feed an infant while taking this medicine. Men are advised not to father a child while receiving this medicine. This product may contain alcohol. Ask your pharmacist or healthcare provider if this medicine contains alcohol. Be sure to tell all healthcare providers you are taking this medicine. Certain medicines, like metronidazole and disulfiram, can cause an unpleasant reaction when taken with alcohol. The reaction includes flushing, headache, nausea, vomiting, sweating, and increased thirst. The reaction can last from 30 minutes to several hours. What side effects may I notice from receiving this medicine? Side effects that you should report to your doctor or health care professional as soon as possible: -allergic reactions like skin rash, itching or hives, swelling of the face, lips, or tongue -low blood counts - This drug may decrease the number of white blood cells, red blood cells and platelets. You may be at increased risk for infections and bleeding. -signs of infection - fever or chills, cough, sore throat, pain or difficulty passing urine -signs of decreased platelets or bleeding - bruising, pinpoint red spots on the skin, black, tarry stools, nosebleeds -signs of decreased red blood cells - unusually weak or tired, fainting spells, lightheadedness -breathing problems -chest  pain -high or low blood pressure -mouth sores -nausea and vomiting -pain, swelling, redness or irritation at the injection site -pain, tingling, numbness in the hands or feet -slow or irregular heartbeat -swelling of the ankle, feet, hands Side effects that usually do not require medical attention (report to your doctor or health care professional if they continue or are bothersome): -bone  pain -complete hair loss including hair on your head, underarms, pubic hair, eyebrows, and eyelashes -changes in the color of fingernails -diarrhea -loosening of the fingernails -loss of appetite -muscle or joint pain -red flush to skin -sweating This list may not describe all possible side effects. Call your doctor for medical advice about side effects. You may report side effects to FDA at 1-800-FDA-1088. Where should I keep my medicine? This drug is given in a hospital or clinic and will not be stored at home. NOTE: This sheet is a summary. It may not cover all possible information. If you have questions about this medicine, talk to your doctor, pharmacist, or health care provider.  2018 Elsevier/Gold Standard (2015-08-04 19:58:00)

## 2018-09-04 NOTE — Anesthesia Postprocedure Evaluation (Signed)
Anesthesia Post Note  Patient: Andrea Clarke  Procedure(s) Performed: INSERTION PORT-A-CATH WITH [place] (Right )     Patient location during evaluation: PACU Anesthesia Type: General Level of consciousness: awake and alert Pain management: pain level controlled Vital Signs Assessment: post-procedure vital signs reviewed and stable Respiratory status: spontaneous breathing, nonlabored ventilation, respiratory function stable and patient connected to nasal cannula oxygen Cardiovascular status: blood pressure returned to baseline and stable Postop Assessment: no apparent nausea or vomiting Anesthetic complications: no    Last Vitals:  Vitals:   09/03/18 1513 09/03/18 1525  BP: (!) 157/75 (!) 152/82  Pulse: (!) 59   Resp: 18   Temp:  36.5 C  SpO2: 97% 97%    Last Pain:  Vitals:   09/03/18 1525  TempSrc:   PainSc: 0-No pain   Pain Goal: Patients Stated Pain Goal: 5 (09/03/18 1239)               Hideko Esselman L Bernardina Cacho

## 2018-09-06 NOTE — Progress Notes (Signed)
Patient Care Team: [name], MD as PCP - General (Internal Medicine)  DIAGNOSIS:    ICD-10-CM   1. Malignant neoplasm of upper-inner quadrant of left breast in female, estrogen receptor negative ([place]) C50.212    Z17.1     SUMMARY OF ONCOLOGIC HISTORY:   Malignant neoplasm of upper-inner quadrant of left breast in female, estrogen receptor negative ([place])   08/17/2018 Initial Diagnosis    Screening mammogram detected abnormality in the left breast by ultrasound irregular mass 10:00 position 1.3 cm, 10 o'clock position no enlarged lymph nodes, ER 0%, PR 0%, Ki-67 80%, HER-2 +3+ by [facility]    08/28/2018 Cancer Staging    Staging form: Breast, AJCC 8th Edition - Clinical stage from 08/28/2018: Stage IA (cT1c, cN0, cM0, G3, ER-, PR-, HER2+) - Signed by Nicholas Lose, MD on 08/28/2018    08/28/2018 Breast MRI    malignancy within the UPPER INNER LEFT breast with dominant mass measuring 2.1 cm. With small adjacent satellite nodules, the entire area 1.5 x 3 x 2.2 cm.No evidence of abnormal lymph nodes or RIGHT breast malignancy.    09/03/2018 -  Chemotherapy    Weekly Taxol and Herceptin followed by maintenance Herceptin for one year.      CHIEF COMPLIANT: Cycle 2 of Taxol with Herceptin  INTERVAL HISTORY: Andrea Clarke is a 72 y.o. with above-mentioned history of left breast cancer. An MRI on 08/28/18 showed the known left breast mass with small adjacent nodules and no evidence of abnormal lymph nodes or right breast malignancy. An ECHO on 08/31/18 showed ejection fraction in the range of 55% to 60%. She had port placement on 09/03/18 followed by cycle 1 of treatment with Taxol and Herceptin.   She presents to the clinic today for Cycle 2 with her husband and daughter and notes treatment was difficult. She says the day after chemo she had severe abdominal cramps and constipation followed by five or six episodes of diarrhea and one wave of nausea.She did not take any medication for  the constipation or diaherrea. She is currently constipated, last bowel movement was two days ago. She notes fatigue for several days following treatment. She notes her vision was blurry, she couldn't read her cell phone, but this resolved two days ago. Her most recent labs show WBC, HG, and platelets WNL.    REVIEW OF SYSTEMS:   Constitutional: Denies fevers, chills or abnormal weight loss (+) fatigue Eyes: (+) blurriness of vision, resolved Ears, nose, mouth, throat, and face: Denies mucositis or sore throat Respiratory: Denies cough, dyspnea or wheezes Cardiovascular: Denies palpitation, chest discomfort Gastrointestinal:  Denies heartburn (+) moderate constipation (+) diarrhea, resolved (+) severe abdominal cramps, resolved (+) occasional nausea Skin: Denies abnormal skin rashes Lymphatics: Denies new lymphadenopathy or easy bruising Neurological:Denies numbness, tingling or new weaknesses Behavioral/Psych: Mood is stable, no new changes  Extremities: No lower extremity edema Breast: denies any pain or lumps or nodules in either breasts All other systems were reviewed with the patient and are negative.  I have reviewed the past medical history, past surgical history, social history and family history with the patient and they are unchanged from previous note.  ALLERGIES:  is allergic to adhesive [tape]; oxycodone hcl; augmentin [amoxicillin-pot clavulanate]; and xylocaine [lidocaine hcl].  MEDICATIONS:  Current Outpatient Medications  Medication Sig Dispense Refill  . acetaminophen (TYLENOL) 500 MG tablet Take 500-1,000 mg by mouth daily as needed for moderate pain or headache.    Marland Kitchen atorvastatin (LIPITOR) 20 MG tablet  Take 20 mg by mouth daily.    . brimonidine-timolol (COMBIGAN) 0.2-0.5 % ophthalmic solution Place 1 drop into both eyes every 12 (twelve) hours.    Marland Kitchen CALCIUM PO Take 1 tablet by mouth at bedtime.    . cetirizine (ZYRTEC) 10 MG tablet Take 10 mg by mouth daily.    .  Cholecalciferol (VITAMIN D3) 125 MCG (5000 UT) CAPS Take 5,000 Units by mouth every evening.    . Coenzyme Q10 (COQ-10 PO) Take 1 tablet by mouth every evening.    Marland Kitchen dexlansoprazole (DEXILANT) 60 MG capsule Take 60 mg by mouth daily.    Marland Kitchen diltiazem (CARDIZEM CD) 120 MG 24 hr capsule Take 1 capsule (120 mg total) by mouth daily. 90 capsule 3  . flecainide (TAMBOCOR) 100 MG tablet TAKE 1 TABLET BY MOUTH TWICE DAILY. NEW MEDICATION. (Patient taking differently: Take 100 mg by mouth 2 (two) times daily. ) 180 tablet 2  . fluticasone (FLONASE) 50 MCG/ACT nasal spray Place 1 spray into both nostrils daily as needed for allergies.     . furosemide (LASIX) 20 MG tablet Take 1 tablet (20 mg total) by mouth daily. 90 tablet 0  . KRILL OIL PO Take 1 capsule by mouth at bedtime.    . lidocaine-prilocaine (EMLA) cream Apply to affected area once 30 g 3  . LORazepam (ATIVAN) 0.5 MG tablet Take 1 tablet (0.5 mg total) by mouth at bedtime as needed (Nausea or vomiting). 30 tablet 0  . montelukast (SINGULAIR) 10 MG tablet Take 10 mg by mouth daily.    . Multiple Vitamin (MULTIVITAMIN) tablet Take 1 tablet by mouth every evening.     . ondansetron (ZOFRAN) 8 MG tablet Take 1 tablet (8 mg total) by mouth 2 (two) times daily as needed (Nausea or vomiting). 30 tablet 1  . Polyethyl Glycol-Propyl Glycol (SYSTANE OP) Place 1 drop into both eyes 2 (two) times daily as needed (dry eyes).    . prochlorperazine (COMPAZINE) 10 MG tablet TAKE 1 TABLET(10 MG) BY MOUTH EVERY 6 HOURS AS NEEDED FOR NAUSEA OR VOMITING (Patient taking differently: Take 10 mg by mouth every 6 (six) hours as needed for nausea or vomiting. ) 385 tablet 1  . quinapril (ACCUPRIL) 20 MG tablet TAKE 1 TABLET(20 MG) BY MOUTH EVERY MORNING 90 tablet 0  . traMADol (ULTRAM) 50 MG tablet Take 1 tablet (50 mg total) by mouth every 6 (six) hours as needed. 10 tablet 0   No current facility-administered medications for this visit.    Facility-Administered  Medications Ordered in Other Visits  Medication Dose Route Frequency Provider Last Rate Last Dose  . sodium chloride flush (NS) 0.9 % injection 10 mL  10 mL Intracatheter Once Nicholas Lose, MD        PHYSICAL EXAMINATION: ECOG PERFORMANCE STATUS: 1 - Symptomatic but completely ambulatory  Vitals:   09/11/18 0846  BP: 118/74  Pulse: (!) 55  Resp: 16  Temp: 97.7 F (36.5 C)  SpO2: 99%   Filed Weights   09/11/18 0846  Weight: 165 lb 1.6 oz (74.9 kg)    GENERAL:alert, no distress and comfortable SKIN: skin color, texture, turgor are normal, no rashes or significant lesions EYES: normal, Conjunctiva are pink and non-injected, sclera clear OROPHARYNX:no exudate, no erythema and lips, buccal mucosa, and tongue normal  NECK: supple, thyroid normal size, non-tender, without nodularity LYMPH:  no palpable lymphadenopathy in the cervical, axillary or inguinal LUNGS: clear to auscultation and percussion with normal breathing effort HEART: regular rate &  rhythm and no murmurs and no lower extremity edema ABDOMEN:abdomen soft, non-tender and normal bowel sounds MUSCULOSKELETAL:no cyanosis of digits and no clubbing  NEURO: alert & oriented x 3 with fluent speech, no focal motor/sensory deficits EXTREMITIES: No lower extremity edema  LABORATORY DATA:  I have reviewed the data as listed CMP Latest Ref Rng & Units 09/03/2018 08/31/2018 08/28/2018  Glucose 70 - 99 mg/dL 129(H) 110(H) -  BUN 8 - 23 mg/dL 16 14 -  Creatinine 0.44 - 1.00 mg/dL 0.73 0.78 0.60  Sodium 135 - 145 mmol/L 136 142 -  Potassium 3.5 - 5.1 mmol/L 4.2 4.9 -  Chloride 98 - 111 mmol/L 103 104 -  CO2 22 - 32 mmol/L 23 28 -  Calcium 8.9 - 10.3 mg/dL 9.3 9.8 -  Total Protein 6.5 - 8.1 g/dL - 7.0 -  Total Bilirubin 0.3 - 1.2 mg/dL - 0.8 -  Alkaline Phos 38 - 126 U/L - 110 -  AST 15 - 41 U/L - 65(H) -  ALT 0 - 44 U/L - 112(H) -    Lab Results  Component Value Date   WBC 4.9 09/11/2018   HGB 13.5 09/11/2018   HCT  38.7 09/11/2018   MCV 96.3 09/11/2018   PLT 164 09/11/2018   NEUTROABS 3.0 09/11/2018    ASSESSMENT & PLAN:  Malignant neoplasm of upper-inner quadrant of left breast in female, estrogen receptor negative (Andrea Clarke) 08/17/2018:Screening mammogram detected abnormality in the left breast by ultrasound irregular mass 10:00 position 1.3 cm, 10 o'clock position no enlarged lymph nodes, ER 0%, PR 0%, Ki-67 80%, HER-2 +3+ by IHC, grade 3, lymphovascular invasion present, T1c N0 stage Ia clinical stage  Treatment plan: 1.  Neo-adjuvant chemotherapy with Herceptin followed by Herceptin maintenance for 1 year 2. Breast conserving surgery with sentinel lymph node biopsy 3.  Followed by radiation ----------------------------------------------------------------- Current treatment: Taxol Herceptin cycle 2 Chemo toxicities: 1.  Constipation alternating with diarrhea currently she is more constipated.  I encouraged her to take stool softener Colace. 2.  Fatigue due to diarrhea 3.  Mild nausea  Monitoring closely for chemo toxicities Return to clinic weekly for chemo every 2 weeks for follow-up with me   No orders of the defined types were placed in this encounter.  The patient has a good understanding of the overall plan. she agrees with it. she will call with any problems that may develop before the next visit here.  Nicholas Lose, MD 09/11/2018   I, Cloyde Reams Dorshimer, am acting as scribe for Nicholas Lose, MD.  I have reviewed the above documentation for accuracy and completeness, and I agree with the above.

## 2018-09-07 ENCOUNTER — Ambulatory Visit: Payer: PRIVATE HEALTH INSURANCE

## 2018-09-11 ENCOUNTER — Inpatient Hospital Stay: Payer: PRIVATE HEALTH INSURANCE

## 2018-09-11 ENCOUNTER — Inpatient Hospital Stay (HOSPITAL_BASED_OUTPATIENT_CLINIC_OR_DEPARTMENT_OTHER): Payer: PRIVATE HEALTH INSURANCE | Admitting: Hematology and Oncology

## 2018-09-11 ENCOUNTER — Encounter: Payer: Self-pay | Admitting: *Deleted

## 2018-09-11 ENCOUNTER — Other Ambulatory Visit: Payer: Self-pay | Admitting: Hematology and Oncology

## 2018-09-11 VITALS — BP 133/78 | HR 60 | Temp 98.2°F | Resp 18

## 2018-09-11 DIAGNOSIS — C50212 Malignant neoplasm of upper-inner quadrant of left female breast: Secondary | ICD-10-CM

## 2018-09-11 DIAGNOSIS — K5900 Constipation, unspecified: Secondary | ICD-10-CM

## 2018-09-11 DIAGNOSIS — R197 Diarrhea, unspecified: Secondary | ICD-10-CM

## 2018-09-11 DIAGNOSIS — R110 Nausea: Secondary | ICD-10-CM

## 2018-09-11 DIAGNOSIS — R530 Neoplastic (malignant) related fatigue: Secondary | ICD-10-CM | POA: Diagnosis not present

## 2018-09-11 DIAGNOSIS — Z171 Estrogen receptor negative status [ER-]: Secondary | ICD-10-CM

## 2018-09-11 DIAGNOSIS — Z5111 Encounter for antineoplastic chemotherapy: Secondary | ICD-10-CM | POA: Diagnosis not present

## 2018-09-11 LAB — CMP (CANCER CENTER ONLY)
ALBUMIN: 3.6 g/dL (ref 3.5–5.0)
ALT: 164 U/L — AB (ref 0–44)
ANION GAP: 8 (ref 5–15)
AST: 68 U/L — ABNORMAL HIGH (ref 15–41)
Alkaline Phosphatase: 90 U/L (ref 38–126)
BUN: 19 mg/dL (ref 8–23)
CHLORIDE: 105 mmol/L (ref 98–111)
CO2: 24 mmol/L (ref 22–32)
Calcium: 9 mg/dL (ref 8.9–10.3)
Creatinine: 0.78 mg/dL (ref 0.44–1.00)
GFR, Est AFR Am: >60 mL/min (ref >60)
GFR, Estimated: >60 mL/min (ref >60)
GLUCOSE: 223 mg/dL — AB (ref 70–99)
Potassium: 4.1 mmol/L (ref 3.5–5.1)
SODIUM: 137 mmol/L (ref 135–145)
Total Bilirubin: 0.7 mg/dL (ref 0.3–1.2)
Total Protein: 6.3 g/dL — ABNORMAL LOW (ref 6.5–8.1)

## 2018-09-11 LAB — CBC WITH DIFFERENTIAL (CANCER CENTER ONLY)
ABS IMMATURE GRANULOCYTES: 0.02 10*3/uL (ref 0.00–0.07)
BASOS ABS: 0 10*3/uL (ref 0.0–0.1)
Basophils Relative: 0 %
Eosinophils Absolute: 0.2 10*3/uL (ref 0.0–0.5)
Eosinophils Relative: 4 %
HEMATOCRIT: 38.7 % (ref 36.0–46.0)
HEMOGLOBIN: 13.5 g/dL (ref 12.0–15.0)
IMMATURE GRANULOCYTES: 0 %
LYMPHS ABS: 1.5 10*3/uL (ref 0.7–4.0)
LYMPHS PCT: 30 %
MCH: 33.6 pg (ref 26.0–34.0)
MCHC: 34.9 g/dL (ref 30.0–36.0)
MCV: 96.3 fL (ref 80.0–100.0)
Monocytes Absolute: 0.2 10*3/uL (ref 0.1–1.0)
Monocytes Relative: 5 %
NEUTROS PCT: 61 %
Neutro Abs: 3 10*3/uL (ref 1.7–7.7)
Platelet Count: 164 10*3/uL (ref 150–400)
RBC: 4.02 MIL/uL (ref 3.87–5.11)
RDW: 11.5 % (ref 11.5–15.5)
WBC Count: 4.9 10*3/uL (ref 4.0–10.5)
nRBC: 0 % (ref 0.0–0.2)

## 2018-09-11 MED ORDER — ACETAMINOPHEN 325 MG PO TABS
650.0000 mg | ORAL_TABLET | Freq: Once | ORAL | Status: AC
  Administered 2018-09-11: 650 mg via ORAL

## 2018-09-11 MED ORDER — ACETAMINOPHEN 325 MG PO TABS
ORAL_TABLET | ORAL | Status: AC
  Filled 2018-09-11: qty 2

## 2018-09-11 MED ORDER — SODIUM CHLORIDE 0.9% FLUSH
10.0000 mL | INTRAVENOUS | Status: DC | PRN
  Administered 2018-09-11: 10 mL
  Filled 2018-09-11: qty 10

## 2018-09-11 MED ORDER — DIPHENHYDRAMINE HCL 50 MG/ML IJ SOLN
INTRAMUSCULAR | Status: AC
  Filled 2018-09-11: qty 1

## 2018-09-11 MED ORDER — TRASTUZUMAB CHEMO 150 MG IV SOLR
150.0000 mg | Freq: Once | INTRAVENOUS | Status: AC
  Administered 2018-09-11: 150 mg via INTRAVENOUS
  Filled 2018-09-11: qty 7.14

## 2018-09-11 MED ORDER — DIPHENHYDRAMINE HCL 50 MG/ML IJ SOLN
50.0000 mg | Freq: Once | INTRAMUSCULAR | Status: AC
  Administered 2018-09-11: 50 mg via INTRAVENOUS

## 2018-09-11 MED ORDER — DIPHENHYDRAMINE HCL 25 MG PO CAPS
ORAL_CAPSULE | ORAL | Status: AC
  Filled 2018-09-11: qty 2

## 2018-09-11 MED ORDER — FAMOTIDINE IN NACL 20-0.9 MG/50ML-% IV SOLN
20.0000 mg | Freq: Once | INTRAVENOUS | Status: AC
  Administered 2018-09-11: 20 mg via INTRAVENOUS

## 2018-09-11 MED ORDER — SODIUM CHLORIDE 0.9 % IV SOLN
Freq: Once | INTRAVENOUS | Status: AC
  Administered 2018-09-11: 10:00:00 via INTRAVENOUS
  Filled 2018-09-11: qty 250

## 2018-09-11 MED ORDER — SODIUM CHLORIDE 0.9 % IV SOLN
20.0000 mg | Freq: Once | INTRAVENOUS | Status: AC
  Administered 2018-09-11: 20 mg via INTRAVENOUS
  Filled 2018-09-11: qty 2

## 2018-09-11 MED ORDER — ACETAMINOPHEN 500 MG PO TABS
ORAL_TABLET | ORAL | Status: AC
  Filled 2018-09-11: qty 2

## 2018-09-11 MED ORDER — FAMOTIDINE IN NACL 20-0.9 MG/50ML-% IV SOLN
INTRAVENOUS | Status: AC
  Filled 2018-09-11: qty 50

## 2018-09-11 MED ORDER — HEPARIN SOD (PORK) LOCK FLUSH 100 UNIT/ML IV SOLN
500.0000 [IU] | Freq: Once | INTRAVENOUS | Status: AC | PRN
  Administered 2018-09-11: 500 [IU]
  Filled 2018-09-11: qty 5

## 2018-09-11 MED ORDER — SODIUM CHLORIDE 0.9 % IV SOLN
70.0000 mg/m2 | Freq: Once | INTRAVENOUS | Status: AC
  Administered 2018-09-11: 126 mg via INTRAVENOUS
  Filled 2018-09-11: qty 21

## 2018-09-11 NOTE — Patient Instructions (Signed)
[place] Discharge Instructions for Patients Receiving Chemotherapy  Today you received the following chemotherapy agents Trastuzumab (HERCEPTIN) & Paclitaxel (TAXOL).  To help prevent nausea and vomiting after your treatment, we encourage you to take your nausea medication as prescribed.   If you develop nausea and vomiting that is not controlled by your nausea medication, call the clinic.   BELOW ARE SYMPTOMS THAT SHOULD BE REPORTED IMMEDIATELY:  *FEVER GREATER THAN 100.5 F  *CHILLS WITH OR WITHOUT FEVER  NAUSEA AND VOMITING THAT IS NOT CONTROLLED WITH YOUR NAUSEA MEDICATION  *UNUSUAL SHORTNESS OF BREATH  *UNUSUAL BRUISING OR BLEEDING  TENDERNESS IN MOUTH AND THROAT WITH OR WITHOUT PRESENCE OF ULCERS  *URINARY PROBLEMS  *BOWEL PROBLEMS  UNUSUAL RASH Items with * indicate a potential emergency and should be followed up as soon as possible.  Feel free to call the clinic should you have any questions or concerns. The clinic phone number is [phone].  Please show the [place] at check-in to the Emergency Department and triage nurse.

## 2018-09-11 NOTE — Progress Notes (Signed)
ALT is 162.  Patient started at 112 on the ALT We will reduce the dosage of chemo and will proceed with treatment

## 2018-09-11 NOTE — Assessment & Plan Note (Signed)
08/17/2018:Screening mammogram detected abnormality in the left breast by ultrasound irregular mass 10:00 position 1.3 cm, 10 o'clock position no enlarged lymph nodes, ER 0%, PR 0%, Ki-67 80%, HER-2 +3+ by IHC, grade 3, lymphovascular invasion present, T1c N0 stage Ia clinical stage  Treatment plan: 1.  Neo-adjuvant chemotherapy with Herceptin followed by Herceptin maintenance for 1 year 2. Breast conserving surgery with sentinel lymph node biopsy ----------------------------------------------------------------- Current treatment: Taxol Herceptin cycle 2 Chemo toxicities:  Monitoring closely for chemo toxicities Return to clinic weekly for chemo every 2 weeks for follow-up with me 3.  Followed by radiation  

## 2018-09-14 ENCOUNTER — Telehealth: Payer: Self-pay | Admitting: Adult Health

## 2018-09-14 NOTE — Telephone Encounter (Signed)
Called regarding 12/3

## 2018-09-18 ENCOUNTER — Inpatient Hospital Stay: Payer: PRIVATE HEALTH INSURANCE | Attending: Hematology and Oncology

## 2018-09-18 ENCOUNTER — Encounter: Payer: Self-pay | Admitting: *Deleted

## 2018-09-18 ENCOUNTER — Other Ambulatory Visit: Payer: PRIVATE HEALTH INSURANCE

## 2018-09-18 ENCOUNTER — Inpatient Hospital Stay: Payer: PRIVATE HEALTH INSURANCE

## 2018-09-18 DIAGNOSIS — Z95828 Presence of other vascular implants and grafts: Secondary | ICD-10-CM

## 2018-09-18 DIAGNOSIS — Z5111 Encounter for antineoplastic chemotherapy: Secondary | ICD-10-CM | POA: Insufficient documentation

## 2018-09-18 DIAGNOSIS — C50212 Malignant neoplasm of upper-inner quadrant of left female breast: Secondary | ICD-10-CM | POA: Insufficient documentation

## 2018-09-18 DIAGNOSIS — Z5112 Encounter for antineoplastic immunotherapy: Secondary | ICD-10-CM | POA: Insufficient documentation

## 2018-09-18 DIAGNOSIS — Z171 Estrogen receptor negative status [ER-]: Secondary | ICD-10-CM

## 2018-09-18 LAB — CBC WITH DIFFERENTIAL (CANCER CENTER ONLY)
ABS IMMATURE GRANULOCYTES: 0.06 10*3/uL (ref 0.00–0.07)
BASOS PCT: 1 %
Basophils Absolute: 0 10*3/uL (ref 0.0–0.1)
EOS ABS: 0.1 10*3/uL (ref 0.0–0.5)
EOS PCT: 3 %
HCT: 38.3 % (ref 36.0–46.0)
Hemoglobin: 13.7 g/dL (ref 12.0–15.0)
Immature Granulocytes: 2 %
Lymphocytes Relative: 54 %
Lymphs Abs: 1.8 10*3/uL (ref 0.7–4.0)
MCH: 34.2 pg — AB (ref 26.0–34.0)
MCHC: 35.8 g/dL (ref 30.0–36.0)
MCV: 95.5 fL (ref 80.0–100.0)
MONO ABS: 0.3 10*3/uL (ref 0.1–1.0)
MONOS PCT: 9 %
NEUTROS ABS: 1 10*3/uL — AB (ref 1.7–7.7)
Neutrophils Relative %: 31 %
PLATELETS: 185 10*3/uL (ref 150–400)
RBC: 4.01 MIL/uL (ref 3.87–5.11)
RDW: 11.8 % (ref 11.5–15.5)
WBC: 3.3 10*3/uL — AB (ref 4.0–10.5)
nRBC: 0 % (ref 0.0–0.2)

## 2018-09-18 LAB — CMP (CANCER CENTER ONLY)
ALBUMIN: 3.9 g/dL (ref 3.5–5.0)
ALK PHOS: 103 U/L (ref 38–126)
ALT: 185 U/L — ABNORMAL HIGH (ref 0–44)
AST: 92 U/L — AB (ref 15–41)
Anion gap: 9 (ref 5–15)
BILIRUBIN TOTAL: 0.7 mg/dL (ref 0.3–1.2)
BUN: 11 mg/dL (ref 8–23)
CALCIUM: 9.1 mg/dL (ref 8.9–10.3)
CO2: 26 mmol/L (ref 22–32)
Chloride: 104 mmol/L (ref 98–111)
Creatinine: 0.7 mg/dL (ref 0.44–1.00)
GFR, Est AFR Am: >60 mL/min (ref >60)
GFR, Estimated: >60 mL/min (ref >60)
GLUCOSE: 91 mg/dL (ref 70–99)
POTASSIUM: 4 mmol/L (ref 3.5–5.1)
Sodium: 139 mmol/L (ref 135–145)
TOTAL PROTEIN: 6.6 g/dL (ref 6.5–8.1)

## 2018-09-18 MED ORDER — SODIUM CHLORIDE 0.9% FLUSH
10.0000 mL | Freq: Once | INTRAVENOUS | Status: AC
  Administered 2018-09-18: 10 mL
  Filled 2018-09-18: qty 10

## 2018-09-18 MED ORDER — HEPARIN SOD (PORK) LOCK FLUSH 100 UNIT/ML IV SOLN
500.0000 [IU] | Freq: Once | INTRAVENOUS | Status: AC
  Administered 2018-09-18: 500 [IU]
  Filled 2018-09-18: qty 5

## 2018-09-19 ENCOUNTER — Other Ambulatory Visit: Payer: Self-pay | Admitting: Hematology and Oncology

## 2018-09-19 ENCOUNTER — Inpatient Hospital Stay: Payer: PRIVATE HEALTH INSURANCE

## 2018-09-19 VITALS — BP 124/73 | HR 58 | Temp 98.2°F | Resp 17 | Wt 163.5 lb

## 2018-09-19 DIAGNOSIS — Z171 Estrogen receptor negative status [ER-]: Principal | ICD-10-CM

## 2018-09-19 DIAGNOSIS — C50212 Malignant neoplasm of upper-inner quadrant of left female breast: Secondary | ICD-10-CM

## 2018-09-19 DIAGNOSIS — Z5112 Encounter for antineoplastic immunotherapy: Secondary | ICD-10-CM | POA: Diagnosis not present

## 2018-09-19 LAB — CBC WITH DIFFERENTIAL (CANCER CENTER ONLY)
Abs Immature Granulocytes: 0.05 10*3/uL (ref 0.00–0.07)
Basophils Absolute: 0 10*3/uL (ref 0.0–0.1)
Basophils Relative: 1 %
Eosinophils Absolute: 0.1 10*3/uL (ref 0.0–0.5)
Eosinophils Relative: 3 %
HCT: 34.3 % — ABNORMAL LOW (ref 36.0–46.0)
Hemoglobin: 12 g/dL (ref 12.0–15.0)
IMMATURE GRANULOCYTES: 2 %
Lymphocytes Relative: 45 %
Lymphs Abs: 1.5 10*3/uL (ref 0.7–4.0)
MCH: 34 pg (ref 26.0–34.0)
MCHC: 35 g/dL (ref 30.0–36.0)
MCV: 97.2 fL (ref 80.0–100.0)
Monocytes Absolute: 0.3 10*3/uL (ref 0.1–1.0)
Monocytes Relative: 9 %
NEUTROS PCT: 40 %
Neutro Abs: 1.3 10*3/uL — ABNORMAL LOW (ref 1.7–7.7)
Platelet Count: 165 10*3/uL (ref 150–400)
RBC: 3.53 MIL/uL — ABNORMAL LOW (ref 3.87–5.11)
RDW: 12 % (ref 11.5–15.5)
WBC Count: 3.3 10*3/uL — ABNORMAL LOW (ref 4.0–10.5)
nRBC: 0.6 % — ABNORMAL HIGH (ref 0.0–0.2)

## 2018-09-19 LAB — CMP (CANCER CENTER ONLY)
ALT: 156 U/L — ABNORMAL HIGH (ref 0–44)
AST: 73 U/L — ABNORMAL HIGH (ref 15–41)
Albumin: 3.4 g/dL — ABNORMAL LOW (ref 3.5–5.0)
Alkaline Phosphatase: 88 U/L (ref 38–126)
Anion gap: 8 (ref 5–15)
BUN: 11 mg/dL (ref 8–23)
CO2: 24 mmol/L (ref 22–32)
Calcium: 8.7 mg/dL — ABNORMAL LOW (ref 8.9–10.3)
Chloride: 107 mmol/L (ref 98–111)
Creatinine: 0.7 mg/dL (ref 0.44–1.00)
GFR, Est AFR Am: >60 mL/min (ref >60)
GFR, Estimated: >60 mL/min (ref >60)
Glucose, Bld: 187 mg/dL — ABNORMAL HIGH (ref 70–99)
POTASSIUM: 4 mmol/L (ref 3.5–5.1)
SODIUM: 139 mmol/L (ref 135–145)
Total Bilirubin: 0.5 mg/dL (ref 0.3–1.2)
Total Protein: 5.7 g/dL — ABNORMAL LOW (ref 6.5–8.1)

## 2018-09-19 MED ORDER — DIPHENHYDRAMINE HCL 50 MG/ML IJ SOLN
INTRAMUSCULAR | Status: AC
  Filled 2018-09-19: qty 1

## 2018-09-19 MED ORDER — DIPHENHYDRAMINE HCL 50 MG/ML IJ SOLN
50.0000 mg | Freq: Once | INTRAMUSCULAR | Status: AC
  Administered 2018-09-19: 50 mg via INTRAVENOUS

## 2018-09-19 MED ORDER — ACETAMINOPHEN 325 MG PO TABS
650.0000 mg | ORAL_TABLET | Freq: Once | ORAL | Status: AC
  Administered 2018-09-19: 650 mg via ORAL

## 2018-09-19 MED ORDER — SODIUM CHLORIDE 0.9 % IV SOLN
Freq: Once | INTRAVENOUS | Status: AC
  Administered 2018-09-19: 10:00:00 via INTRAVENOUS
  Filled 2018-09-19: qty 250

## 2018-09-19 MED ORDER — FAMOTIDINE IN NACL 20-0.9 MG/50ML-% IV SOLN
INTRAVENOUS | Status: AC
  Filled 2018-09-19: qty 50

## 2018-09-19 MED ORDER — SODIUM CHLORIDE 0.9 % IV SOLN
50.0000 mg/m2 | Freq: Once | INTRAVENOUS | Status: AC
  Administered 2018-09-19: 90 mg via INTRAVENOUS
  Filled 2018-09-19: qty 15

## 2018-09-19 MED ORDER — SODIUM CHLORIDE 0.9% FLUSH
10.0000 mL | INTRAVENOUS | Status: DC | PRN
  Administered 2018-09-19: 10 mL
  Filled 2018-09-19: qty 10

## 2018-09-19 MED ORDER — ACETAMINOPHEN 325 MG PO TABS
ORAL_TABLET | ORAL | Status: AC
  Filled 2018-09-19: qty 2

## 2018-09-19 MED ORDER — TRASTUZUMAB CHEMO 150 MG IV SOLR
150.0000 mg | Freq: Once | INTRAVENOUS | Status: AC
  Administered 2018-09-19: 150 mg via INTRAVENOUS
  Filled 2018-09-19: qty 7.14

## 2018-09-19 MED ORDER — SODIUM CHLORIDE 0.9 % IV SOLN
20.0000 mg | Freq: Once | INTRAVENOUS | Status: AC
  Administered 2018-09-19: 20 mg via INTRAVENOUS
  Filled 2018-09-19: qty 2

## 2018-09-19 MED ORDER — FAMOTIDINE IN NACL 20-0.9 MG/50ML-% IV SOLN
20.0000 mg | Freq: Once | INTRAVENOUS | Status: AC
  Administered 2018-09-19: 20 mg via INTRAVENOUS

## 2018-09-19 MED ORDER — HEPARIN SOD (PORK) LOCK FLUSH 100 UNIT/ML IV SOLN
500.0000 [IU] | Freq: Once | INTRAVENOUS | Status: AC | PRN
  Administered 2018-09-19: 500 [IU]
  Filled 2018-09-19: qty 5

## 2018-09-19 NOTE — Patient Instructions (Signed)
[facility] Discharge Instructions for Patients Receiving Chemotherapy  Today you received the following chemotherapy agents Trastuzumab (HERCEPTIN) & Paclitaxel (TAXOL).   To help prevent nausea and vomiting after your treatment, we encourage you to take your nausea medication as prescribed.  If you develop nausea and vomiting that is not controlled by your nausea medication, call the clinic.   BELOW ARE SYMPTOMS THAT SHOULD BE REPORTED IMMEDIATELY:  *FEVER GREATER THAN 100.5 F  *CHILLS WITH OR WITHOUT FEVER  NAUSEA AND VOMITING THAT IS NOT CONTROLLED WITH YOUR NAUSEA MEDICATION  *UNUSUAL SHORTNESS OF BREATH  *UNUSUAL BRUISING OR BLEEDING  TENDERNESS IN MOUTH AND THROAT WITH OR WITHOUT PRESENCE OF ULCERS  *URINARY PROBLEMS  *BOWEL PROBLEMS  UNUSUAL RASH Items with * indicate a potential emergency and should be followed up as soon as possible.  Feel free to call the clinic should you have any questions or concerns. The clinic phone number is [phone].  Please show the [facility] at check-in to the Emergency Department and triage nurse.

## 2018-09-19 NOTE — Progress Notes (Signed)
Per Dr. Lindi Adie, okay to treat patient with current labs. Will reduce dose.

## 2018-09-21 ENCOUNTER — Telehealth: Payer: Self-pay | Admitting: Adult Health

## 2018-09-21 NOTE — Telephone Encounter (Signed)
Tried to reach regarding voicemail °

## 2018-09-25 ENCOUNTER — Inpatient Hospital Stay: Payer: PRIVATE HEALTH INSURANCE

## 2018-09-25 ENCOUNTER — Inpatient Hospital Stay (HOSPITAL_BASED_OUTPATIENT_CLINIC_OR_DEPARTMENT_OTHER): Payer: PRIVATE HEALTH INSURANCE | Admitting: Adult Health

## 2018-09-25 ENCOUNTER — Encounter: Payer: Self-pay | Admitting: Adult Health

## 2018-09-25 ENCOUNTER — Encounter: Payer: Self-pay | Admitting: *Deleted

## 2018-09-25 ENCOUNTER — Telehealth: Payer: Self-pay | Admitting: Adult Health

## 2018-09-25 VITALS — BP 154/82 | HR 57 | Temp 98.0°F | Resp 18 | Ht 62.5 in | Wt 162.6 lb

## 2018-09-25 DIAGNOSIS — C50212 Malignant neoplasm of upper-inner quadrant of left female breast: Secondary | ICD-10-CM

## 2018-09-25 DIAGNOSIS — I10 Essential (primary) hypertension: Secondary | ICD-10-CM

## 2018-09-25 DIAGNOSIS — M25571 Pain in right ankle and joints of right foot: Secondary | ICD-10-CM

## 2018-09-25 DIAGNOSIS — Z5112 Encounter for antineoplastic immunotherapy: Secondary | ICD-10-CM | POA: Diagnosis not present

## 2018-09-25 DIAGNOSIS — M25572 Pain in left ankle and joints of left foot: Secondary | ICD-10-CM | POA: Diagnosis not present

## 2018-09-25 DIAGNOSIS — Z171 Estrogen receptor negative status [ER-]: Principal | ICD-10-CM

## 2018-09-25 DIAGNOSIS — Z87891 Personal history of nicotine dependence: Secondary | ICD-10-CM

## 2018-09-25 DIAGNOSIS — Z95828 Presence of other vascular implants and grafts: Secondary | ICD-10-CM

## 2018-09-25 LAB — CBC WITH DIFFERENTIAL (CANCER CENTER ONLY)
Abs Immature Granulocytes: 0.12 10*3/uL — ABNORMAL HIGH (ref 0.00–0.07)
Basophils Absolute: 0 10*3/uL (ref 0.0–0.1)
Basophils Relative: 1 %
Eosinophils Absolute: 0.1 10*3/uL (ref 0.0–0.5)
Eosinophils Relative: 2 %
HCT: 37.7 % (ref 36.0–46.0)
Hemoglobin: 13.3 g/dL (ref 12.0–15.0)
Immature Granulocytes: 2 %
Lymphocytes Relative: 40 %
Lymphs Abs: 2.2 10*3/uL (ref 0.7–4.0)
MCH: 34.4 pg — ABNORMAL HIGH (ref 26.0–34.0)
MCHC: 35.3 g/dL (ref 30.0–36.0)
MCV: 97.4 fL (ref 80.0–100.0)
MONOS PCT: 7 %
Monocytes Absolute: 0.4 10*3/uL (ref 0.1–1.0)
NEUTROS ABS: 2.7 10*3/uL (ref 1.7–7.7)
Neutrophils Relative %: 48 %
Platelet Count: 199 10*3/uL (ref 150–400)
RBC: 3.87 MIL/uL (ref 3.87–5.11)
RDW: 12.3 % (ref 11.5–15.5)
WBC Count: 5.6 10*3/uL (ref 4.0–10.5)
nRBC: 0 % (ref 0.0–0.2)

## 2018-09-25 LAB — CMP (CANCER CENTER ONLY)
ALT: 111 U/L — ABNORMAL HIGH (ref 0–44)
AST: 31 U/L (ref 15–41)
Albumin: 3.7 g/dL (ref 3.5–5.0)
Alkaline Phosphatase: 82 U/L (ref 38–126)
Anion gap: 9 (ref 5–15)
BUN: 11 mg/dL (ref 8–23)
CHLORIDE: 105 mmol/L (ref 98–111)
CO2: 23 mmol/L (ref 22–32)
Calcium: 8.8 mg/dL — ABNORMAL LOW (ref 8.9–10.3)
Creatinine: 0.79 mg/dL (ref 0.44–1.00)
GFR, Est AFR Am: >60 mL/min (ref >60)
GFR, Estimated: >60 mL/min (ref >60)
Glucose, Bld: 232 mg/dL — ABNORMAL HIGH (ref 70–99)
Potassium: 3.8 mmol/L (ref 3.5–5.1)
Sodium: 137 mmol/L (ref 135–145)
Total Bilirubin: 0.6 mg/dL (ref 0.3–1.2)
Total Protein: 6.1 g/dL — ABNORMAL LOW (ref 6.5–8.1)

## 2018-09-25 MED ORDER — DIPHENHYDRAMINE HCL 50 MG/ML IJ SOLN
INTRAMUSCULAR | Status: AC
  Filled 2018-09-25: qty 1

## 2018-09-25 MED ORDER — FAMOTIDINE IN NACL 20-0.9 MG/50ML-% IV SOLN
INTRAVENOUS | Status: AC
  Filled 2018-09-25: qty 50

## 2018-09-25 MED ORDER — SODIUM CHLORIDE 0.9% FLUSH
10.0000 mL | Freq: Once | INTRAVENOUS | Status: AC
  Administered 2018-09-25: 10 mL
  Filled 2018-09-25: qty 10

## 2018-09-25 MED ORDER — DIPHENHYDRAMINE HCL 50 MG/ML IJ SOLN
50.0000 mg | Freq: Once | INTRAMUSCULAR | Status: AC
  Administered 2018-09-25: 50 mg via INTRAVENOUS

## 2018-09-25 MED ORDER — SODIUM CHLORIDE 0.9% FLUSH
10.0000 mL | INTRAVENOUS | Status: DC | PRN
  Filled 2018-09-25: qty 10

## 2018-09-25 MED ORDER — ACETAMINOPHEN 325 MG PO TABS
ORAL_TABLET | ORAL | Status: AC
  Filled 2018-09-25: qty 2

## 2018-09-25 MED ORDER — SODIUM CHLORIDE 0.9 % IV SOLN
12.0000 mg | Freq: Once | INTRAVENOUS | Status: AC
  Administered 2018-09-25: 12 mg via INTRAVENOUS
  Filled 2018-09-25: qty 1.2

## 2018-09-25 MED ORDER — TRASTUZUMAB CHEMO 150 MG IV SOLR
150.0000 mg | Freq: Once | INTRAVENOUS | Status: AC
  Administered 2018-09-25: 150 mg via INTRAVENOUS
  Filled 2018-09-25: qty 7.14

## 2018-09-25 MED ORDER — SODIUM CHLORIDE 0.9 % IV SOLN
Freq: Once | INTRAVENOUS | Status: AC
  Administered 2018-09-25: 13:00:00 via INTRAVENOUS
  Filled 2018-09-25: qty 250

## 2018-09-25 MED ORDER — HEPARIN SOD (PORK) LOCK FLUSH 100 UNIT/ML IV SOLN
500.0000 [IU] | Freq: Once | INTRAVENOUS | Status: DC | PRN
  Filled 2018-09-25: qty 5

## 2018-09-25 MED ORDER — FAMOTIDINE IN NACL 20-0.9 MG/50ML-% IV SOLN
20.0000 mg | Freq: Once | INTRAVENOUS | Status: AC
  Administered 2018-09-25: 20 mg via INTRAVENOUS

## 2018-09-25 MED ORDER — SODIUM CHLORIDE 0.9 % IV SOLN
50.0000 mg/m2 | Freq: Once | INTRAVENOUS | Status: AC
  Administered 2018-09-25: 90 mg via INTRAVENOUS
  Filled 2018-09-25: qty 15

## 2018-09-25 MED ORDER — DEXAMETHASONE SODIUM PHOSPHATE 10 MG/ML IJ SOLN
INTRAMUSCULAR | Status: AC
  Filled 2018-09-25: qty 1

## 2018-09-25 MED ORDER — COLD PACK MISC ONCOLOGY
1.0000 | Freq: Once | Status: DC | PRN
  Filled 2018-09-25: qty 1

## 2018-09-25 MED ORDER — ACETAMINOPHEN 325 MG PO TABS
650.0000 mg | ORAL_TABLET | Freq: Once | ORAL | Status: AC
  Administered 2018-09-25: 650 mg via ORAL

## 2018-09-25 NOTE — Patient Instructions (Signed)
[facility] Discharge Instructions for Patients Receiving Chemotherapy  Today you received the following chemotherapy agents Trastuzumab (HERCEPTIN) & Paclitaxel (TAXOL).  To help prevent nausea and vomiting after your treatment, we encourage you to take your nausea medication as prescribed.   If you develop nausea and vomiting that is not controlled by your nausea medication, call the clinic.   BELOW ARE SYMPTOMS THAT SHOULD BE REPORTED IMMEDIATELY:  *FEVER GREATER THAN 100.5 F  *CHILLS WITH OR WITHOUT FEVER  NAUSEA AND VOMITING THAT IS NOT CONTROLLED WITH YOUR NAUSEA MEDICATION  *UNUSUAL SHORTNESS OF BREATH  *UNUSUAL BRUISING OR BLEEDING  TENDERNESS IN MOUTH AND THROAT WITH OR WITHOUT PRESENCE OF ULCERS  *URINARY PROBLEMS  *BOWEL PROBLEMS  UNUSUAL RASH Items with * indicate a potential emergency and should be followed up as soon as possible.  Feel free to call the clinic should you have any questions or concerns. The clinic phone number is [phone].  Please show the [facility] at check-in to the Emergency Department and triage nurse.

## 2018-09-25 NOTE — Progress Notes (Signed)
Ok to treat per NP with ALT of 111.

## 2018-09-25 NOTE — Telephone Encounter (Signed)
Per 12/10 no los °

## 2018-09-25 NOTE — Progress Notes (Signed)
[facility] Follow up:    [name], MD [address] [place] [address]   DIAGNOSIS: Cancer Staging Malignant neoplasm of upper-inner quadrant of left breast in female, estrogen receptor negative ([place]) Staging form: Breast, AJCC 8th Edition - Clinical stage from 08/28/2018: Stage IA (cT1c, cN0, cM0, G3, ER-, PR-, HER2+) - Signed by Nicholas Lose, MD on 08/28/2018   SUMMARY OF ONCOLOGIC HISTORY:   Malignant neoplasm of upper-inner quadrant of left breast in female, estrogen receptor negative ([facility])   08/17/2018 Initial Diagnosis    Screening mammogram detected abnormality in the left breast by ultrasound irregular mass 10:00 position 1.3 cm, 10 o'clock position no enlarged lymph nodes, ER 0%, PR 0%, Ki-67 80%, HER-2 +3+ by [facility]    08/28/2018 Cancer Staging    Staging form: Breast, AJCC 8th Edition - Clinical stage from 08/28/2018: Stage IA (cT1c, cN0, cM0, G3, ER-, PR-, HER2+) - Signed by Nicholas Lose, MD on 08/28/2018    08/28/2018 Breast MRI    malignancy within the UPPER INNER LEFT breast with dominant mass measuring 2.1 cm. With small adjacent satellite nodules, the entire area 1.5 x 3 x 2.2 cm.No evidence of abnormal lymph nodes or RIGHT breast malignancy.    09/03/2018 -  Chemotherapy    Weekly Taxol and Herceptin followed by maintenance Herceptin for one year.      CURRENT THERAPY: Taxol/Herceptin  INTERVAL HISTORY: Andrea Clarke 72 y.o. female returns for evaluation prior to receiving her weekly TAxol and herceptin.  She has noted an increased in achiness in her legs.  It started on Sunday and progressively worsened.  Yesterday it was at its worst.  The pain starts in her hips and extends down into her knees.  She says she feels the pain inside her bones.  She says Tramadol hasn't helped it.  She sat in a hot tub of water that didn't help. It is some better today and she is walking around without any difficulty.     Patient  Active Problem List   Diagnosis Date Noted  . Port-A-Cath in place 08/31/2018  . Malignant neoplasm of upper-inner quadrant of left breast in female, estrogen receptor negative ([place]) 08/28/2018  . Hypertension   . Hyperlipidemia   . Glaucoma   . GERD (gastroesophageal reflux disease)   . HYPERLIPIDEMIA 06/11/2007  . GLAUCOMA NOS 06/11/2007  . HTN (hypertension) 06/11/2007  . ALLERGIC RHINITIS 06/11/2007  . GERD 06/11/2007  . HERPES ZOSTER, UNCOMPLICATED 70/35/0093    is allergic to adhesive [tape]; oxycodone hcl; augmentin [amoxicillin-pot clavulanate]; and xylocaine [lidocaine hcl].  MEDICAL HISTORY: Past Medical History:  Diagnosis Date  . Atrial tachycardia ([place])   . Breast cancer ([place])    left breast cancer 2019  . 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 Allegra Lai)  . GERD (gastroesophageal reflux disease)   . Glaucoma   . Hyperlipidemia   . Hypertension   . PONV (postoperative nausea and vomiting)     SURGICAL HISTORY: Past Surgical History:  Procedure Laterality Date  . ABDOMINAL HYSTERECTOMY    . ANKLE SURGERY    . CATARACT EXTRACTION W/ INTRAOCULAR LENS IMPLANT Bilateral   . PORTACATH PLACEMENT Right 09/03/2018   Procedure: INSERTION PORT-A-CATH WITH [place];  Surgeon: Rolm Bookbinder, MD;  Location: [facility];  Service: General;  Laterality: Right;  . ROTATOR CUFF REPAIR Right     SOCIAL HISTORY: Social History   Socioeconomic History  .  Marital status: Married    Spouse name: Not on file  . Number of children: Not on file  . Years of education: Not on file  . Highest education level: Not on file  Occupational History  . Not on file  Social Needs  . Financial resource strain: Not on file  . Food insecurity:    Worry: Not on file    Inability: Not on file  . Transportation needs:    Medical: Not on file    Non-medical: Not on file  Tobacco Use  . Smoking status: Former Research scientist (life sciences)  . Smokeless tobacco: Never Used   Substance and Sexual Activity  . Alcohol use: Yes    Comment: glass of wine a day  . Drug use: No  . Sexual activity: Not on file  Lifestyle  . Physical activity:    Days per week: Not on file    Minutes per session: Not on file  . Stress: Not on file  Relationships  . Social connections:    Talks on phone: Not on file    Gets together: Not on file    Attends religious service: Not on file    Active member of club or organization: Not on file    Attends meetings of clubs or organizations: Not on file    Relationship status: Not on file  . Intimate partner violence:    Fear of current or ex partner: Not on file    Emotionally abused: Not on file    Physically abused: Not on file    Forced sexual activity: Not on file  Other Topics Concern  . Not on file  Social History Narrative  . Not on file    FAMILY HISTORY: Family History  Problem Relation Age of Onset  . Hypertension Mother   . Breast cancer Neg Hx     Review of Systems  Constitutional: Positive for fatigue. Negative for appetite change, chills, fever and unexpected weight change.  HENT:   Negative for hearing loss and lump/mass.   Eyes: Negative for eye problems and icterus.  Respiratory: Negative for chest tightness, cough and shortness of breath.   Cardiovascular: Negative for chest pain, leg swelling and palpitations.  Gastrointestinal: Negative for abdominal distention, abdominal pain, constipation, diarrhea, nausea and vomiting.  Endocrine: Negative for hot flashes.  Skin: Negative for itching and rash.  Neurological: Negative for dizziness, extremity weakness, headaches and numbness.  Hematological: Negative for adenopathy. Does not bruise/bleed easily.  Psychiatric/Behavioral: Negative for depression. The patient is not nervous/anxious.       PHYSICAL EXAMINATION  ECOG PERFORMANCE STATUS: 1 - Symptomatic but completely ambulatory  Vitals:   09/25/18 1044  BP: (!) 154/82  Pulse: (!) 57  Resp:  18  Temp: 98 F (36.7 C)  SpO2: 97%    Physical Exam  Constitutional: She is oriented to person, place, and time. She appears well-developed and well-nourished.  HENT:  Head: Normocephalic and atraumatic.  Mouth/Throat: Oropharynx is clear and moist. No oropharyngeal exudate.  Eyes: Pupils are equal, round, and reactive to light. No scleral icterus.  Neck: Neck supple.  Cardiovascular: Normal rate, regular rhythm and normal heart sounds.  Pulmonary/Chest: Effort normal and breath sounds normal.  Unable to palpate left breast mass  Abdominal: Soft. Bowel sounds are normal.  Musculoskeletal: She exhibits no edema.  Lymphadenopathy:    She has no cervical adenopathy.  Neurological: She is alert and oriented to person, place, and time.  Skin: Skin is warm and dry. Capillary  refill takes less than 2 seconds.  Psychiatric: She has a normal mood and affect.    LABORATORY DATA:  CBC    Component Value Date/Time   WBC 5.6 09/25/2018 0931   WBC 5.7 09/03/2018 1237   RBC 3.87 09/25/2018 0931   HGB 13.3 09/25/2018 0931   HCT 37.7 09/25/2018 0931   PLT 199 09/25/2018 0931   MCV 97.4 09/25/2018 0931   MCH 34.4 (H) 09/25/2018 0931   MCHC 35.3 09/25/2018 0931   RDW 12.3 09/25/2018 0931   LYMPHSABS 2.2 09/25/2018 0931   MONOABS 0.4 09/25/2018 0931   EOSABS 0.1 09/25/2018 0931   BASOSABS 0.0 09/25/2018 0931    CMP     Component Value Date/Time   NA 137 09/25/2018 0931   K 3.8 09/25/2018 0931   CL 105 09/25/2018 0931   CO2 23 09/25/2018 0931   GLUCOSE 232 (H) 09/25/2018 0931   BUN 11 09/25/2018 0931   CREATININE 0.79 09/25/2018 0931   CALCIUM 8.8 (L) 09/25/2018 0931   PROT 6.1 (L) 09/25/2018 0931   ALBUMIN 3.7 09/25/2018 0931   AST 31 09/25/2018 0931   ALT 111 (H) 09/25/2018 0931   ALKPHOS 82 09/25/2018 0931   BILITOT 0.6 09/25/2018 0931   GFRNONAA >60 09/25/2018 0931   GFRAA >60 09/25/2018 0931     ASSESSMENT and PLAN:   Malignant neoplasm of upper-inner quadrant  of left breast in female, estrogen receptor negative ([place]) 08/17/2018:Screening mammogram detected abnormality in the left breast by ultrasound irregular mass 10:00 position 1.3 cm, 10 o'clock position no enlarged lymph nodes, ER 0%, PR 0%, Ki-67 80%, HER-2 +3+ by IHC, grade 3, lymphovascular invasion present, T1c N0 stage Ia clinical stage  Treatment plan: 1.  Neo-adjuvant chemotherapy with Herceptin followed by Herceptin maintenance for 1 year 2. Breast conserving surgery with sentinel lymph node biopsy ----------------------------------------------------------------- Current treatment: Taxol Herceptin cycle 4  Echo 08/31/18 shows EF of 55-60%  Andrea Clarke is doing moderately well today.  We reviewed her labs, and the fact that she is not experiencing any peripheral neuropathy.  She will proceed with Taxol/Herceptin.  She has h/o slight LFT elevation previously.  Today is much better than days prior. Her Taxol has been dose reduced previously, and we will continue today with that reduction.    Andrea Clarke's pain is perplexing.  I am not sure what it is related to.  She was recommended to take one aleve with one extra strength tylenol BID (can go up to TID if needed).  She will do this.  She will also let me know if she has any further pain, as I may order plain films of the pelvis to evaluate.    The above was reviewed with Dr. Jana Hakim who is in agreement.  She will continue to return weekly for labs and treatment, and every other week to see myself or Dr. Lindi Adie.        All questions were answered. The patient knows to call the clinic with any problems, questions or concerns. We can certainly see the patient much sooner if necessary.  A total of (30) minutes of face-to-face time was spent with this patient with greater than 50% of that time in counseling and care-coordination.  This note was electronically signed. Scot Dock, NP 09/25/2018

## 2018-09-25 NOTE — Assessment & Plan Note (Addendum)
08/17/2018:Screening mammogram detected abnormality in the left breast by ultrasound irregular mass 10:00 position 1.3 cm, 10 o'clock position no enlarged lymph nodes, ER 0%, PR 0%, Ki-67 80%, HER-2 +3+ by IHC, grade 3, lymphovascular invasion present, T1c N0 stage Ia clinical stage  Treatment plan: 1.  Neo-adjuvant chemotherapy with Herceptin followed by Herceptin maintenance for 1 year 2. Breast conserving surgery with sentinel lymph node biopsy ----------------------------------------------------------------- Current treatment: Taxol Herceptin cycle 4  Echo 08/31/18 shows EF of 55-60%  Imaya is doing moderately well today.  We reviewed her labs, and the fact that she is not experiencing any peripheral neuropathy.  She will proceed with Taxol/Herceptin.  She has h/o slight LFT elevation previously.  Today is much better than days prior. Her Taxol has been dose reduced previously, and we will continue today with that reduction.    Gabriellia's pain is perplexing.  I am not sure what it is related to.  She was recommended to take one aleve with one extra strength tylenol BID (can go up to TID if needed).  She will do this.  She will also let me know if she has any further pain, as I may order plain films of the pelvis to evaluate.    The above was reviewed with Dr. Jana Hakim who is in agreement.  She will continue to return weekly for labs and treatment, and every other week to see myself or Dr. Lindi Adie.

## 2018-10-01 ENCOUNTER — Inpatient Hospital Stay: Payer: PRIVATE HEALTH INSURANCE

## 2018-10-01 DIAGNOSIS — C50212 Malignant neoplasm of upper-inner quadrant of left female breast: Secondary | ICD-10-CM

## 2018-10-01 DIAGNOSIS — Z5112 Encounter for antineoplastic immunotherapy: Secondary | ICD-10-CM | POA: Diagnosis not present

## 2018-10-01 DIAGNOSIS — Z171 Estrogen receptor negative status [ER-]: Principal | ICD-10-CM

## 2018-10-01 LAB — CMP (CANCER CENTER ONLY)
ALT: 118 U/L — ABNORMAL HIGH (ref 0–44)
AST: 48 U/L — ABNORMAL HIGH (ref 15–41)
Albumin: 3.8 g/dL (ref 3.5–5.0)
Alkaline Phosphatase: 77 U/L (ref 38–126)
Anion gap: 9 (ref 5–15)
BUN: 20 mg/dL (ref 8–23)
CO2: 25 mmol/L (ref 22–32)
Calcium: 9.1 mg/dL (ref 8.9–10.3)
Chloride: 104 mmol/L (ref 98–111)
Creatinine: 0.78 mg/dL (ref 0.44–1.00)
GFR, Estimated: >60 mL/min (ref >60)
Glucose, Bld: 195 mg/dL — ABNORMAL HIGH (ref 70–99)
Potassium: 4 mmol/L (ref 3.5–5.1)
Sodium: 138 mmol/L (ref 135–145)
Total Bilirubin: 0.8 mg/dL (ref 0.3–1.2)
Total Protein: 6.3 g/dL — ABNORMAL LOW (ref 6.5–8.1)

## 2018-10-01 LAB — CBC WITH DIFFERENTIAL (CANCER CENTER ONLY)
Abs Immature Granulocytes: 0.07 10*3/uL (ref 0.00–0.07)
Basophils Absolute: 0 10*3/uL (ref 0.0–0.1)
Basophils Relative: 0 %
EOS ABS: 0.1 10*3/uL (ref 0.0–0.5)
EOS PCT: 2 %
HCT: 38 % (ref 36.0–46.0)
Hemoglobin: 13.3 g/dL (ref 12.0–15.0)
Immature Granulocytes: 1 %
Lymphocytes Relative: 29 %
Lymphs Abs: 1.7 10*3/uL (ref 0.7–4.0)
MCH: 34.3 pg — ABNORMAL HIGH (ref 26.0–34.0)
MCHC: 35 g/dL (ref 30.0–36.0)
MCV: 97.9 fL (ref 80.0–100.0)
Monocytes Absolute: 0.3 10*3/uL (ref 0.1–1.0)
Monocytes Relative: 5 %
Neutro Abs: 3.7 10*3/uL (ref 1.7–7.7)
Neutrophils Relative %: 63 %
PLATELETS: 196 10*3/uL (ref 150–400)
RBC: 3.88 MIL/uL (ref 3.87–5.11)
RDW: 12.7 % (ref 11.5–15.5)
WBC Count: 5.9 10*3/uL (ref 4.0–10.5)
nRBC: 0 % (ref 0.0–0.2)

## 2018-10-01 NOTE — Patient Instructions (Signed)
Implanted Port Home Guide An implanted port is a type of central line that is placed under the skin. Central lines are used to provide IV access when treatment or nutrition needs to be given through a person's veins. Implanted ports are used for long-term IV access. An implanted port may be placed because:  You need IV medicine that would be irritating to the small veins in your hands or arms.  You need long-term IV medicines, such as antibiotics.  You need IV nutrition for a long period.  You need frequent blood draws for lab tests.  You need dialysis.  Implanted ports are usually placed in the chest area, but they can also be placed in the upper arm, the abdomen, or the leg. An implanted port has two main parts:  Reservoir. The reservoir is round and will appear as a small, raised area under your skin. The reservoir is the part where a needle is inserted to give medicines or draw blood.  Catheter. The catheter is a thin, flexible tube that extends from the reservoir. The catheter is placed into a large vein. Medicine that is inserted into the reservoir goes into the catheter and then into the vein.  How will I care for my incision site? Do not get the incision site wet. Bathe or shower as directed by your health care provider. How is my port accessed? Special steps must be taken to access the port:  Before the port is accessed, a numbing cream can be placed on the skin. This helps numb the skin over the port site.  Your health care provider uses a sterile technique to access the port. ? Your health care provider must put on a mask and sterile gloves. ? The skin over your port is cleaned carefully with an antiseptic and allowed to dry. ? The port is gently pinched between sterile gloves, and a needle is inserted into the port.  Only "non-coring" port needles should be used to access the port. Once the port is accessed, a blood return should be checked. This helps ensure that the port  is in the vein and is not clogged.  If your port needs to remain accessed for a constant infusion, a clear (transparent) bandage will be placed over the needle site. The bandage and needle will need to be changed every week, or as directed by your health care provider.  Keep the bandage covering the needle clean and dry. Do not get it wet. Follow your health care provider's instructions on how to take a shower or bath while the port is accessed.  If your port does not need to stay accessed, no bandage is needed over the port.  What is flushing? Flushing helps keep the port from getting clogged. Follow your health care provider's instructions on how and when to flush the port. Ports are usually flushed with saline solution or a medicine called heparin. The need for flushing will depend on how the port is used.  If the port is used for intermittent medicines or blood draws, the port will need to be flushed: ? After medicines have been given. ? After blood has been drawn. ? As part of routine maintenance.  If a constant infusion is running, the port may not need to be flushed.  How long will my port stay implanted? The port can stay in for as long as your health care provider thinks it is needed. When it is time for the port to come out, surgery will be   done to remove it. The procedure is similar to the one performed when the port was put in. When should I seek immediate medical care? When you have an implanted port, you should seek immediate medical care if:  You notice a bad smell coming from the incision site.  You have swelling, redness, or drainage at the incision site.  You have more swelling or pain at the port site or the surrounding area.  You have a fever that is not controlled with medicine.  This information is not intended to replace advice given to you by your health care provider. Make sure you discuss any questions you have with your health care provider. Document  Released: 10/03/2005 Document Revised: 03/10/2016 Document Reviewed: 06/10/2013 Elsevier Interactive Patient Education  2017 [employer]  

## 2018-10-02 ENCOUNTER — Inpatient Hospital Stay: Payer: PRIVATE HEALTH INSURANCE

## 2018-10-02 ENCOUNTER — Other Ambulatory Visit: Payer: Self-pay | Admitting: Hematology and Oncology

## 2018-10-02 VITALS — BP 98/68 | HR 63 | Temp 97.9°F | Resp 18

## 2018-10-02 DIAGNOSIS — C50212 Malignant neoplasm of upper-inner quadrant of left female breast: Secondary | ICD-10-CM

## 2018-10-02 DIAGNOSIS — Z5112 Encounter for antineoplastic immunotherapy: Secondary | ICD-10-CM | POA: Diagnosis not present

## 2018-10-02 DIAGNOSIS — Z171 Estrogen receptor negative status [ER-]: Principal | ICD-10-CM

## 2018-10-02 MED ORDER — DIPHENHYDRAMINE HCL 50 MG/ML IJ SOLN
INTRAMUSCULAR | Status: AC
  Filled 2018-10-02: qty 1

## 2018-10-02 MED ORDER — SODIUM CHLORIDE 0.9% FLUSH
10.0000 mL | INTRAVENOUS | Status: DC | PRN
  Administered 2018-10-02: 10 mL
  Filled 2018-10-02: qty 10

## 2018-10-02 MED ORDER — FAMOTIDINE IN NACL 20-0.9 MG/50ML-% IV SOLN
INTRAVENOUS | Status: AC
  Filled 2018-10-02: qty 50

## 2018-10-02 MED ORDER — HEPARIN SOD (PORK) LOCK FLUSH 100 UNIT/ML IV SOLN
500.0000 [IU] | Freq: Once | INTRAVENOUS | Status: AC | PRN
  Administered 2018-10-02: 500 [IU]
  Filled 2018-10-02: qty 5

## 2018-10-02 MED ORDER — ACETAMINOPHEN 325 MG PO TABS
ORAL_TABLET | ORAL | Status: AC
  Filled 2018-10-02: qty 2

## 2018-10-02 MED ORDER — SODIUM CHLORIDE 0.9 % IV SOLN
50.0000 mg/m2 | Freq: Once | INTRAVENOUS | Status: AC
  Administered 2018-10-02: 90 mg via INTRAVENOUS
  Filled 2018-10-02: qty 15

## 2018-10-02 MED ORDER — TRASTUZUMAB CHEMO 150 MG IV SOLR
150.0000 mg | Freq: Once | INTRAVENOUS | Status: AC
  Administered 2018-10-02: 150 mg via INTRAVENOUS
  Filled 2018-10-02: qty 7.14

## 2018-10-02 MED ORDER — SODIUM CHLORIDE 0.9 % IV SOLN
20.0000 mg | Freq: Once | INTRAVENOUS | Status: AC
  Administered 2018-10-02: 20 mg via INTRAVENOUS
  Filled 2018-10-02: qty 2

## 2018-10-02 MED ORDER — FAMOTIDINE IN NACL 20-0.9 MG/50ML-% IV SOLN
20.0000 mg | Freq: Once | INTRAVENOUS | Status: AC
  Administered 2018-10-02: 20 mg via INTRAVENOUS

## 2018-10-02 MED ORDER — DIPHENHYDRAMINE HCL 50 MG/ML IJ SOLN
50.0000 mg | Freq: Once | INTRAMUSCULAR | Status: AC
  Administered 2018-10-02: 50 mg via INTRAVENOUS

## 2018-10-02 MED ORDER — ACETAMINOPHEN 325 MG PO TABS
650.0000 mg | ORAL_TABLET | Freq: Once | ORAL | Status: AC
  Administered 2018-10-02: 650 mg via ORAL

## 2018-10-02 MED ORDER — SODIUM CHLORIDE 0.9 % IV SOLN
Freq: Once | INTRAVENOUS | Status: AC
  Administered 2018-10-02: 08:00:00 via INTRAVENOUS
  Filled 2018-10-02: qty 250

## 2018-10-02 NOTE — Patient Instructions (Signed)
[facility] Discharge Instructions for Patients Receiving Chemotherapy  Today you received the following chemotherapy agents Trastuzumab (HERCEPTIN) & Paclitaxel (TAXOL).  To help prevent nausea and vomiting after your treatment, we encourage you to take your nausea medication as prescribed.   If you develop nausea and vomiting that is not controlled by your nausea medication, call the clinic.   BELOW ARE SYMPTOMS THAT SHOULD BE REPORTED IMMEDIATELY:  *FEVER GREATER THAN 100.5 F  *CHILLS WITH OR WITHOUT FEVER  NAUSEA AND VOMITING THAT IS NOT CONTROLLED WITH YOUR NAUSEA MEDICATION  *UNUSUAL SHORTNESS OF BREATH  *UNUSUAL BRUISING OR BLEEDING  TENDERNESS IN MOUTH AND THROAT WITH OR WITHOUT PRESENCE OF ULCERS  *URINARY PROBLEMS  *BOWEL PROBLEMS  UNUSUAL RASH Items with * indicate a potential emergency and should be followed up as soon as possible.  Feel free to call the clinic should you have any questions or concerns. The clinic phone number is [phone].  Please show the [place] at check-in to the Emergency Department and triage nurse.

## 2018-10-03 ENCOUNTER — Telehealth: Payer: Self-pay | Admitting: Hematology and Oncology

## 2018-10-03 NOTE — Telephone Encounter (Signed)
Called regarding 12/31

## 2018-10-04 NOTE — Progress Notes (Signed)
Patient Care Team: [name], MD as PCP - General (Internal Medicine)  DIAGNOSIS:    ICD-10-CM   1. Malignant neoplasm of upper-inner quadrant of left breast in female, estrogen receptor negative ([place]) C50.212    Z17.1     SUMMARY OF ONCOLOGIC HISTORY:   Malignant neoplasm of upper-inner quadrant of left breast in female, estrogen receptor negative ([place])   08/17/2018 Initial Diagnosis    Screening mammogram detected abnormality in the left breast by ultrasound irregular mass 10:00 position 1.3 cm, 10 o'clock position no enlarged lymph nodes, ER 0%, PR 0%, Ki-67 80%, HER-2 +3+ by [facility]    08/28/2018 Cancer Staging    Staging form: Breast, AJCC 8th Edition - Clinical stage from 08/28/2018: Stage IA (cT1c, cN0, cM0, G3, ER-, PR-, HER2+) - Signed by Nicholas Lose, MD on 08/28/2018    08/28/2018 Breast MRI    malignancy within the UPPER INNER LEFT breast with dominant mass measuring 2.1 cm. With small adjacent satellite nodules, the entire area 1.5 x 3 x 2.2 cm.No evidence of abnormal lymph nodes or RIGHT breast malignancy.    09/03/2018 -  Chemotherapy    Weekly Taxol and Herceptin followed by maintenance Herceptin for one year.      CHIEF COMPLIANT: Cycle 6 of Taxol with Herceptin  INTERVAL HISTORY: Andrea Clarke is a 72 y.o. with above-mentioned history of left breast cancer. She is currently receiving neo-adjuvant chemotherapy with Taxol and Herceptin and presents to the clinic today with her daughter and husband for Cycle 6. She reports muscle aches in her legs that have improved since they were severe a few weeks ago. They are worse 2-4 days following treatment and are moderately improved with Tylenol and Aleve. She reports constipation after each treatment for 2-3 days. She recently visited her PCP who said she was diabetic based on recent blood sugar levels, which have been in flux due to treatment, and reported she will wait until after treatment to check sugar levels with  her PCP. She denies tingling of her feet and toes.   REVIEW OF SYSTEMS:   Constitutional: Denies fevers, chills or abnormal weight loss Eyes: Denies blurriness of vision Ears, nose, mouth, throat, and face: Denies mucositis or sore throat Respiratory: Denies cough, dyspnea or wheezes Cardiovascular: Denies palpitation, chest discomfort Gastrointestinal:  Denies nausea, heartburn (+) constipation Skin: Denies abnormal skin rashes MSK: (+) muscle aches in legs Lymphatics: Denies new lymphadenopathy or easy bruising Neurological:Denies numbness, tingling or new weaknesses Behavioral/Psych: Mood is stable, no new changes  Extremities: No lower extremity edema Breast: denies any pain or lumps or nodules in either breasts All other systems were reviewed with the patient and are negative.  I have reviewed the past medical history, past surgical history, social history and family history with the patient and they are unchanged from previous note.  ALLERGIES:  is allergic to adhesive [tape]; oxycodone hcl; augmentin [amoxicillin-pot clavulanate]; and xylocaine [lidocaine hcl].  MEDICATIONS:  Current Outpatient Medications  Medication Sig Dispense Refill  . acetaminophen (TYLENOL) 500 MG tablet Take 500-1,000 mg by mouth daily as needed for moderate pain or headache.    Marland Kitchen atorvastatin (LIPITOR) 20 MG tablet Take 20 mg by mouth daily.    . brimonidine-timolol (COMBIGAN) 0.2-0.5 % ophthalmic solution Place 1 drop into both eyes every 12 (twelve) hours.    Marland Kitchen CALCIUM PO Take 1 tablet by mouth at bedtime.    . cetirizine (ZYRTEC) 10 MG tablet Take 10 mg by mouth daily.    Marland Kitchen  Cholecalciferol (VITAMIN D3) 125 MCG (5000 UT) CAPS Take 5,000 Units by mouth every evening.    . Coenzyme Q10 (COQ-10 PO) Take 1 tablet by mouth every evening.    Marland Kitchen dexlansoprazole (DEXILANT) 60 MG capsule Take 60 mg by mouth daily.    Marland Kitchen diltiazem (CARDIZEM CD) 120 MG 24 hr capsule Take 1 capsule (120 mg total) by mouth daily.  90 capsule 3  . flecainide (TAMBOCOR) 100 MG tablet TAKE 1 TABLET BY MOUTH TWICE DAILY. NEW MEDICATION. (Patient taking differently: Take 100 mg by mouth 2 (two) times daily. ) 180 tablet 2  . fluticasone (FLONASE) 50 MCG/ACT nasal spray Place 1 spray into both nostrils daily as needed for allergies.     . furosemide (LASIX) 20 MG tablet Take 1 tablet (20 mg total) by mouth daily. 90 tablet 0  . KRILL OIL PO Take 1 capsule by mouth at bedtime.    . lidocaine-prilocaine (EMLA) cream Apply to affected area once 30 g 3  . LORazepam (ATIVAN) 0.5 MG tablet Take 1 tablet (0.5 mg total) by mouth at bedtime as needed (Nausea or vomiting). 30 tablet 0  . montelukast (SINGULAIR) 10 MG tablet Take 10 mg by mouth daily.    . Multiple Vitamin (MULTIVITAMIN) tablet Take 1 tablet by mouth every evening.     . ondansetron (ZOFRAN) 8 MG tablet Take 1 tablet (8 mg total) by mouth 2 (two) times daily as needed (Nausea or vomiting). 30 tablet 1  . Polyethyl Glycol-Propyl Glycol (SYSTANE OP) Place 1 drop into both eyes 2 (two) times daily as needed (dry eyes).    . prochlorperazine (COMPAZINE) 10 MG tablet TAKE 1 TABLET(10 MG) BY MOUTH EVERY 6 HOURS AS NEEDED FOR NAUSEA OR VOMITING (Patient taking differently: Take 10 mg by mouth every 6 (six) hours as needed for nausea or vomiting. ) 385 tablet 1  . quinapril (ACCUPRIL) 20 MG tablet TAKE 1 TABLET(20 MG) BY MOUTH EVERY MORNING 90 tablet 0  . traMADol (ULTRAM) 50 MG tablet Take 1 tablet (50 mg total) by mouth every 6 (six) hours as needed. 10 tablet 0   No current facility-administered medications for this visit.    Facility-Administered Medications Ordered in Other Visits  Medication Dose Route Frequency Provider Last Rate Last Dose  . sodium chloride flush (NS) 0.9 % injection 10 mL  10 mL Intracatheter Once Nicholas Lose, MD        PHYSICAL EXAMINATION: ECOG PERFORMANCE STATUS: 1 - Symptomatic but completely ambulatory  Vitals:   10/16/18 0844  BP: 111/66    Pulse: (!) 58  Resp: 18  Temp: 97.9 F (36.6 C)  SpO2: 97%   Filed Weights   10/16/18 0844  Weight: 161 lb 9.6 oz (73.3 kg)    GENERAL:alert, no distress and comfortable SKIN: skin color, texture, turgor are normal, no rashes or significant lesions EYES: normal, Conjunctiva are pink and non-injected, sclera clear OROPHARYNX:no exudate, no erythema and lips, buccal mucosa, and tongue normal  NECK: supple, thyroid normal size, non-tender, without nodularity LYMPH:  no palpable lymphadenopathy in the cervical, axillary or inguinal LUNGS: clear to auscultation and percussion with normal breathing effort HEART: regular rate & rhythm and no murmurs and no lower extremity edema ABDOMEN:abdomen soft, non-tender and normal bowel sounds MUSCULOSKELETAL:no cyanosis of digits and no clubbing  NEURO: alert & oriented x 3 with fluent speech, no focal motor/sensory deficits EXTREMITIES: No lower extremity edema  LABORATORY DATA:  I have reviewed the data as listed CMP Latest Ref  Rng & Units 10/16/2018 10/09/2018 10/01/2018  Glucose 70 - 99 mg/dL 119(H) 180(H) 195(H)  BUN 8 - 23 mg/dL '17 15 20  ' Creatinine 0.44 - 1.00 mg/dL 0.71 0.80 0.78  Sodium 135 - 145 mmol/L 139 138 138  Potassium 3.5 - 5.1 mmol/L 4.3 3.8 4.0  Chloride 98 - 111 mmol/L 105 105 104  CO2 22 - 32 mmol/L '24 24 25  ' Calcium 8.9 - 10.3 mg/dL 9.1 9.3 9.1  Total Protein 6.5 - 8.1 g/dL 6.1(L) 6.1(L) 6.3(L)  Total Bilirubin 0.3 - 1.2 mg/dL 0.8 0.7 0.8  Alkaline Phos 38 - 126 U/L 76 73 77  AST 15 - 41 U/L 30 48(H) 48(H)  ALT 0 - 44 U/L 75(H) 103(H) 118(H)    Lab Results  Component Value Date   WBC 6.0 10/16/2018   HGB 12.4 10/16/2018   HCT 36.0 10/16/2018   MCV 98.6 10/16/2018   PLT 183 10/16/2018   NEUTROABS 3.6 10/16/2018    ASSESSMENT & PLAN:  Malignant neoplasm of upper-inner quadrant of left breast in female, estrogen receptor negative ([place]) 08/17/2018:Screening mammogram detected abnormality in the left breast by  ultrasound irregular mass 10:00 position 1.3 cm, 10 o'clock position no enlarged lymph nodes, ER 0%, PR 0%, Ki-67 80%, HER-2 +3+ by IHC, grade 3, lymphovascular invasion present, T1c N0 stage Ia clinical stage  Treatment plan: 1.Neo-adjuvant chemotherapy with Herceptin followed by Herceptin maintenance for 1 year 2.Breast conserving surgery with sentinel lymph node biopsy ----------------------------------------------------------------- Current treatment: Taxol Herceptin cycle 7  Echo 08/31/18 shows EF of 55-60% Chemo toxicities: 1.  Elevated LFTs: Being monitored closely with reduced dosage of Taxol 2.   leg discomfort: Probably due to Taxol currently at 50 mg/m dose with Taxol.  We will continue the same dose the pain happens at 3 to 4 days after each chemo.  He does not seem to respond to tramadol.  She is taking Tylenol and Motrin. 3.  Elevated blood sugars due to steroids.  I will discuss with her primary care physician to hold off on additional diabetic work-up or work-up for LFTs because they are all treatment-related adverse effects.  Once she is done with chemotherapy then a better assessment can be made on both of these issues and work-up can be initiated if necessary.  I anticipate that the LFTs will come down along with the sugars after finishing chemo.  Monitoring closely for toxicities Return to clinic weekly for chemo and every other week for follow-up with me    No orders of the defined types were placed in this encounter.  The patient has a good understanding of the overall plan. she agrees with it. she will call with any problems that may develop before the next visit here.  Nicholas Lose, MD 10/16/2018   I, Cloyde Reams Dorshimer, am acting as scribe for Nicholas Lose, MD.  I have reviewed the above documentation for accuracy and completeness, and I agree with the above.

## 2018-10-09 ENCOUNTER — Inpatient Hospital Stay: Payer: PRIVATE HEALTH INSURANCE

## 2018-10-09 VITALS — BP 123/81 | HR 58 | Temp 97.9°F | Resp 18

## 2018-10-09 DIAGNOSIS — Z171 Estrogen receptor negative status [ER-]: Secondary | ICD-10-CM

## 2018-10-09 DIAGNOSIS — Z5112 Encounter for antineoplastic immunotherapy: Secondary | ICD-10-CM | POA: Diagnosis not present

## 2018-10-09 DIAGNOSIS — C50212 Malignant neoplasm of upper-inner quadrant of left female breast: Secondary | ICD-10-CM

## 2018-10-09 DIAGNOSIS — Z95828 Presence of other vascular implants and grafts: Secondary | ICD-10-CM

## 2018-10-09 LAB — CBC WITH DIFFERENTIAL (CANCER CENTER ONLY)
Abs Immature Granulocytes: 0.09 10*3/uL — ABNORMAL HIGH (ref 0.00–0.07)
BASOS ABS: 0 10*3/uL (ref 0.0–0.1)
Basophils Relative: 0 %
Eosinophils Absolute: 0.1 10*3/uL (ref 0.0–0.5)
Eosinophils Relative: 2 %
HCT: 36.3 % (ref 36.0–46.0)
Hemoglobin: 12.7 g/dL (ref 12.0–15.0)
Immature Granulocytes: 2 %
Lymphocytes Relative: 26 %
Lymphs Abs: 1.5 10*3/uL (ref 0.7–4.0)
MCH: 34.4 pg — ABNORMAL HIGH (ref 26.0–34.0)
MCHC: 35 g/dL (ref 30.0–36.0)
MCV: 98.4 fL (ref 80.0–100.0)
Monocytes Absolute: 0.4 10*3/uL (ref 0.1–1.0)
Monocytes Relative: 7 %
NRBC: 0 % (ref 0.0–0.2)
Neutro Abs: 3.6 10*3/uL (ref 1.7–7.7)
Neutrophils Relative %: 63 %
PLATELETS: 179 10*3/uL (ref 150–400)
RBC: 3.69 MIL/uL — ABNORMAL LOW (ref 3.87–5.11)
RDW: 13.1 % (ref 11.5–15.5)
WBC: 5.7 10*3/uL (ref 4.0–10.5)

## 2018-10-09 LAB — CMP (CANCER CENTER ONLY)
ALT: 103 U/L — ABNORMAL HIGH (ref 0–44)
AST: 48 U/L — ABNORMAL HIGH (ref 15–41)
Albumin: 3.6 g/dL (ref 3.5–5.0)
Alkaline Phosphatase: 73 U/L (ref 38–126)
Anion gap: 9 (ref 5–15)
BILIRUBIN TOTAL: 0.7 mg/dL (ref 0.3–1.2)
BUN: 15 mg/dL (ref 8–23)
CO2: 24 mmol/L (ref 22–32)
Calcium: 9.3 mg/dL (ref 8.9–10.3)
Chloride: 105 mmol/L (ref 98–111)
Creatinine: 0.8 mg/dL (ref 0.44–1.00)
GFR, Est AFR Am: >60 mL/min (ref >60)
Glucose, Bld: 180 mg/dL — ABNORMAL HIGH (ref 70–99)
Potassium: 3.8 mmol/L (ref 3.5–5.1)
Sodium: 138 mmol/L (ref 135–145)
TOTAL PROTEIN: 6.1 g/dL — AB (ref 6.5–8.1)

## 2018-10-09 MED ORDER — TRASTUZUMAB CHEMO 150 MG IV SOLR
150.0000 mg | Freq: Once | INTRAVENOUS | Status: AC
  Administered 2018-10-09: 150 mg via INTRAVENOUS
  Filled 2018-10-09: qty 7.14

## 2018-10-09 MED ORDER — SODIUM CHLORIDE 0.9 % IV SOLN
20.0000 mg | Freq: Once | INTRAVENOUS | Status: AC
  Administered 2018-10-09: 20 mg via INTRAVENOUS
  Filled 2018-10-09: qty 2

## 2018-10-09 MED ORDER — SODIUM CHLORIDE 0.9 % IV SOLN
Freq: Once | INTRAVENOUS | Status: AC
  Administered 2018-10-09: 10:00:00 via INTRAVENOUS
  Filled 2018-10-09: qty 250

## 2018-10-09 MED ORDER — FAMOTIDINE IN NACL 20-0.9 MG/50ML-% IV SOLN
20.0000 mg | Freq: Once | INTRAVENOUS | Status: AC
  Administered 2018-10-09: 20 mg via INTRAVENOUS

## 2018-10-09 MED ORDER — FAMOTIDINE IN NACL 20-0.9 MG/50ML-% IV SOLN
INTRAVENOUS | Status: AC
  Filled 2018-10-09: qty 50

## 2018-10-09 MED ORDER — DIPHENHYDRAMINE HCL 50 MG/ML IJ SOLN
INTRAMUSCULAR | Status: AC
  Filled 2018-10-09: qty 1

## 2018-10-09 MED ORDER — DIPHENHYDRAMINE HCL 50 MG/ML IJ SOLN
50.0000 mg | Freq: Once | INTRAMUSCULAR | Status: AC
  Administered 2018-10-09: 50 mg via INTRAVENOUS

## 2018-10-09 MED ORDER — HEPARIN SOD (PORK) LOCK FLUSH 100 UNIT/ML IV SOLN
500.0000 [IU] | Freq: Once | INTRAVENOUS | Status: AC | PRN
  Administered 2018-10-09: 500 [IU]
  Filled 2018-10-09: qty 5

## 2018-10-09 MED ORDER — SODIUM CHLORIDE 0.9% FLUSH
10.0000 mL | INTRAVENOUS | Status: DC | PRN
  Administered 2018-10-09: 10 mL
  Filled 2018-10-09: qty 10

## 2018-10-09 MED ORDER — ACETAMINOPHEN 325 MG PO TABS
ORAL_TABLET | ORAL | Status: AC
  Filled 2018-10-09: qty 2

## 2018-10-09 MED ORDER — ACETAMINOPHEN 325 MG PO TABS
650.0000 mg | ORAL_TABLET | Freq: Once | ORAL | Status: AC
  Administered 2018-10-09: 650 mg via ORAL

## 2018-10-09 MED ORDER — SODIUM CHLORIDE 0.9% FLUSH
10.0000 mL | Freq: Once | INTRAVENOUS | Status: AC
  Administered 2018-10-09: 10 mL
  Filled 2018-10-09: qty 10

## 2018-10-09 MED ORDER — SODIUM CHLORIDE 0.9 % IV SOLN
50.0000 mg/m2 | Freq: Once | INTRAVENOUS | Status: AC
  Administered 2018-10-09: 90 mg via INTRAVENOUS
  Filled 2018-10-09: qty 15

## 2018-10-09 NOTE — Progress Notes (Signed)
Okay to continue Herceptin/Taxol treatment today with ALT of 103 per Dr. Jana Hakim.

## 2018-10-09 NOTE — Patient Instructions (Signed)
Andrea Clarke Discharge Instructions for Patients Receiving Chemotherapy  Today you received the following chemotherapy agents trastuzumab (Herceptin) & paclitaxel (Taxol).  To help prevent nausea and vomiting after your treatment, we encourage you to take your nausea medication as prescribed.   If you develop nausea and vomiting that is not controlled by your nausea medication, call the clinic.   BELOW ARE SYMPTOMS THAT SHOULD BE REPORTED IMMEDIATELY:  *FEVER GREATER THAN 100.5 F  *CHILLS WITH OR WITHOUT FEVER  NAUSEA AND VOMITING THAT IS NOT CONTROLLED WITH YOUR NAUSEA MEDICATION  *UNUSUAL SHORTNESS OF BREATH  *UNUSUAL BRUISING OR BLEEDING  TENDERNESS IN MOUTH AND THROAT WITH OR WITHOUT PRESENCE OF ULCERS  *URINARY PROBLEMS  *BOWEL PROBLEMS  UNUSUAL RASH Items with * indicate a potential emergency and should be followed up as soon as possible.  Feel free to call the clinic should you have any questions or concerns. The clinic phone number is [phone].  Please show the [place] at check-in to the Emergency Department and triage nurse.

## 2018-10-16 ENCOUNTER — Inpatient Hospital Stay (HOSPITAL_BASED_OUTPATIENT_CLINIC_OR_DEPARTMENT_OTHER): Payer: PRIVATE HEALTH INSURANCE | Admitting: Hematology and Oncology

## 2018-10-16 ENCOUNTER — Inpatient Hospital Stay: Payer: PRIVATE HEALTH INSURANCE

## 2018-10-16 ENCOUNTER — Encounter: Payer: Self-pay | Admitting: *Deleted

## 2018-10-16 DIAGNOSIS — Z171 Estrogen receptor negative status [ER-]: Secondary | ICD-10-CM | POA: Diagnosis not present

## 2018-10-16 DIAGNOSIS — C50212 Malignant neoplasm of upper-inner quadrant of left female breast: Secondary | ICD-10-CM

## 2018-10-16 DIAGNOSIS — T380X5A Adverse effect of glucocorticoids and synthetic analogues, initial encounter: Secondary | ICD-10-CM

## 2018-10-16 DIAGNOSIS — R7989 Other specified abnormal findings of blood chemistry: Secondary | ICD-10-CM | POA: Diagnosis not present

## 2018-10-16 DIAGNOSIS — Z95828 Presence of other vascular implants and grafts: Secondary | ICD-10-CM

## 2018-10-16 DIAGNOSIS — R739 Hyperglycemia, unspecified: Secondary | ICD-10-CM

## 2018-10-16 DIAGNOSIS — Z5112 Encounter for antineoplastic immunotherapy: Secondary | ICD-10-CM | POA: Diagnosis not present

## 2018-10-16 LAB — CMP (CANCER CENTER ONLY)
ALK PHOS: 76 U/L (ref 38–126)
ALT: 75 U/L — ABNORMAL HIGH (ref 0–44)
AST: 30 U/L (ref 15–41)
Albumin: 3.6 g/dL (ref 3.5–5.0)
Anion gap: 10 (ref 5–15)
BUN: 17 mg/dL (ref 8–23)
CO2: 24 mmol/L (ref 22–32)
Calcium: 9.1 mg/dL (ref 8.9–10.3)
Chloride: 105 mmol/L (ref 98–111)
Creatinine: 0.71 mg/dL (ref 0.44–1.00)
GFR, Est AFR Am: >60 mL/min (ref >60)
GFR, Estimated: >60 mL/min (ref >60)
Glucose, Bld: 119 mg/dL — ABNORMAL HIGH (ref 70–99)
Potassium: 4.3 mmol/L (ref 3.5–5.1)
Sodium: 139 mmol/L (ref 135–145)
Total Bilirubin: 0.8 mg/dL (ref 0.3–1.2)
Total Protein: 6.1 g/dL — ABNORMAL LOW (ref 6.5–8.1)

## 2018-10-16 LAB — CBC WITH DIFFERENTIAL (CANCER CENTER ONLY)
Abs Immature Granulocytes: 0.08 10*3/uL — ABNORMAL HIGH (ref 0.00–0.07)
Basophils Absolute: 0 10*3/uL (ref 0.0–0.1)
Basophils Relative: 0 %
EOS ABS: 0.2 10*3/uL (ref 0.0–0.5)
EOS PCT: 3 %
HCT: 36 % (ref 36.0–46.0)
Hemoglobin: 12.4 g/dL (ref 12.0–15.0)
Immature Granulocytes: 1 %
Lymphocytes Relative: 30 %
Lymphs Abs: 1.8 10*3/uL (ref 0.7–4.0)
MCH: 34 pg (ref 26.0–34.0)
MCHC: 34.4 g/dL (ref 30.0–36.0)
MCV: 98.6 fL (ref 80.0–100.0)
Monocytes Absolute: 0.4 10*3/uL (ref 0.1–1.0)
Monocytes Relative: 7 %
Neutro Abs: 3.6 10*3/uL (ref 1.7–7.7)
Neutrophils Relative %: 59 %
PLATELETS: 183 10*3/uL (ref 150–400)
RBC: 3.65 MIL/uL — ABNORMAL LOW (ref 3.87–5.11)
RDW: 13.2 % (ref 11.5–15.5)
WBC Count: 6 10*3/uL (ref 4.0–10.5)
nRBC: 0 % (ref 0.0–0.2)

## 2018-10-16 MED ORDER — ACETAMINOPHEN 325 MG PO TABS
650.0000 mg | ORAL_TABLET | Freq: Once | ORAL | Status: AC
  Administered 2018-10-16: 650 mg via ORAL

## 2018-10-16 MED ORDER — FAMOTIDINE IN NACL 20-0.9 MG/50ML-% IV SOLN
20.0000 mg | Freq: Once | INTRAVENOUS | Status: AC
  Administered 2018-10-16: 20 mg via INTRAVENOUS

## 2018-10-16 MED ORDER — HEPARIN SOD (PORK) LOCK FLUSH 100 UNIT/ML IV SOLN
500.0000 [IU] | Freq: Once | INTRAVENOUS | Status: AC | PRN
  Administered 2018-10-16: 500 [IU]
  Filled 2018-10-16: qty 5

## 2018-10-16 MED ORDER — FAMOTIDINE IN NACL 20-0.9 MG/50ML-% IV SOLN
INTRAVENOUS | Status: AC
  Filled 2018-10-16: qty 50

## 2018-10-16 MED ORDER — SODIUM CHLORIDE 0.9% FLUSH
10.0000 mL | Freq: Once | INTRAVENOUS | Status: AC
  Administered 2018-10-16: 10 mL
  Filled 2018-10-16: qty 10

## 2018-10-16 MED ORDER — ACETAMINOPHEN 325 MG PO TABS
ORAL_TABLET | ORAL | Status: AC
  Filled 2018-10-16: qty 2

## 2018-10-16 MED ORDER — SODIUM CHLORIDE 0.9 % IV SOLN
50.0000 mg/m2 | Freq: Once | INTRAVENOUS | Status: AC
  Administered 2018-10-16: 90 mg via INTRAVENOUS
  Filled 2018-10-16: qty 15

## 2018-10-16 MED ORDER — SODIUM CHLORIDE 0.9 % IV SOLN
Freq: Once | INTRAVENOUS | Status: AC
  Administered 2018-10-16: 12:00:00 via INTRAVENOUS
  Filled 2018-10-16: qty 250

## 2018-10-16 MED ORDER — DIPHENHYDRAMINE HCL 50 MG/ML IJ SOLN
INTRAMUSCULAR | Status: AC
  Filled 2018-10-16: qty 1

## 2018-10-16 MED ORDER — TRASTUZUMAB CHEMO 150 MG IV SOLR
150.0000 mg | Freq: Once | INTRAVENOUS | Status: AC
  Administered 2018-10-16: 150 mg via INTRAVENOUS
  Filled 2018-10-16: qty 7.14

## 2018-10-16 MED ORDER — SODIUM CHLORIDE 0.9% FLUSH
10.0000 mL | INTRAVENOUS | Status: DC | PRN
  Administered 2018-10-16: 10 mL
  Filled 2018-10-16: qty 10

## 2018-10-16 MED ORDER — SODIUM CHLORIDE 0.9 % IV SOLN
20.0000 mg | Freq: Once | INTRAVENOUS | Status: AC
  Administered 2018-10-16: 20 mg via INTRAVENOUS
  Filled 2018-10-16: qty 2

## 2018-10-16 MED ORDER — DIPHENHYDRAMINE HCL 50 MG/ML IJ SOLN
50.0000 mg | Freq: Once | INTRAMUSCULAR | Status: AC
  Administered 2018-10-16: 50 mg via INTRAVENOUS

## 2018-10-16 NOTE — Assessment & Plan Note (Addendum)
08/17/2018:Screening mammogram detected abnormality in the left breast by ultrasound irregular mass 10:00 position 1.3 cm, 10 o'clock position no enlarged lymph nodes, ER 0%, PR 0%, Ki-67 80%, HER-2 +3+ by IHC, grade 3, lymphovascular invasion present, T1c N0 stage Ia clinical stage  Treatment plan: 1.Neo-adjuvant chemotherapy with Herceptin followed by Herceptin maintenance for 1 year 2.Breast conserving surgery with sentinel lymph node biopsy ----------------------------------------------------------------- Current treatment: Taxol Herceptin cycle 7  Echo 08/31/18 shows EF of 55-60% Chemo toxicities: 1.  Elevated LFTs: Being monitored closely with reduced dosage of Taxol 2.   leg discomfort: Probably due to Taxol currently at 50 mg/m dose with Taxol.  We will continue the same dose the pain happens at 3 to 4 days after each chemo.  He does not seem to respond to tramadol.  She is taking Tylenol and Motrin.  Monitoring closely for toxicities Return to clinic weekly for chemo and every other week for follow-up with me

## 2018-10-16 NOTE — Patient Instructions (Signed)
[facility] Discharge Instructions for Patients Receiving Chemotherapy  Today you received the following chemotherapy agents:  Herceptin and Taxol.  To help prevent nausea and vomiting after your treatment, we encourage you to take your nausea medication as directed.   If you develop nausea and vomiting that is not controlled by your nausea medication, call the clinic.   BELOW ARE SYMPTOMS THAT SHOULD BE REPORTED IMMEDIATELY:  *FEVER GREATER THAN 100.5 F  *CHILLS WITH OR WITHOUT FEVER  NAUSEA AND VOMITING THAT IS NOT CONTROLLED WITH YOUR NAUSEA MEDICATION  *UNUSUAL SHORTNESS OF BREATH  *UNUSUAL BRUISING OR BLEEDING  TENDERNESS IN MOUTH AND THROAT WITH OR WITHOUT PRESENCE OF ULCERS  *URINARY PROBLEMS  *BOWEL PROBLEMS  UNUSUAL RASH Items with * indicate a potential emergency and should be followed up as soon as possible.  Feel free to call the clinic should you have any questions or concerns. The clinic phone number is [phone].  Please show the CHEMO ALERT CARD at check-in to the Emergency Department and triage nurse.   

## 2018-10-16 NOTE — Progress Notes (Signed)
After administering IV diphenhydramine, patient reports having visual hallucinations after receiving IV diphenhydramine in previous treatments. Discussed this with Henreitta Leber, RPh who will discuss with Dr. Lindi Adie. Patient aware.

## 2018-10-17 ENCOUNTER — Encounter: Payer: Self-pay | Admitting: Hematology and Oncology

## 2018-10-20 ENCOUNTER — Encounter: Payer: Self-pay | Admitting: Hematology and Oncology

## 2018-10-23 ENCOUNTER — Inpatient Hospital Stay: Payer: PRIVATE HEALTH INSURANCE

## 2018-10-23 ENCOUNTER — Inpatient Hospital Stay: Payer: PRIVATE HEALTH INSURANCE | Attending: Hematology and Oncology

## 2018-10-23 VITALS — BP 147/78 | HR 56 | Temp 97.8°F | Resp 18 | Ht 62.5 in | Wt 162.0 lb

## 2018-10-23 DIAGNOSIS — Z5112 Encounter for antineoplastic immunotherapy: Secondary | ICD-10-CM | POA: Diagnosis present

## 2018-10-23 DIAGNOSIS — R7989 Other specified abnormal findings of blood chemistry: Secondary | ICD-10-CM | POA: Insufficient documentation

## 2018-10-23 DIAGNOSIS — Z171 Estrogen receptor negative status [ER-]: Secondary | ICD-10-CM

## 2018-10-23 DIAGNOSIS — C50212 Malignant neoplasm of upper-inner quadrant of left female breast: Secondary | ICD-10-CM | POA: Diagnosis present

## 2018-10-23 DIAGNOSIS — Z87891 Personal history of nicotine dependence: Secondary | ICD-10-CM | POA: Insufficient documentation

## 2018-10-23 DIAGNOSIS — R5383 Other fatigue: Secondary | ICD-10-CM | POA: Insufficient documentation

## 2018-10-23 DIAGNOSIS — Z5111 Encounter for antineoplastic chemotherapy: Secondary | ICD-10-CM | POA: Diagnosis not present

## 2018-10-23 DIAGNOSIS — Z95828 Presence of other vascular implants and grafts: Secondary | ICD-10-CM

## 2018-10-23 LAB — CMP (CANCER CENTER ONLY)
ALBUMIN: 3.7 g/dL (ref 3.5–5.0)
ALT: 88 U/L — AB (ref 0–44)
AST: 39 U/L (ref 15–41)
Alkaline Phosphatase: 78 U/L (ref 38–126)
Anion gap: 9 (ref 5–15)
BUN: 13 mg/dL (ref 8–23)
CO2: 25 mmol/L (ref 22–32)
Calcium: 9.3 mg/dL (ref 8.9–10.3)
Chloride: 105 mmol/L (ref 98–111)
Creatinine: 0.73 mg/dL (ref 0.44–1.00)
GFR, Est AFR Am: >60 mL/min (ref >60)
Glucose, Bld: 121 mg/dL — ABNORMAL HIGH (ref 70–99)
Potassium: 4.3 mmol/L (ref 3.5–5.1)
Sodium: 139 mmol/L (ref 135–145)
Total Bilirubin: 0.7 mg/dL (ref 0.3–1.2)
Total Protein: 6.1 g/dL — ABNORMAL LOW (ref 6.5–8.1)

## 2018-10-23 LAB — CBC WITH DIFFERENTIAL (CANCER CENTER ONLY)
Abs Immature Granulocytes: 0.11 10*3/uL — ABNORMAL HIGH (ref 0.00–0.07)
Basophils Absolute: 0 10*3/uL (ref 0.0–0.1)
Basophils Relative: 1 %
Eosinophils Absolute: 0.1 10*3/uL (ref 0.0–0.5)
Eosinophils Relative: 2 %
HCT: 36.4 % (ref 36.0–46.0)
Hemoglobin: 12.5 g/dL (ref 12.0–15.0)
Immature Granulocytes: 2 %
Lymphocytes Relative: 28 %
Lymphs Abs: 1.7 10*3/uL (ref 0.7–4.0)
MCH: 34.4 pg — ABNORMAL HIGH (ref 26.0–34.0)
MCHC: 34.3 g/dL (ref 30.0–36.0)
MCV: 100.3 fL — ABNORMAL HIGH (ref 80.0–100.0)
Monocytes Absolute: 0.4 10*3/uL (ref 0.1–1.0)
Monocytes Relative: 7 %
Neutro Abs: 3.6 10*3/uL (ref 1.7–7.7)
Neutrophils Relative %: 60 %
Platelet Count: 180 10*3/uL (ref 150–400)
RBC: 3.63 MIL/uL — AB (ref 3.87–5.11)
RDW: 13.6 % (ref 11.5–15.5)
WBC Count: 5.9 10*3/uL (ref 4.0–10.5)
nRBC: 0 % (ref 0.0–0.2)

## 2018-10-23 MED ORDER — SODIUM CHLORIDE 0.9 % IV SOLN
20.0000 mg | Freq: Once | INTRAVENOUS | Status: AC
  Administered 2018-10-23: 20 mg via INTRAVENOUS
  Filled 2018-10-23: qty 2

## 2018-10-23 MED ORDER — FAMOTIDINE IN NACL 20-0.9 MG/50ML-% IV SOLN
20.0000 mg | Freq: Once | INTRAVENOUS | Status: AC
  Administered 2018-10-23: 20 mg via INTRAVENOUS

## 2018-10-23 MED ORDER — SODIUM CHLORIDE 0.9% FLUSH
10.0000 mL | INTRAVENOUS | Status: DC | PRN
  Administered 2018-10-23: 10 mL
  Filled 2018-10-23: qty 10

## 2018-10-23 MED ORDER — DIPHENHYDRAMINE HCL 25 MG PO CAPS
25.0000 mg | ORAL_CAPSULE | Freq: Once | ORAL | Status: AC
  Administered 2018-10-23: 25 mg via ORAL

## 2018-10-23 MED ORDER — SODIUM CHLORIDE 0.9% FLUSH
10.0000 mL | Freq: Once | INTRAVENOUS | Status: AC
  Administered 2018-10-23: 10 mL
  Filled 2018-10-23: qty 10

## 2018-10-23 MED ORDER — ACETAMINOPHEN 325 MG PO TABS
ORAL_TABLET | ORAL | Status: AC
  Filled 2018-10-23: qty 2

## 2018-10-23 MED ORDER — SODIUM CHLORIDE 0.9 % IV SOLN
Freq: Once | INTRAVENOUS | Status: AC
  Administered 2018-10-23: 12:00:00 via INTRAVENOUS
  Filled 2018-10-23: qty 250

## 2018-10-23 MED ORDER — DIPHENHYDRAMINE HCL 25 MG PO CAPS
ORAL_CAPSULE | ORAL | Status: AC
  Filled 2018-10-23: qty 1

## 2018-10-23 MED ORDER — SODIUM CHLORIDE 0.9 % IV SOLN
50.0000 mg/m2 | Freq: Once | INTRAVENOUS | Status: AC
  Administered 2018-10-23: 90 mg via INTRAVENOUS
  Filled 2018-10-23: qty 15

## 2018-10-23 MED ORDER — FAMOTIDINE IN NACL 20-0.9 MG/50ML-% IV SOLN
INTRAVENOUS | Status: AC
  Filled 2018-10-23: qty 50

## 2018-10-23 MED ORDER — HEPARIN SOD (PORK) LOCK FLUSH 100 UNIT/ML IV SOLN
500.0000 [IU] | Freq: Once | INTRAVENOUS | Status: AC | PRN
  Administered 2018-10-23: 500 [IU]
  Filled 2018-10-23: qty 5

## 2018-10-23 MED ORDER — TRASTUZUMAB CHEMO 150 MG IV SOLR
150.0000 mg | Freq: Once | INTRAVENOUS | Status: AC
  Administered 2018-10-23: 150 mg via INTRAVENOUS
  Filled 2018-10-23: qty 7.14

## 2018-10-23 MED ORDER — ACETAMINOPHEN 325 MG PO TABS
650.0000 mg | ORAL_TABLET | Freq: Once | ORAL | Status: AC
  Administered 2018-10-23: 650 mg via ORAL

## 2018-10-23 NOTE — Progress Notes (Signed)
Per Dr. Lindi Adie :OK to treat with ALT of 88

## 2018-10-23 NOTE — Patient Instructions (Signed)
[place] Discharge Instructions for Patients Receiving Chemotherapy  Today you received the following chemotherapy agents: Trastuzumab (Herceptin) and Paclitaxel (Taxol)  To help prevent nausea and vomiting after your treatment, we encourage you to take your nausea medication as directed.    If you develop nausea and vomiting that is not controlled by your nausea medication, call the clinic.   BELOW ARE SYMPTOMS THAT SHOULD BE REPORTED IMMEDIATELY:  *FEVER GREATER THAN 100.5 F  *CHILLS WITH OR WITHOUT FEVER  NAUSEA AND VOMITING THAT IS NOT CONTROLLED WITH YOUR NAUSEA MEDICATION  *UNUSUAL SHORTNESS OF BREATH  *UNUSUAL BRUISING OR BLEEDING  TENDERNESS IN MOUTH AND THROAT WITH OR WITHOUT PRESENCE OF ULCERS  *URINARY PROBLEMS  *BOWEL PROBLEMS  UNUSUAL RASH Items with * indicate a potential emergency and should be followed up as soon as possible.  Feel free to call the clinic should you have any questions or concerns. The clinic phone number is [phone].  Please show the [place] at check-in to the Emergency Department and triage nurse.

## 2018-10-30 ENCOUNTER — Other Ambulatory Visit: Payer: PRIVATE HEALTH INSURANCE

## 2018-10-30 ENCOUNTER — Ambulatory Visit: Payer: PRIVATE HEALTH INSURANCE | Admitting: Hematology and Oncology

## 2018-10-31 ENCOUNTER — Other Ambulatory Visit: Payer: PRIVATE HEALTH INSURANCE

## 2018-10-31 ENCOUNTER — Inpatient Hospital Stay: Payer: PRIVATE HEALTH INSURANCE

## 2018-10-31 ENCOUNTER — Inpatient Hospital Stay (HOSPITAL_BASED_OUTPATIENT_CLINIC_OR_DEPARTMENT_OTHER): Payer: PRIVATE HEALTH INSURANCE | Admitting: Adult Health

## 2018-10-31 ENCOUNTER — Encounter: Payer: Self-pay | Admitting: *Deleted

## 2018-10-31 ENCOUNTER — Encounter: Payer: Self-pay | Admitting: Adult Health

## 2018-10-31 VITALS — BP 129/68 | HR 51 | Temp 97.9°F | Resp 18 | Ht 62.5 in | Wt 163.1 lb

## 2018-10-31 DIAGNOSIS — Z171 Estrogen receptor negative status [ER-]: Secondary | ICD-10-CM | POA: Diagnosis not present

## 2018-10-31 DIAGNOSIS — R7989 Other specified abnormal findings of blood chemistry: Secondary | ICD-10-CM

## 2018-10-31 DIAGNOSIS — Z87891 Personal history of nicotine dependence: Secondary | ICD-10-CM

## 2018-10-31 DIAGNOSIS — Z5112 Encounter for antineoplastic immunotherapy: Secondary | ICD-10-CM | POA: Diagnosis not present

## 2018-10-31 DIAGNOSIS — C50212 Malignant neoplasm of upper-inner quadrant of left female breast: Secondary | ICD-10-CM

## 2018-10-31 DIAGNOSIS — I10 Essential (primary) hypertension: Secondary | ICD-10-CM

## 2018-10-31 DIAGNOSIS — Z95828 Presence of other vascular implants and grafts: Secondary | ICD-10-CM

## 2018-10-31 LAB — CMP (CANCER CENTER ONLY)
ALT: 79 U/L — ABNORMAL HIGH (ref 0–44)
AST: 32 U/L (ref 15–41)
Albumin: 3.7 g/dL (ref 3.5–5.0)
Alkaline Phosphatase: 81 U/L (ref 38–126)
Anion gap: 9 (ref 5–15)
BUN: 15 mg/dL (ref 8–23)
CO2: 24 mmol/L (ref 22–32)
Calcium: 9 mg/dL (ref 8.9–10.3)
Chloride: 106 mmol/L (ref 98–111)
Creatinine: 0.7 mg/dL (ref 0.44–1.00)
GFR, Est AFR Am: >60 mL/min (ref >60)
GFR, Estimated: >60 mL/min (ref >60)
Glucose, Bld: 148 mg/dL — ABNORMAL HIGH (ref 70–99)
Potassium: 4.1 mmol/L (ref 3.5–5.1)
Sodium: 139 mmol/L (ref 135–145)
TOTAL PROTEIN: 6.1 g/dL — AB (ref 6.5–8.1)
Total Bilirubin: 0.7 mg/dL (ref 0.3–1.2)

## 2018-10-31 LAB — CBC WITH DIFFERENTIAL (CANCER CENTER ONLY)
Abs Immature Granulocytes: 0.12 10*3/uL — ABNORMAL HIGH (ref 0.00–0.07)
BASOS ABS: 0 10*3/uL (ref 0.0–0.1)
Basophils Relative: 0 %
Eosinophils Absolute: 0.1 10*3/uL (ref 0.0–0.5)
Eosinophils Relative: 2 %
HEMATOCRIT: 35 % — AB (ref 36.0–46.0)
Hemoglobin: 12.1 g/dL (ref 12.0–15.0)
Immature Granulocytes: 2 %
LYMPHS ABS: 1.6 10*3/uL (ref 0.7–4.0)
Lymphocytes Relative: 28 %
MCH: 34.9 pg — ABNORMAL HIGH (ref 26.0–34.0)
MCHC: 34.6 g/dL (ref 30.0–36.0)
MCV: 100.9 fL — ABNORMAL HIGH (ref 80.0–100.0)
Monocytes Absolute: 0.5 10*3/uL (ref 0.1–1.0)
Monocytes Relative: 8 %
NRBC: 0.5 % — AB (ref 0.0–0.2)
Neutro Abs: 3.4 10*3/uL (ref 1.7–7.7)
Neutrophils Relative %: 60 %
Platelet Count: 179 10*3/uL (ref 150–400)
RBC: 3.47 MIL/uL — ABNORMAL LOW (ref 3.87–5.11)
RDW: 14.3 % (ref 11.5–15.5)
WBC Count: 5.6 10*3/uL (ref 4.0–10.5)

## 2018-10-31 MED ORDER — SODIUM CHLORIDE 0.9 % IV SOLN
Freq: Once | INTRAVENOUS | Status: AC
  Administered 2018-10-31: 11:00:00 via INTRAVENOUS
  Filled 2018-10-31: qty 250

## 2018-10-31 MED ORDER — SODIUM CHLORIDE 0.9 % IV SOLN
50.0000 mg/m2 | Freq: Once | INTRAVENOUS | Status: AC
  Administered 2018-10-31: 90 mg via INTRAVENOUS
  Filled 2018-10-31: qty 15

## 2018-10-31 MED ORDER — HEPARIN SOD (PORK) LOCK FLUSH 100 UNIT/ML IV SOLN
500.0000 [IU] | Freq: Once | INTRAVENOUS | Status: AC | PRN
  Administered 2018-10-31: 500 [IU]
  Filled 2018-10-31: qty 5

## 2018-10-31 MED ORDER — FAMOTIDINE IN NACL 20-0.9 MG/50ML-% IV SOLN
20.0000 mg | Freq: Once | INTRAVENOUS | Status: AC
  Administered 2018-10-31: 20 mg via INTRAVENOUS

## 2018-10-31 MED ORDER — TRASTUZUMAB CHEMO 150 MG IV SOLR
150.0000 mg | Freq: Once | INTRAVENOUS | Status: AC
  Administered 2018-10-31: 150 mg via INTRAVENOUS
  Filled 2018-10-31: qty 7.14

## 2018-10-31 MED ORDER — SODIUM CHLORIDE 0.9% FLUSH
10.0000 mL | Freq: Once | INTRAVENOUS | Status: AC
  Administered 2018-10-31: 10 mL
  Filled 2018-10-31: qty 10

## 2018-10-31 MED ORDER — DIPHENHYDRAMINE HCL 25 MG PO CAPS
ORAL_CAPSULE | ORAL | Status: AC
  Filled 2018-10-31: qty 1

## 2018-10-31 MED ORDER — FAMOTIDINE IN NACL 20-0.9 MG/50ML-% IV SOLN
INTRAVENOUS | Status: AC
  Filled 2018-10-31: qty 50

## 2018-10-31 MED ORDER — ACETAMINOPHEN 325 MG PO TABS
650.0000 mg | ORAL_TABLET | Freq: Once | ORAL | Status: AC
  Administered 2018-10-31: 650 mg via ORAL

## 2018-10-31 MED ORDER — ACETAMINOPHEN 325 MG PO TABS
ORAL_TABLET | ORAL | Status: AC
  Filled 2018-10-31: qty 2

## 2018-10-31 MED ORDER — SODIUM CHLORIDE 0.9% FLUSH
10.0000 mL | INTRAVENOUS | Status: DC | PRN
  Administered 2018-10-31: 10 mL
  Filled 2018-10-31: qty 10

## 2018-10-31 MED ORDER — SODIUM CHLORIDE 0.9 % IV SOLN
20.0000 mg | Freq: Once | INTRAVENOUS | Status: AC
  Administered 2018-10-31: 20 mg via INTRAVENOUS
  Filled 2018-10-31: qty 2

## 2018-10-31 MED ORDER — DIPHENHYDRAMINE HCL 25 MG PO CAPS
25.0000 mg | ORAL_CAPSULE | Freq: Once | ORAL | Status: AC
  Administered 2018-10-31: 25 mg via ORAL

## 2018-10-31 NOTE — Patient Instructions (Signed)
[place] Discharge Instructions for Patients Receiving Chemotherapy  Today you received the following chemotherapy agents: Trastuzumab (Herceptin) and Paclitaxel (Taxol)  To help prevent nausea and vomiting after your treatment, we encourage you to take your nausea medication as directed.    If you develop nausea and vomiting that is not controlled by your nausea medication, call the clinic.   BELOW ARE SYMPTOMS THAT SHOULD BE REPORTED IMMEDIATELY:  *FEVER GREATER THAN 100.5 F  *CHILLS WITH OR WITHOUT FEVER  NAUSEA AND VOMITING THAT IS NOT CONTROLLED WITH YOUR NAUSEA MEDICATION  *UNUSUAL SHORTNESS OF BREATH  *UNUSUAL BRUISING OR BLEEDING  TENDERNESS IN MOUTH AND THROAT WITH OR WITHOUT PRESENCE OF ULCERS  *URINARY PROBLEMS  *BOWEL PROBLEMS  UNUSUAL RASH Items with * indicate a potential emergency and should be followed up as soon as possible.  Feel free to call the clinic should you have any questions or concerns. The clinic phone number is [phone].  Please show the [place] at check-in to the Emergency Department and triage nurse.

## 2018-10-31 NOTE — Assessment & Plan Note (Addendum)
08/17/2018:Screening mammogram detected abnormality in the left breast by ultrasound irregular mass 10:00 position 1.3 cm, 10 o'clock position no enlarged lymph nodes, ER 0%, PR 0%, Ki-67 80%, HER-2 +3+ by IHC, grade 3, lymphovascular invasion present, T1c N0 stage Ia clinical stage  Treatment plan: 1.Neo-adjuvant chemotherapy with Herceptin followed by Herceptin maintenance for 1 year 2.Breast conserving surgery with sentinel lymph node biopsy ----------------------------------------------------------------- Current treatment: Taxol Herceptin cycle 9  Echo 08/31/18 shows EF of 55-60% Chemo toxicities: 1.  Elevated LFTs: Being monitored closely with reduced dosage of Taxol 2.   leg discomfort: stable today 3. Fatigue: recommended exercise.   No peripheral neuropathy.  Ordered bilateral breast MRI today and reviewed with her navigator [place].  Let patient know that after she has MRI she will review results with Dr. Donne Hazel to discuss surgical planning.  I reviewed timing of MRI, surgery visit, surgery, radiation and continuing her maintenance Herceptin.    Return to clinic weekly for chemo and every other week for follow-up with Dr. Lindi Adie

## 2018-10-31 NOTE — Progress Notes (Signed)
[facility] Follow up:    [name], MD [address] [place] [address]   DIAGNOSIS: Cancer Staging Malignant neoplasm of upper-inner quadrant of left breast in female, estrogen receptor negative ([place]) Staging form: Breast, AJCC 8th Edition - Clinical stage from 08/28/2018: Stage IA (cT1c, cN0, cM0, G3, ER-, PR-, HER2+) - Signed by Nicholas Lose, MD on 08/28/2018   SUMMARY OF ONCOLOGIC HISTORY:   Malignant neoplasm of upper-inner quadrant of left breast in female, estrogen receptor negative ([place])   08/17/2018 Initial Diagnosis    Screening mammogram detected abnormality in the left breast by ultrasound irregular mass 10:00 position 1.3 cm, 10 o'clock position no enlarged lymph nodes, ER 0%, PR 0%, Ki-67 80%, HER-2 +3+ by [facility]    08/28/2018 Cancer Staging    Staging form: Breast, AJCC 8th Edition - Clinical stage from 08/28/2018: Stage IA (cT1c, cN0, cM0, G3, ER-, PR-, HER2+) - Signed by Nicholas Lose, MD on 08/28/2018    08/28/2018 Breast MRI    malignancy within the UPPER INNER LEFT breast with dominant mass measuring 2.1 cm. With small adjacent satellite nodules, the entire area 1.5 x 3 x 2.2 cm.No evidence of abnormal lymph nodes or RIGHT breast malignancy.    09/03/2018 -  Chemotherapy    Weekly Taxol and Herceptin followed by maintenance Herceptin for one year.      CURRENT THERAPY: Taxol/Herceptin  INTERVAL HISTORY: Andrea Clarke 73 y.o. female returns for evaluation prior to receiving her ninth dose of Taxol and Herceptin.  She is tolerating treatment well.  She does have a couple of days of fatigue every week, but otherwise no issues.  She has only been nauseated twice during her entire chemotherapy regimen.  She has noted some constipation, but tells me she has it figured out at this point.  She denies any peripheral neuropathy at this point.  She does have some questions surrounding the scheduling of her upcoming  appointments/surgery, as she is nearing the end of her neoadjuvant treatments.     Patient Active Problem List   Diagnosis Date Noted  . Port-A-Cath in place 08/31/2018  . Malignant neoplasm of upper-inner quadrant of left breast in female, estrogen receptor negative ([place]) 08/28/2018  . Hypertension   . Hyperlipidemia   . Glaucoma   . GERD (gastroesophageal reflux disease)   . HYPERLIPIDEMIA 06/11/2007  . GLAUCOMA NOS 06/11/2007  . HTN (hypertension) 06/11/2007  . ALLERGIC RHINITIS 06/11/2007  . GERD 06/11/2007  . HERPES ZOSTER, UNCOMPLICATED 77/41/4239    is allergic to adhesive [tape]; oxycodone hcl; augmentin [amoxicillin-pot clavulanate]; and xylocaine [lidocaine hcl].  MEDICAL HISTORY: Past Medical History:  Diagnosis Date  . Atrial tachycardia ([place])   . Breast cancer ([place])    left breast cancer 2019  . 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 Allegra Lai)  . GERD (gastroesophageal reflux disease)   . Glaucoma   . Hyperlipidemia   . Hypertension   . PONV (postoperative nausea and vomiting)     SURGICAL HISTORY: Past Surgical History:  Procedure Laterality Date  . ABDOMINAL HYSTERECTOMY    . ANKLE SURGERY    . CATARACT EXTRACTION W/ INTRAOCULAR LENS IMPLANT Bilateral   . PORTACATH PLACEMENT Right 09/03/2018   Procedure: INSERTION PORT-A-CATH WITH [place];  Surgeon: Rolm Bookbinder, MD;  Location: [facility];  Service: General;  Laterality: Right;  . ROTATOR CUFF REPAIR Right     SOCIAL HISTORY: Social History  Socioeconomic History  . Marital status: Married    Spouse name: Not on file  . Number of children: Not on file  . Years of education: Not on file  . Highest education level: Not on file  Occupational History  . Not on file  Social Needs  . Financial resource strain: Not on file  . Food insecurity:    Worry: Not on file    Inability: Not on file  . Transportation needs:    Medical: Not on file    Non-medical: Not on  file  Tobacco Use  . Smoking status: Former Research scientist (life sciences)  . Smokeless tobacco: Never Used  Substance and Sexual Activity  . Alcohol use: Yes    Comment: glass of wine a day  . Drug use: No  . Sexual activity: Not on file  Lifestyle  . Physical activity:    Days per week: Not on file    Minutes per session: Not on file  . Stress: Not on file  Relationships  . Social connections:    Talks on phone: Not on file    Gets together: Not on file    Attends religious service: Not on file    Active member of club or organization: Not on file    Attends meetings of clubs or organizations: Not on file    Relationship status: Not on file  . Intimate partner violence:    Fear of current or ex partner: Not on file    Emotionally abused: Not on file    Physically abused: Not on file    Forced sexual activity: Not on file  Other Topics Concern  . Not on file  Social History Narrative  . Not on file    FAMILY HISTORY: Family History  Problem Relation Age of Onset  . Hypertension Mother   . Breast cancer Neg Hx     Review of Systems  Constitutional: Positive for fatigue. Negative for appetite change, chills and unexpected weight change.  HENT:   Negative for hearing loss, lump/mass, mouth sores and trouble swallowing.   Eyes: Negative for eye problems and icterus.  Respiratory: Negative for chest tightness, cough and shortness of breath.   Cardiovascular: Negative for chest pain, leg swelling and palpitations.  Gastrointestinal: Negative for abdominal distention, abdominal pain, constipation, diarrhea, nausea and vomiting.  Endocrine: Negative for hot flashes.  Genitourinary: Negative for difficulty urinating.   Musculoskeletal: Negative for arthralgias.  Skin: Negative for itching and rash.  Neurological: Negative for dizziness, extremity weakness and numbness.  Hematological: Negative for adenopathy. Does not bruise/bleed easily.  Psychiatric/Behavioral: Negative for depression. The  patient is not nervous/anxious.       PHYSICAL EXAMINATION  ECOG PERFORMANCE STATUS: 1 - Symptomatic but completely ambulatory  Vitals:   10/31/18 0935  BP: 129/68  Pulse: (!) 51  Resp: 18  Temp: 97.9 F (36.6 C)  SpO2: 98%    Physical Exam Constitutional:      Appearance: Normal appearance.  HENT:     Head: Normocephalic and atraumatic.     Mouth/Throat:     Mouth: Mucous membranes are moist.     Pharynx: Oropharynx is clear. No oropharyngeal exudate.  Eyes:     General: No scleral icterus.    Pupils: Pupils are equal, round, and reactive to light.  Neck:     Musculoskeletal: Neck supple.  Cardiovascular:     Rate and Rhythm: Normal rate and regular rhythm.     Pulses: Normal pulses.  Heart sounds: Normal heart sounds.  Pulmonary:     Effort: Pulmonary effort is normal.     Breath sounds: Normal breath sounds.     Comments: Unable to palpate breast mass.   Abdominal:     General: Abdomen is flat. There is no distension.     Palpations: Abdomen is soft.     Tenderness: There is no abdominal tenderness.  Musculoskeletal:        General: No swelling.  Skin:    General: Skin is warm and dry.     Capillary Refill: Capillary refill takes less than 2 seconds.     Findings: No rash.  Neurological:     General: No focal deficit present.     Mental Status: She is alert.  Psychiatric:        Mood and Affect: Mood normal.     LABORATORY DATA:  CBC    Component Value Date/Time   WBC 5.6 10/31/2018 0842   WBC 5.7 09/03/2018 1237   RBC 3.47 (L) 10/31/2018 0842   HGB 12.1 10/31/2018 0842   HCT 35.0 (L) 10/31/2018 0842   PLT 179 10/31/2018 0842   MCV 100.9 (H) 10/31/2018 0842   MCH 34.9 (H) 10/31/2018 0842   MCHC 34.6 10/31/2018 0842   RDW 14.3 10/31/2018 0842   LYMPHSABS 1.6 10/31/2018 0842   MONOABS 0.5 10/31/2018 0842   EOSABS 0.1 10/31/2018 0842   BASOSABS 0.0 10/31/2018 0842    CMP     Component Value Date/Time   NA 139 10/31/2018 0842   K 4.1  10/31/2018 0842   CL 106 10/31/2018 0842   CO2 24 10/31/2018 0842   GLUCOSE 148 (H) 10/31/2018 0842   BUN 15 10/31/2018 0842   CREATININE 0.70 10/31/2018 0842   CALCIUM 9.0 10/31/2018 0842   PROT 6.1 (L) 10/31/2018 0842   ALBUMIN 3.7 10/31/2018 0842   AST 32 10/31/2018 0842   ALT 79 (H) 10/31/2018 0842   ALKPHOS 81 10/31/2018 0842   BILITOT 0.7 10/31/2018 0842   GFRNONAA >60 10/31/2018 0842   GFRAA >60 10/31/2018 0842       ASSESSMENT and THERAPY PLAN:   Malignant neoplasm of upper-inner quadrant of left breast in female, estrogen receptor negative ([place]) 08/17/2018:Screening mammogram detected abnormality in the left breast by ultrasound irregular mass 10:00 position 1.3 cm, 10 o'clock position no enlarged lymph nodes, ER 0%, PR 0%, Ki-67 80%, HER-2 +3+ by IHC, grade 3, lymphovascular invasion present, T1c N0 stage Ia clinical stage  Treatment plan: 1.Neo-adjuvant chemotherapy with Herceptin followed by Herceptin maintenance for 1 year 2.Breast conserving surgery with sentinel lymph node biopsy ----------------------------------------------------------------- Current treatment: Taxol Herceptin cycle 9  Echo 08/31/18 shows EF of 55-60% Chemo toxicities: 1.  Elevated LFTs: Being monitored closely with reduced dosage of Taxol 2.   leg discomfort: stable today 3. Fatigue: recommended exercise.   No peripheral neuropathy.  Ordered bilateral breast MRI today and reviewed with her navigator [place].  Let patient know that after she has MRI she will review results with Dr. Donne Hazel to discuss surgical planning.  I reviewed timing of MRI, surgery visit, surgery, radiation and continuing her maintenance Herceptin.    Return to clinic weekly for chemo and every other week for follow-up with Dr. Lindi Adie   Orders Placed This Encounter  Procedures  . MR BREAST BILATERAL W WO CONTRAST INC CAD    Standing Status:   Future    Standing Expiration Date:   12/30/2019    Order Specific  Question:   If indicated for the ordered procedure, I authorize the administration of contrast media per Radiology protocol    Answer:   Yes    Order Specific Question:   What is the patient's sedation requirement?    Answer:   No Sedation    Order Specific Question:   Does the patient have a pacemaker or implanted devices?    Answer:   No    Order Specific Question:   Radiology Contrast Protocol - do NOT remove file path    Answer:   [email or web address]    Order Specific Question:   Preferred imaging location?    Answer:   [facility] (table limit-350 lbs)    All questions were answered. The patient knows to call the clinic with any problems, questions or concerns. We can certainly see the patient much sooner if necessary.  A total of (30) minutes of face-to-face time was spent with this patient with greater than 50% of that time in counseling and care-coordination.  This note was electronically signed. Scot Dock, NP 10/31/2018

## 2018-11-01 ENCOUNTER — Telehealth: Payer: Self-pay | Admitting: Adult Health

## 2018-11-01 NOTE — Telephone Encounter (Signed)
Per 1/15 no los °

## 2018-11-07 ENCOUNTER — Inpatient Hospital Stay: Payer: PRIVATE HEALTH INSURANCE

## 2018-11-07 VITALS — BP 137/70 | HR 58 | Temp 97.6°F | Resp 18

## 2018-11-07 DIAGNOSIS — Z171 Estrogen receptor negative status [ER-]: Secondary | ICD-10-CM

## 2018-11-07 DIAGNOSIS — Z5112 Encounter for antineoplastic immunotherapy: Secondary | ICD-10-CM | POA: Diagnosis not present

## 2018-11-07 DIAGNOSIS — Z95828 Presence of other vascular implants and grafts: Secondary | ICD-10-CM

## 2018-11-07 DIAGNOSIS — C50212 Malignant neoplasm of upper-inner quadrant of left female breast: Secondary | ICD-10-CM

## 2018-11-07 LAB — CBC WITH DIFFERENTIAL (CANCER CENTER ONLY)
Abs Immature Granulocytes: 0.13 10*3/uL — ABNORMAL HIGH (ref 0.00–0.07)
Basophils Absolute: 0.1 10*3/uL (ref 0.0–0.1)
Basophils Relative: 1 %
EOS ABS: 0.1 10*3/uL (ref 0.0–0.5)
Eosinophils Relative: 2 %
HCT: 35.7 % — ABNORMAL LOW (ref 36.0–46.0)
Hemoglobin: 12.6 g/dL (ref 12.0–15.0)
Immature Granulocytes: 2 %
LYMPHS ABS: 2 10*3/uL (ref 0.7–4.0)
Lymphocytes Relative: 29 %
MCH: 35.3 pg — ABNORMAL HIGH (ref 26.0–34.0)
MCHC: 35.3 g/dL (ref 30.0–36.0)
MCV: 100 fL (ref 80.0–100.0)
Monocytes Absolute: 0.4 10*3/uL (ref 0.1–1.0)
Monocytes Relative: 6 %
NRBC: 0 % (ref 0.0–0.2)
Neutro Abs: 4.2 10*3/uL (ref 1.7–7.7)
Neutrophils Relative %: 60 %
Platelet Count: 185 10*3/uL (ref 150–400)
RBC: 3.57 MIL/uL — ABNORMAL LOW (ref 3.87–5.11)
RDW: 13.8 % (ref 11.5–15.5)
WBC Count: 6.9 10*3/uL (ref 4.0–10.5)

## 2018-11-07 LAB — CMP (CANCER CENTER ONLY)
ALT: 88 U/L — ABNORMAL HIGH (ref 0–44)
AST: 34 U/L (ref 15–41)
Albumin: 3.8 g/dL (ref 3.5–5.0)
Alkaline Phosphatase: 85 U/L (ref 38–126)
Anion gap: 10 (ref 5–15)
BUN: 16 mg/dL (ref 8–23)
CO2: 25 mmol/L (ref 22–32)
Calcium: 9.1 mg/dL (ref 8.9–10.3)
Chloride: 104 mmol/L (ref 98–111)
Creatinine: 0.71 mg/dL (ref 0.44–1.00)
GFR, Estimated: >60 mL/min (ref >60)
Glucose, Bld: 121 mg/dL — ABNORMAL HIGH (ref 70–99)
Potassium: 4.3 mmol/L (ref 3.5–5.1)
Sodium: 139 mmol/L (ref 135–145)
Total Bilirubin: 0.9 mg/dL (ref 0.3–1.2)
Total Protein: 6.2 g/dL — ABNORMAL LOW (ref 6.5–8.1)

## 2018-11-07 MED ORDER — ACETAMINOPHEN 325 MG PO TABS
ORAL_TABLET | ORAL | Status: AC
  Filled 2018-11-07: qty 2

## 2018-11-07 MED ORDER — SODIUM CHLORIDE 0.9% FLUSH
10.0000 mL | Freq: Once | INTRAVENOUS | Status: AC
  Administered 2018-11-07: 10 mL
  Filled 2018-11-07: qty 10

## 2018-11-07 MED ORDER — DIPHENHYDRAMINE HCL 25 MG PO CAPS
ORAL_CAPSULE | ORAL | Status: AC
  Filled 2018-11-07: qty 1

## 2018-11-07 MED ORDER — TRASTUZUMAB CHEMO 150 MG IV SOLR
150.0000 mg | Freq: Once | INTRAVENOUS | Status: AC
  Administered 2018-11-07: 150 mg via INTRAVENOUS
  Filled 2018-11-07: qty 7.14

## 2018-11-07 MED ORDER — SODIUM CHLORIDE 0.9% FLUSH
10.0000 mL | INTRAVENOUS | Status: DC | PRN
  Administered 2018-11-07: 10 mL
  Filled 2018-11-07: qty 10

## 2018-11-07 MED ORDER — DIPHENHYDRAMINE HCL 25 MG PO CAPS
25.0000 mg | ORAL_CAPSULE | Freq: Once | ORAL | Status: AC
  Administered 2018-11-07: 25 mg via ORAL

## 2018-11-07 MED ORDER — FAMOTIDINE IN NACL 20-0.9 MG/50ML-% IV SOLN
20.0000 mg | Freq: Once | INTRAVENOUS | Status: AC
  Administered 2018-11-07: 20 mg via INTRAVENOUS

## 2018-11-07 MED ORDER — ACETAMINOPHEN 325 MG PO TABS
650.0000 mg | ORAL_TABLET | Freq: Once | ORAL | Status: AC
  Administered 2018-11-07: 650 mg via ORAL

## 2018-11-07 MED ORDER — SODIUM CHLORIDE 0.9 % IV SOLN
50.0000 mg/m2 | Freq: Once | INTRAVENOUS | Status: AC
  Administered 2018-11-07: 90 mg via INTRAVENOUS
  Filled 2018-11-07: qty 15

## 2018-11-07 MED ORDER — HEPARIN SOD (PORK) LOCK FLUSH 100 UNIT/ML IV SOLN
500.0000 [IU] | Freq: Once | INTRAVENOUS | Status: AC | PRN
  Administered 2018-11-07: 500 [IU]
  Filled 2018-11-07: qty 5

## 2018-11-07 MED ORDER — SODIUM CHLORIDE 0.9 % IV SOLN
Freq: Once | INTRAVENOUS | Status: AC
  Administered 2018-11-07: 11:00:00 via INTRAVENOUS
  Filled 2018-11-07: qty 250

## 2018-11-07 MED ORDER — SODIUM CHLORIDE 0.9 % IV SOLN
20.0000 mg | Freq: Once | INTRAVENOUS | Status: AC
  Administered 2018-11-07: 20 mg via INTRAVENOUS
  Filled 2018-11-07: qty 2

## 2018-11-07 MED ORDER — FAMOTIDINE IN NACL 20-0.9 MG/50ML-% IV SOLN
INTRAVENOUS | Status: AC
  Filled 2018-11-07: qty 50

## 2018-11-07 NOTE — Patient Instructions (Signed)
[facility] Discharge Instructions for Patients Receiving Chemotherapy  Today you received the following chemotherapy agents: Trastuzumab (Herceptin) and Paclitaxel (Taxol)  To help prevent nausea and vomiting after your treatment, we encourage you to take your nausea medication as directed.    If you develop nausea and vomiting that is not controlled by your nausea medication, call the clinic.   BELOW ARE SYMPTOMS THAT SHOULD BE REPORTED IMMEDIATELY:  *FEVER GREATER THAN 100.5 F  *CHILLS WITH OR WITHOUT FEVER  NAUSEA AND VOMITING THAT IS NOT CONTROLLED WITH YOUR NAUSEA MEDICATION  *UNUSUAL SHORTNESS OF BREATH  *UNUSUAL BRUISING OR BLEEDING  TENDERNESS IN MOUTH AND THROAT WITH OR WITHOUT PRESENCE OF ULCERS  *URINARY PROBLEMS  *BOWEL PROBLEMS  UNUSUAL RASH Items with * indicate a potential emergency and should be followed up as soon as possible.  Feel free to call the clinic should you have any questions or concerns. The clinic phone number is [phone].  Please show the [facility] at check-in to the Emergency Department and triage nurse.

## 2018-11-07 NOTE — Progress Notes (Signed)
Per Dr. Lindi Adie labs reviewed ok to treat today.

## 2018-11-07 NOTE — Progress Notes (Signed)
Patient Care Team: [name], MD as PCP - General (Internal Medicine)  DIAGNOSIS:    ICD-10-CM   1. Malignant neoplasm of upper-inner quadrant of left breast in female, estrogen receptor negative ([place]) C50.212 PHYSICIAN COMMUNICATION ORDER   Z17.1     SUMMARY OF ONCOLOGIC HISTORY:   Malignant neoplasm of upper-inner quadrant of left breast in female, estrogen receptor negative ([place])   08/17/2018 Initial Diagnosis    Screening mammogram detected abnormality in the left breast by ultrasound irregular mass 10:00 position 1.3 cm, 10 o'clock position no enlarged lymph nodes, ER 0%, PR 0%, Ki-67 80%, HER-2 +3+ by [facility]    08/28/2018 Cancer Staging    Staging form: Breast, AJCC 8th Edition - Clinical stage from 08/28/2018: Stage IA (cT1c, cN0, cM0, G3, ER-, PR-, HER2+) - Signed by Nicholas Lose, MD on 08/28/2018    08/28/2018 Breast MRI    malignancy within the UPPER INNER LEFT breast with dominant mass measuring 2.1 cm. With small adjacent satellite nodules, the entire area 1.5 x 3 x 2.2 cm.No evidence of abnormal lymph nodes or RIGHT breast malignancy.    09/03/2018 -  Chemotherapy    Weekly Taxol and Herceptin followed by maintenance Herceptin for one year.     12/11/2018 -  Chemotherapy    The patient had trastuzumab (HERCEPTIN) 588 mg in sodium chloride 0.9 % 250 mL chemo infusion, 8 mg/kg = 588 mg, Intravenous,  Once, 0 of 13 cycles  for chemotherapy treatment.      CHIEF COMPLIANT: Cycle 11 of Taxol with Herceptin   INTERVAL HISTORY: Andrea Clarke is a 73 y.o. with above-mentioned history of left breast cancer. She is currently receiving neo-adjuvant chemotherapy with Taxol and Herceptin and is here for Cycle 11. She presents to the clinic today and is tolerating treatment well. She reports severe fatigue on the 3rd and 4th days following treatment. She denies neuropathy in her hands and feet and reports icing her fingers during each treatment. She has not been drinking  alcohol or soda and has been wearing a mask in public during treatment. She questioned when she could start drinking an occasional glass of wine and soda again. She will see Dr. Donne Hazel on 11/23/18, following a 11/22/18 breast MRI, to discuss surgery.   REVIEW OF SYSTEMS:   Constitutional: Denies fevers, chills or abnormal weight loss (+) severe fatigue Eyes: Denies blurriness of vision Ears, nose, mouth, throat, and face: Denies mucositis or sore throat Respiratory: Denies cough, dyspnea or wheezes Cardiovascular: Denies palpitation, chest discomfort Gastrointestinal: Denies nausea, heartburn or change in bowel habits Skin: Denies abnormal skin rashes Lymphatics: Denies new lymphadenopathy or easy bruising Neurological: Denies numbness, tingling or new weaknesses Behavioral/Psych: Mood is stable, no new changes  Extremities: No lower extremity edema Breast: denies any pain or lumps or nodules in either breasts All other systems were reviewed with the patient and are negative.  I have reviewed the past medical history, past surgical history, social history and family history with the patient and they are unchanged from previous note.  ALLERGIES:  is allergic to adhesive [tape]; oxycodone hcl; augmentin [amoxicillin-pot clavulanate]; and xylocaine [lidocaine hcl].  MEDICATIONS:  Current Outpatient Medications  Medication Sig Dispense Refill  . acetaminophen (TYLENOL) 500 MG tablet Take 500-1,000 mg by mouth daily as needed for moderate pain or headache.    Marland Kitchen atorvastatin (LIPITOR) 20 MG tablet Take 20 mg by mouth daily.    . brimonidine-timolol (COMBIGAN) 0.2-0.5 % ophthalmic solution Place 1 drop  into both eyes every 12 (twelve) hours.    Marland Kitchen CALCIUM PO Take 1 tablet by mouth at bedtime.    Marland Kitchen CARTIA XT 120 MG 24 hr capsule TAKE ONE CAPSULE BY MOUTH DAILY, STOPPING METOPROLOL 90 capsule 3  . cetirizine (ZYRTEC) 10 MG tablet Take 10 mg by mouth daily.    . Cholecalciferol (VITAMIN D3) 125 MCG  (5000 UT) CAPS Take 5,000 Units by mouth every evening.    . Coenzyme Q10 (COQ-10 PO) Take 1 tablet by mouth every evening.    Marland Kitchen dexlansoprazole (DEXILANT) 60 MG capsule Take 60 mg by mouth daily.    . flecainide (TAMBOCOR) 100 MG tablet TAKE 1 TABLET BY MOUTH TWICE DAILY. NEW MEDICATION. (Patient taking differently: Take 100 mg by mouth 2 (two) times daily. ) 180 tablet 2  . fluticasone (FLONASE) 50 MCG/ACT nasal spray Place 1 spray into both nostrils daily as needed for allergies.     . furosemide (LASIX) 20 MG tablet Take 1 tablet (20 mg total) by mouth daily. 90 tablet 0  . KRILL OIL PO Take 1 capsule by mouth at bedtime.    . lidocaine-prilocaine (EMLA) cream Apply to affected area once 30 g 3  . LORazepam (ATIVAN) 0.5 MG tablet Take 1 tablet (0.5 mg total) by mouth at bedtime as needed (Nausea or vomiting). 30 tablet 0  . montelukast (SINGULAIR) 10 MG tablet Take 10 mg by mouth daily.    . Multiple Vitamin (MULTIVITAMIN) tablet Take 1 tablet by mouth every evening.     . ondansetron (ZOFRAN) 8 MG tablet Take 1 tablet (8 mg total) by mouth 2 (two) times daily as needed (Nausea or vomiting). 30 tablet 1  . Polyethyl Glycol-Propyl Glycol (SYSTANE OP) Place 1 drop into both eyes 2 (two) times daily as needed (dry eyes).    . prochlorperazine (COMPAZINE) 10 MG tablet TAKE 1 TABLET(10 MG) BY MOUTH EVERY 6 HOURS AS NEEDED FOR NAUSEA OR VOMITING (Patient taking differently: Take 10 mg by mouth every 6 (six) hours as needed for nausea or vomiting. ) 385 tablet 1  . quinapril (ACCUPRIL) 20 MG tablet TAKE 1 TABLET(20 MG) BY MOUTH EVERY MORNING 90 tablet 0  . traMADol (ULTRAM) 50 MG tablet Take 1 tablet (50 mg total) by mouth every 6 (six) hours as needed. 10 tablet 0   No current facility-administered medications for this visit.    Facility-Administered Medications Ordered in Other Visits  Medication Dose Route Frequency Provider Last Rate Last Dose  . sodium chloride flush (NS) 0.9 % injection 10 mL   10 mL Intracatheter Once Nicholas Lose, MD        PHYSICAL EXAMINATION: ECOG PERFORMANCE STATUS: 1 - Symptomatic but completely ambulatory  Vitals:   11/13/18 0823  BP: 115/70  Pulse: (!) 52  Resp: 17  Temp: 97.7 F (36.5 C)  SpO2: 98%   Filed Weights   11/13/18 0823  Weight: 164 lb 3.2 oz (74.5 kg)    GENERAL: alert, no distress and comfortable SKIN: skin color, texture, turgor are normal, no rashes or significant lesions EYES: normal, Conjunctiva are pink and non-injected, sclera clear OROPHARYNX: no exudate, no erythema and lips, buccal mucosa, and tongue normal  NECK: supple, thyroid normal size, non-tender, without nodularity LYMPH: no palpable lymphadenopathy in the cervical, axillary or inguinal LUNGS: clear to auscultation and percussion with normal breathing effort HEART: regular rate & rhythm and no murmurs and no lower extremity edema ABDOMEN: abdomen soft, non-tender and normal bowel sounds MUSCULOSKELETAL: no  cyanosis of digits and no clubbing  NEURO: alert & oriented x 3 with fluent speech, no focal motor/sensory deficits EXTREMITIES: No lower extremity edema  LABORATORY DATA:  I have reviewed the data as listed CMP Latest Ref Rng & Units 11/07/2018 10/31/2018 10/23/2018  Glucose 70 - 99 mg/dL 121(H) 148(H) 121(H)  BUN 8 - 23 mg/dL '16 15 13  ' Creatinine 0.44 - 1.00 mg/dL 0.71 0.70 0.73  Sodium 135 - 145 mmol/L 139 139 139  Potassium 3.5 - 5.1 mmol/L 4.3 4.1 4.3  Chloride 98 - 111 mmol/L 104 106 105  CO2 22 - 32 mmol/L '25 24 25  ' Calcium 8.9 - 10.3 mg/dL 9.1 9.0 9.3  Total Protein 6.5 - 8.1 g/dL 6.2(L) 6.1(L) 6.1(L)  Total Bilirubin 0.3 - 1.2 mg/dL 0.9 0.7 0.7  Alkaline Phos 38 - 126 U/L 85 81 78  AST 15 - 41 U/L 34 32 39  ALT 0 - 44 U/L 88(H) 79(H) 88(H)    Lab Results  Component Value Date   WBC 6.1 11/13/2018   HGB 12.0 11/13/2018   HCT 34.5 (L) 11/13/2018   MCV 100.6 (H) 11/13/2018   PLT 170 11/13/2018   NEUTROABS 3.4 11/13/2018    ASSESSMENT &  PLAN:  Malignant neoplasm of upper-inner quadrant of left breast in female, estrogen receptor negative ([place]) 08/17/2018:Screening mammogram detected abnormality in the left breast by ultrasound irregular mass 10:00 position 1.3 cm, 10 o'clock position no enlarged lymph nodes, ER 0%, PR 0%, Ki-67 80%, HER-2 +3+ by IHC, grade 3, lymphovascular invasion present, T1c N0 stage Ia clinical stage  Treatment plan: 1.Neo-adjuvant chemotherapy with Herceptin followed by Herceptin maintenance for 1 year 2.Breast conserving surgery with sentinel lymph node biopsy ----------------------------------------------------------------- Current treatment: Taxol Herceptin cycle11  Echo 08/31/18 shows EF of 55-60% Chemo toxicities: 1.  Elevated LFTs: Being monitored closely with reduced dosage of Taxol 2.   leg discomfort: stable today 3. Fatigue: recommended exercise.   Breast MRI has been ordered. Dr. Donne Hazel will see the patient after the MRI and plan surgery. I will see her back after surgery to discuss pathology report. Patient will continue Herceptin every 3 weeks after finishing her last chemo next week.   Orders Placed This Encounter  Procedures  . PHYSICIAN COMMUNICATION ORDER    A baseline Echo/[place] should be obtained prior to initiation of Herceptin, at 3, 6, 9 months during Herceptin  Treatment.   The patient has a good understanding of the overall plan. she agrees with it. she will call with any problems that may develop before the next visit here.  Nicholas Lose, MD 11/13/2018  Julious Oka Dorshimer am acting as scribe for Dr. Nicholas Lose.  I have reviewed the above documentation for accuracy and completeness, and I agree with the above.

## 2018-11-12 ENCOUNTER — Other Ambulatory Visit: Payer: Self-pay | Admitting: Cardiology

## 2018-11-13 ENCOUNTER — Inpatient Hospital Stay: Payer: PRIVATE HEALTH INSURANCE

## 2018-11-13 ENCOUNTER — Inpatient Hospital Stay (HOSPITAL_BASED_OUTPATIENT_CLINIC_OR_DEPARTMENT_OTHER): Payer: PRIVATE HEALTH INSURANCE | Admitting: Hematology and Oncology

## 2018-11-13 ENCOUNTER — Encounter: Payer: Self-pay | Admitting: *Deleted

## 2018-11-13 VITALS — BP 115/70 | HR 52 | Temp 97.7°F | Resp 17 | Ht 62.5 in | Wt 164.2 lb

## 2018-11-13 DIAGNOSIS — Z171 Estrogen receptor negative status [ER-]: Principal | ICD-10-CM

## 2018-11-13 DIAGNOSIS — R7989 Other specified abnormal findings of blood chemistry: Secondary | ICD-10-CM

## 2018-11-13 DIAGNOSIS — R5383 Other fatigue: Secondary | ICD-10-CM | POA: Diagnosis not present

## 2018-11-13 DIAGNOSIS — C50212 Malignant neoplasm of upper-inner quadrant of left female breast: Secondary | ICD-10-CM

## 2018-11-13 DIAGNOSIS — Z5112 Encounter for antineoplastic immunotherapy: Secondary | ICD-10-CM | POA: Diagnosis not present

## 2018-11-13 DIAGNOSIS — Z87891 Personal history of nicotine dependence: Secondary | ICD-10-CM

## 2018-11-13 LAB — CBC WITH DIFFERENTIAL (CANCER CENTER ONLY)
Abs Immature Granulocytes: 0.13 10*3/uL — ABNORMAL HIGH (ref 0.00–0.07)
Basophils Absolute: 0 10*3/uL (ref 0.0–0.1)
Basophils Relative: 0 %
Eosinophils Absolute: 0.1 10*3/uL (ref 0.0–0.5)
Eosinophils Relative: 2 %
HEMATOCRIT: 34.5 % — AB (ref 36.0–46.0)
HEMOGLOBIN: 12 g/dL (ref 12.0–15.0)
Immature Granulocytes: 2 %
LYMPHS ABS: 2.1 10*3/uL (ref 0.7–4.0)
Lymphocytes Relative: 35 %
MCH: 35 pg — ABNORMAL HIGH (ref 26.0–34.0)
MCHC: 34.8 g/dL (ref 30.0–36.0)
MCV: 100.6 fL — ABNORMAL HIGH (ref 80.0–100.0)
Monocytes Absolute: 0.3 10*3/uL (ref 0.1–1.0)
Monocytes Relative: 5 %
Neutro Abs: 3.4 10*3/uL (ref 1.7–7.7)
Neutrophils Relative %: 56 %
Platelet Count: 170 10*3/uL (ref 150–400)
RBC: 3.43 MIL/uL — ABNORMAL LOW (ref 3.87–5.11)
RDW: 13.6 % (ref 11.5–15.5)
WBC Count: 6.1 10*3/uL (ref 4.0–10.5)
nRBC: 0 % (ref 0.0–0.2)

## 2018-11-13 LAB — CMP (CANCER CENTER ONLY)
ALBUMIN: 3.6 g/dL (ref 3.5–5.0)
ALT: 72 U/L — ABNORMAL HIGH (ref 0–44)
AST: 25 U/L (ref 15–41)
Alkaline Phosphatase: 78 U/L (ref 38–126)
Anion gap: 9 (ref 5–15)
BUN: 15 mg/dL (ref 8–23)
CO2: 26 mmol/L (ref 22–32)
Calcium: 8.9 mg/dL (ref 8.9–10.3)
Chloride: 105 mmol/L (ref 98–111)
Creatinine: 0.73 mg/dL (ref 0.44–1.00)
GFR, Est AFR Am: >60 mL/min (ref >60)
GFR, Estimated: >60 mL/min (ref >60)
Glucose, Bld: 132 mg/dL — ABNORMAL HIGH (ref 70–99)
Potassium: 4.1 mmol/L (ref 3.5–5.1)
SODIUM: 140 mmol/L (ref 135–145)
Total Bilirubin: 0.7 mg/dL (ref 0.3–1.2)
Total Protein: 5.9 g/dL — ABNORMAL LOW (ref 6.5–8.1)

## 2018-11-13 MED ORDER — DIPHENHYDRAMINE HCL 25 MG PO CAPS
25.0000 mg | ORAL_CAPSULE | Freq: Once | ORAL | Status: AC
  Administered 2018-11-13: 25 mg via ORAL

## 2018-11-13 MED ORDER — SODIUM CHLORIDE 0.9 % IV SOLN
50.0000 mg/m2 | Freq: Once | INTRAVENOUS | Status: AC
  Administered 2018-11-13: 90 mg via INTRAVENOUS
  Filled 2018-11-13: qty 15

## 2018-11-13 MED ORDER — ACETAMINOPHEN 325 MG PO TABS
650.0000 mg | ORAL_TABLET | Freq: Once | ORAL | Status: AC
  Administered 2018-11-13: 650 mg via ORAL

## 2018-11-13 MED ORDER — SODIUM CHLORIDE 0.9% FLUSH
10.0000 mL | INTRAVENOUS | Status: DC | PRN
  Administered 2018-11-13: 10 mL
  Filled 2018-11-13: qty 10

## 2018-11-13 MED ORDER — DIPHENHYDRAMINE HCL 25 MG PO CAPS
ORAL_CAPSULE | ORAL | Status: AC
  Filled 2018-11-13: qty 1

## 2018-11-13 MED ORDER — FAMOTIDINE IN NACL 20-0.9 MG/50ML-% IV SOLN
INTRAVENOUS | Status: AC
  Filled 2018-11-13: qty 50

## 2018-11-13 MED ORDER — HEPARIN SOD (PORK) LOCK FLUSH 100 UNIT/ML IV SOLN
500.0000 [IU] | Freq: Once | INTRAVENOUS | Status: AC | PRN
  Administered 2018-11-13: 500 [IU]
  Filled 2018-11-13: qty 5

## 2018-11-13 MED ORDER — SODIUM CHLORIDE 0.9 % IV SOLN
Freq: Once | INTRAVENOUS | Status: AC
  Administered 2018-11-13: 09:00:00 via INTRAVENOUS
  Filled 2018-11-13: qty 250

## 2018-11-13 MED ORDER — TRASTUZUMAB CHEMO 150 MG IV SOLR
150.0000 mg | Freq: Once | INTRAVENOUS | Status: AC
  Administered 2018-11-13: 150 mg via INTRAVENOUS
  Filled 2018-11-13: qty 7.14

## 2018-11-13 MED ORDER — FAMOTIDINE IN NACL 20-0.9 MG/50ML-% IV SOLN
20.0000 mg | Freq: Once | INTRAVENOUS | Status: AC
  Administered 2018-11-13: 20 mg via INTRAVENOUS

## 2018-11-13 MED ORDER — ACETAMINOPHEN 325 MG PO TABS
ORAL_TABLET | ORAL | Status: AC
  Filled 2018-11-13: qty 2

## 2018-11-13 MED ORDER — SODIUM CHLORIDE 0.9 % IV SOLN
20.0000 mg | Freq: Once | INTRAVENOUS | Status: AC
  Administered 2018-11-13: 20 mg via INTRAVENOUS
  Filled 2018-11-13: qty 2

## 2018-11-13 NOTE — Assessment & Plan Note (Signed)
08/17/2018:Screening mammogram detected abnormality in the left breast by ultrasound irregular mass 10:00 position 1.3 cm, 10 o'clock position no enlarged lymph nodes, ER 0%, PR 0%, Ki-67 80%, HER-2 +3+ by IHC, grade 3, lymphovascular invasion present, T1c N0 stage Ia clinical stage  Treatment plan: 1.Neo-adjuvant chemotherapy with Herceptin followed by Herceptin maintenance for 1 year 2.Breast conserving surgery with sentinel lymph node biopsy ----------------------------------------------------------------- Current treatment: Taxol Herceptin cycle11  Echo 08/31/18 shows EF of 55-60% Chemo toxicities: 1.  Elevated LFTs: Being monitored closely with reduced dosage of Taxol 2.   leg discomfort: stable today 3. Fatigue: recommended exercise.   Breast MRI has been ordered. Dr. Donne Hazel will see the patient after the MRI and plan surgery. I will see her back after surgery to discuss pathology report.

## 2018-11-13 NOTE — Patient Instructions (Signed)
[facility] Discharge Instructions for Patients Receiving Chemotherapy  Today you received the following chemotherapy agents Taxol and Herceptin  To help prevent nausea and vomiting after your treatment, we encourage you to take your nausea medication as directed   If you develop nausea and vomiting that is not controlled by your nausea medication, call the clinic.   BELOW ARE SYMPTOMS THAT SHOULD BE REPORTED IMMEDIATELY:  *FEVER GREATER THAN 100.5 F  *CHILLS WITH OR WITHOUT FEVER  NAUSEA AND VOMITING THAT IS NOT CONTROLLED WITH YOUR NAUSEA MEDICATION  *UNUSUAL SHORTNESS OF BREATH  *UNUSUAL BRUISING OR BLEEDING  TENDERNESS IN MOUTH AND THROAT WITH OR WITHOUT PRESENCE OF ULCERS  *URINARY PROBLEMS  *BOWEL PROBLEMS  UNUSUAL RASH Items with * indicate a potential emergency and should be followed up as soon as possible.  Feel free to call the clinic should you have any questions or concerns. The clinic phone number is [phone].  Please show the CHEMO ALERT CARD at check-in to the Emergency Department and triage nurse.   

## 2018-11-20 ENCOUNTER — Inpatient Hospital Stay: Payer: PRIVATE HEALTH INSURANCE

## 2018-11-20 ENCOUNTER — Inpatient Hospital Stay: Payer: PRIVATE HEALTH INSURANCE | Attending: Hematology and Oncology

## 2018-11-20 ENCOUNTER — Encounter: Payer: Self-pay | Admitting: *Deleted

## 2018-11-20 VITALS — BP 143/86 | HR 47 | Temp 98.2°F | Resp 16

## 2018-11-20 DIAGNOSIS — Z171 Estrogen receptor negative status [ER-]: Secondary | ICD-10-CM

## 2018-11-20 DIAGNOSIS — C50212 Malignant neoplasm of upper-inner quadrant of left female breast: Secondary | ICD-10-CM | POA: Diagnosis present

## 2018-11-20 DIAGNOSIS — Z95828 Presence of other vascular implants and grafts: Secondary | ICD-10-CM

## 2018-11-20 DIAGNOSIS — Z5112 Encounter for antineoplastic immunotherapy: Secondary | ICD-10-CM | POA: Insufficient documentation

## 2018-11-20 DIAGNOSIS — Z5111 Encounter for antineoplastic chemotherapy: Secondary | ICD-10-CM | POA: Insufficient documentation

## 2018-11-20 LAB — CBC WITH DIFFERENTIAL (CANCER CENTER ONLY)
Abs Immature Granulocytes: 0.22 10*3/uL — ABNORMAL HIGH (ref 0.00–0.07)
Basophils Absolute: 0 10*3/uL (ref 0.0–0.1)
Basophils Relative: 0 %
Eosinophils Absolute: 0.1 10*3/uL (ref 0.0–0.5)
Eosinophils Relative: 1 %
HCT: 33.7 % — ABNORMAL LOW (ref 36.0–46.0)
Hemoglobin: 11.8 g/dL — ABNORMAL LOW (ref 12.0–15.0)
Immature Granulocytes: 4 %
Lymphocytes Relative: 27 %
Lymphs Abs: 1.6 10*3/uL (ref 0.7–4.0)
MCH: 35.9 pg — ABNORMAL HIGH (ref 26.0–34.0)
MCHC: 35 g/dL (ref 30.0–36.0)
MCV: 102.4 fL — ABNORMAL HIGH (ref 80.0–100.0)
Monocytes Absolute: 0.4 10*3/uL (ref 0.1–1.0)
Monocytes Relative: 6 %
NEUTROS PCT: 62 %
Neutro Abs: 3.8 10*3/uL (ref 1.7–7.7)
Platelet Count: 177 10*3/uL (ref 150–400)
RBC: 3.29 MIL/uL — ABNORMAL LOW (ref 3.87–5.11)
RDW: 14.3 % (ref 11.5–15.5)
WBC Count: 6.1 10*3/uL (ref 4.0–10.5)
nRBC: 0.7 % — ABNORMAL HIGH (ref 0.0–0.2)

## 2018-11-20 LAB — CMP (CANCER CENTER ONLY)
ALT: 54 U/L — ABNORMAL HIGH (ref 0–44)
AST: 22 U/L (ref 15–41)
Albumin: 3.5 g/dL (ref 3.5–5.0)
Alkaline Phosphatase: 80 U/L (ref 38–126)
Anion gap: 8 (ref 5–15)
BILIRUBIN TOTAL: 0.8 mg/dL (ref 0.3–1.2)
BUN: 13 mg/dL (ref 8–23)
CO2: 26 mmol/L (ref 22–32)
Calcium: 8.8 mg/dL — ABNORMAL LOW (ref 8.9–10.3)
Chloride: 106 mmol/L (ref 98–111)
Creatinine: 0.71 mg/dL (ref 0.44–1.00)
GFR, Est AFR Am: >60 mL/min (ref >60)
Glucose, Bld: 177 mg/dL — ABNORMAL HIGH (ref 70–99)
Potassium: 4.2 mmol/L (ref 3.5–5.1)
Sodium: 140 mmol/L (ref 135–145)
Total Protein: 5.9 g/dL — ABNORMAL LOW (ref 6.5–8.1)

## 2018-11-20 MED ORDER — ACETAMINOPHEN 325 MG PO TABS
ORAL_TABLET | ORAL | Status: AC
  Filled 2018-11-20: qty 2

## 2018-11-20 MED ORDER — SODIUM CHLORIDE 0.9% FLUSH
10.0000 mL | INTRAVENOUS | Status: DC | PRN
  Administered 2018-11-20: 10 mL
  Filled 2018-11-20: qty 10

## 2018-11-20 MED ORDER — HEPARIN SOD (PORK) LOCK FLUSH 100 UNIT/ML IV SOLN
500.0000 [IU] | Freq: Once | INTRAVENOUS | Status: AC | PRN
  Administered 2018-11-20: 500 [IU]
  Filled 2018-11-20: qty 5

## 2018-11-20 MED ORDER — SODIUM CHLORIDE 0.9 % IV SOLN
Freq: Once | INTRAVENOUS | Status: AC
  Administered 2018-11-20: 12:00:00 via INTRAVENOUS
  Filled 2018-11-20: qty 250

## 2018-11-20 MED ORDER — FAMOTIDINE IN NACL 20-0.9 MG/50ML-% IV SOLN
INTRAVENOUS | Status: AC
  Filled 2018-11-20: qty 50

## 2018-11-20 MED ORDER — SODIUM CHLORIDE 0.9% FLUSH
10.0000 mL | Freq: Once | INTRAVENOUS | Status: AC
  Administered 2018-11-20: 10 mL
  Filled 2018-11-20: qty 10

## 2018-11-20 MED ORDER — ACETAMINOPHEN 325 MG PO TABS
650.0000 mg | ORAL_TABLET | Freq: Once | ORAL | Status: AC
  Administered 2018-11-20: 650 mg via ORAL

## 2018-11-20 MED ORDER — DIPHENHYDRAMINE HCL 25 MG PO CAPS
ORAL_CAPSULE | ORAL | Status: AC
  Filled 2018-11-20: qty 1

## 2018-11-20 MED ORDER — FAMOTIDINE IN NACL 20-0.9 MG/50ML-% IV SOLN
20.0000 mg | Freq: Once | INTRAVENOUS | Status: AC
  Administered 2018-11-20: 20 mg via INTRAVENOUS

## 2018-11-20 MED ORDER — SODIUM CHLORIDE 0.9 % IV SOLN
50.0000 mg/m2 | Freq: Once | INTRAVENOUS | Status: AC
  Administered 2018-11-20: 90 mg via INTRAVENOUS
  Filled 2018-11-20: qty 15

## 2018-11-20 MED ORDER — DIPHENHYDRAMINE HCL 25 MG PO CAPS
25.0000 mg | ORAL_CAPSULE | Freq: Once | ORAL | Status: AC
  Administered 2018-11-20: 25 mg via ORAL

## 2018-11-20 MED ORDER — TRASTUZUMAB CHEMO 150 MG IV SOLR
450.0000 mg | Freq: Once | INTRAVENOUS | Status: AC
  Administered 2018-11-20: 450 mg via INTRAVENOUS
  Filled 2018-11-20: qty 21.43

## 2018-11-20 MED ORDER — SODIUM CHLORIDE 0.9 % IV SOLN
20.0000 mg | Freq: Once | INTRAVENOUS | Status: AC
  Administered 2018-11-20: 20 mg via INTRAVENOUS
  Filled 2018-11-20: qty 2

## 2018-11-20 NOTE — Patient Instructions (Signed)
[facility] Discharge Instructions for Patients Receiving Chemotherapy  Today you received the following chemotherapy agents Taxol and Herceptin  To help prevent nausea and vomiting after your treatment, we encourage you to take your nausea medication as directed   If you develop nausea and vomiting that is not controlled by your nausea medication, call the clinic.   BELOW ARE SYMPTOMS THAT SHOULD BE REPORTED IMMEDIATELY:  *FEVER GREATER THAN 100.5 F  *CHILLS WITH OR WITHOUT FEVER  NAUSEA AND VOMITING THAT IS NOT CONTROLLED WITH YOUR NAUSEA MEDICATION  *UNUSUAL SHORTNESS OF BREATH  *UNUSUAL BRUISING OR BLEEDING  TENDERNESS IN MOUTH AND THROAT WITH OR WITHOUT PRESENCE OF ULCERS  *URINARY PROBLEMS  *BOWEL PROBLEMS  UNUSUAL RASH Items with * indicate a potential emergency and should be followed up as soon as possible.  Feel free to call the clinic should you have any questions or concerns. The clinic phone number is [phone].  Please show the CHEMO ALERT CARD at check-in to the Emergency Department and triage nurse.   

## 2018-11-21 ENCOUNTER — Other Ambulatory Visit: Payer: Self-pay | Admitting: Cardiology

## 2018-11-22 ENCOUNTER — Ambulatory Visit (HOSPITAL_COMMUNITY)
Admission: RE | Admit: 2018-11-22 | Discharge: 2018-11-22 | Disposition: A | Discharge status: Home / Self Care | Payer: PRIVATE HEALTH INSURANCE | Source: Ambulatory Visit | Attending: Adult Health | Admitting: Adult Health

## 2018-11-22 DIAGNOSIS — Z171 Estrogen receptor negative status [ER-]: Secondary | ICD-10-CM | POA: Diagnosis present

## 2018-11-22 DIAGNOSIS — C50212 Malignant neoplasm of upper-inner quadrant of left female breast: Secondary | ICD-10-CM | POA: Diagnosis not present

## 2018-11-22 MED ORDER — GADOBUTROL 1 MMOL/ML IV SOLN
7.0000 mL | Freq: Once | INTRAVENOUS | Status: AC | PRN
  Administered 2018-11-22: 7 mL via INTRAVENOUS

## 2018-11-23 ENCOUNTER — Other Ambulatory Visit: Payer: Self-pay | Admitting: General Surgery

## 2018-11-23 DIAGNOSIS — C50211 Malignant neoplasm of upper-inner quadrant of right female breast: Secondary | ICD-10-CM

## 2018-11-23 DIAGNOSIS — Z171 Estrogen receptor negative status [ER-]: Principal | ICD-10-CM

## 2018-11-25 ENCOUNTER — Other Ambulatory Visit: Payer: Self-pay | Admitting: Cardiovascular Disease

## 2018-11-26 ENCOUNTER — Telehealth: Payer: Self-pay | Admitting: Hematology and Oncology

## 2018-11-26 NOTE — Telephone Encounter (Signed)
R/s appt per 2/10 sch message - pt is aware of apts

## 2018-11-29 ENCOUNTER — Ambulatory Visit (HOSPITAL_COMMUNITY)
Admission: RE | Admit: 2018-11-29 | Discharge: 2018-11-29 | Disposition: A | Discharge status: Home / Self Care | Payer: PRIVATE HEALTH INSURANCE | Source: Ambulatory Visit | Attending: General Surgery | Admitting: General Surgery

## 2018-11-29 ENCOUNTER — Other Ambulatory Visit (HOSPITAL_COMMUNITY): Payer: Self-pay | Admitting: *Deleted

## 2018-11-29 ENCOUNTER — Other Ambulatory Visit: Payer: Self-pay | Admitting: Cardiovascular Disease

## 2018-11-29 DIAGNOSIS — C50411 Malignant neoplasm of upper-outer quadrant of right female breast: Secondary | ICD-10-CM | POA: Diagnosis present

## 2018-11-29 DIAGNOSIS — I080 Rheumatic disorders of both mitral and aortic valves: Secondary | ICD-10-CM | POA: Insufficient documentation

## 2018-11-29 NOTE — Pre-Procedure Instructions (Signed)
Andrea Clarke  11/29/2018      [id number] - [place], [place] - [address] AT [address] [place]  [id number] Phone: [phone] Fax: [phone]    Your procedure is scheduled on Wed., Feb. 19, 2020 from 1:00PM-2:30PM  Report to [facility] Entrance "A" at 11:00AM  Call this number if you have problems the morning of surgery:  [phone]   Remember:  Do not eat after midnight on Feb. 18th  You may drink clear liquids until 3 hours (10:00AM) prior to surgery .  Clear liquids allowed are:  Water, Juice (non-citric and without pulp), Carbonated beverages, Clear Tea, Black Coffee only, Plain Jell-O only, Gatorade and Plain Popsicles only   Please complete your PRE-SURGERY ENSURE that was provided to you by 10:00AM, the day of  surgery.  Please, if able, drink it in one setting. DO NOT SIP.     Take these medicines the morning of surgery with A SIP OF WATER: Cetirizine (ZYRTEC), Dexlansoprazole (DEXILANT), Flecainide (TAMBOCOR), Atorvastatin (LIPITOR),  Montelukast (SINGULAIR),  Polyethyl Glycol-Propyl Glycol (Systane) eye drops, and Brimonidine-timolol (Combigan) eye drops  If needed: Acetaminophen (TYLENOL), Fluticasone (FLONASE), Ondansetron (ZOFRAN), and TraMADol (ULTRAM)  As of today, stop taking all Other Aspirin Products, Vitamins, Fish oils, and Herbal medications. Also stop all NSAIDS i.e. Advil, Ibuprofen, Motrin, Aleve, Anaprox, Naproxen, BC, Goody Powders, and all Supplements.   Do not wear jewelry, make-up or nail polish.  Do not wear lotions, powders, or perfumes, or deodorant.  Do not shave 48 hours prior to surgery.    Do not bring valuables to the hospital.  [facility] is not responsible for any belongings or valuables.  Contacts, dentures or bridgework may not be worn into surgery.  Leave your suitcase in the car.  After surgery it may be brought to your room.  For patients admitted to  the hospital, discharge time will be determined by your treatment team.  Patients discharged the day of surgery will not be allowed to drive home.   Special instructions:  [facility]- Preparing For Surgery  Before surgery, you can play an important role. Because skin is not sterile, your skin needs to be as free of germs as possible. You can reduce the number of germs on your skin by washing with CHG (chlorahexidine gluconate) Soap before surgery.  CHG is an antiseptic cleaner which kills germs and bonds with the skin to continue killing germs even after washing.    Oral Hygiene is also important to reduce your risk of infection.  Remember - BRUSH YOUR TEETH THE MORNING OF SURGERY WITH YOUR REGULAR TOOTHPASTE  Please do not use if you have an allergy to CHG or antibacterial soaps. If your skin becomes reddened/irritated stop using the CHG.  Do not shave (including legs and underarms) for at least 48 hours prior to first CHG shower. It is OK to shave your face.  Please follow these instructions carefully.   1. Shower the NIGHT BEFORE SURGERY and the MORNING OF SURGERY with CHG.   2. If you chose to wash your hair, wash your hair first as usual with your normal shampoo.  3. After you shampoo, rinse your hair and body thoroughly to remove the shampoo.  4. Use CHG as you would any other liquid soap. You can apply CHG directly to the skin and wash gently with a scrungie or a clean washcloth.   5. Apply  the CHG Soap to your body ONLY FROM THE NECK DOWN.  Do not use on open wounds or open sores. Avoid contact with your eyes, ears, mouth and genitals (private parts). Wash Face and genitals (private parts)  with your normal soap.  6. Wash thoroughly, paying special attention to the area where your surgery will be performed.  7. Thoroughly rinse your body with warm water from the neck down.  8. DO NOT shower/wash with your normal soap after using and rinsing off the CHG Soap.  9. Pat yourself  dry with a CLEAN TOWEL.  10. Wear CLEAN PAJAMAS to bed the night before surgery, wear comfortable clothes the morning of surgery  11. Place CLEAN SHEETS on your bed the night of your first shower and DO NOT SLEEP WITH PETS.  Day of Surgery:  Do not apply any deodorants/lotions.  Please wear clean clothes to the hospital/surgery center.   Remember to brush your teeth WITH YOUR REGULAR TOOTHPASTE.  Please read over the following fact sheets that you were given. Pain Booklet, Coughing and Deep Breathing and Surgical Site Infection Prevention

## 2018-11-29 NOTE — Progress Notes (Signed)
2D Echocardiogram has been performed.  Andrea Clarke 11/29/2018, 11:15 AM

## 2018-11-30 ENCOUNTER — Telehealth: Payer: Self-pay

## 2018-11-30 ENCOUNTER — Encounter (HOSPITAL_COMMUNITY)
Admission: RE | Admit: 2018-11-30 | Discharge: 2018-11-30 | Disposition: A | Discharge status: Home / Self Care | Payer: PRIVATE HEALTH INSURANCE | Source: Ambulatory Visit | Attending: General Surgery | Admitting: General Surgery

## 2018-11-30 ENCOUNTER — Other Ambulatory Visit: Payer: Self-pay

## 2018-11-30 ENCOUNTER — Encounter (HOSPITAL_COMMUNITY): Payer: Self-pay

## 2018-11-30 ENCOUNTER — Telehealth: Payer: Self-pay | Admitting: *Deleted

## 2018-11-30 DIAGNOSIS — R001 Bradycardia, unspecified: Secondary | ICD-10-CM | POA: Insufficient documentation

## 2018-11-30 DIAGNOSIS — R9431 Abnormal electrocardiogram [ECG] [EKG]: Secondary | ICD-10-CM | POA: Diagnosis not present

## 2018-11-30 DIAGNOSIS — Z01818 Encounter for other preprocedural examination: Secondary | ICD-10-CM | POA: Insufficient documentation

## 2018-11-30 LAB — URINALYSIS, ROUTINE W REFLEX MICROSCOPIC
Bacteria, UA: NONE SEEN
Bilirubin Urine: NEGATIVE
Glucose, UA: >=500 mg/dL — AB
Hgb urine dipstick: NEGATIVE
KETONES UR: NEGATIVE mg/dL
Leukocytes,Ua: NEGATIVE
Nitrite: NEGATIVE
Protein, ur: NEGATIVE mg/dL
Specific Gravity, Urine: 1.008 (ref 1.005–1.030)
pH: 5 (ref 5.0–8.0)

## 2018-11-30 LAB — CBC
HCT: 37.8 % (ref 36.0–46.0)
Hemoglobin: 12.6 g/dL (ref 12.0–15.0)
MCH: 35.1 pg — ABNORMAL HIGH (ref 26.0–34.0)
MCHC: 33.3 g/dL (ref 30.0–36.0)
MCV: 105.3 fL — AB (ref 80.0–100.0)
NRBC: 0.4 % — AB (ref 0.0–0.2)
Platelets: 177 10*3/uL (ref 150–400)
RBC: 3.59 MIL/uL — ABNORMAL LOW (ref 3.87–5.11)
RDW: 14.6 % (ref 11.5–15.5)
WBC: 5.2 10*3/uL (ref 4.0–10.5)

## 2018-11-30 LAB — BASIC METABOLIC PANEL
Anion gap: 9 (ref 5–15)
BUN: 9 mg/dL (ref 8–23)
CO2: 24 mmol/L (ref 22–32)
CREATININE: 0.71 mg/dL (ref 0.44–1.00)
Calcium: 9.4 mg/dL (ref 8.9–10.3)
Chloride: 105 mmol/L (ref 98–111)
GFR calc Af Amer: >60 mL/min (ref >60)
GFR calc non Af Amer: >60 mL/min (ref >60)
Glucose, Bld: 173 mg/dL — ABNORMAL HIGH (ref 70–99)
Potassium: 4.1 mmol/L (ref 3.5–5.1)
Sodium: 138 mmol/L (ref 135–145)

## 2018-11-30 NOTE — Telephone Encounter (Addendum)
"  Andrea Clarke [phone]).  I think I have a UTI.  I'm for surgery next Wednesday so I think I need to have this taken care of.  Theres a constant feeling Of having to urinate but can't go and feel a painful pressure.  Noticed a little on Wednesday, got worse yesterday and pain woke me up last night.  Yesterday I saw a tinge of pink.  No thermometer to check temperature.  Driving to arrive at 10:45 am for my 11:00 am pre-op appointment.  Not sure how long I'll be there.  If I need to come there I will."  Confirmed notification being sent to a provider.  [facility] will call with instructions or orders.  Andrea Clarke received Taxol/Herceptin on 11-20-2018.

## 2018-11-30 NOTE — Telephone Encounter (Signed)
Called pt to discuss symptoms per Triage nurse discussion w/ pt earlier. Pt labs and ua/culture drawn for her pre op appt. Told pt Dr.Gudena is out of office today but will forward to APP for further evaluation.   Preliminary results shows negative for UTI at this time. Per Lindsey,NP, wait for final culture to determine if antibiotic use is needed.   Asked if pt feeling some constipation, back/flank pain, dehydration. Pt states that she has been constipated and will start to take stool softener to see if it takes away lower abdominal pain. Denies flank/back pain and no fevers at this time.   Suggested that pt take oral otc analgesic for pain such as tylenol, or may take azo for pain relief. Stool softener will Instructed pt to drink plenty of fluids over the weekend,as well as to start taking probiotic supplement to help with aiding good urinary tract health. Will call pt on Monday once final culture is resulted. Pt verbalized understanding and will call if symptoms worsen or go to the closest urgent care/ed over the weekend. Pt verbalized understanding.

## 2018-11-30 NOTE — Telephone Encounter (Signed)
Will contact patient right away.

## 2018-11-30 NOTE — Progress Notes (Signed)
PCP - Dr. Karle Starch  Cardiologist - Dr. Haroldine Laws- to see on 12/11/18  Chest x-ray - 09/03/18 (E)  EKG - 11/30/2018  Stress Test - 09/13/17 (E)  ECHO - 11/29/2018 (E)  Cardiac Cath - Denies  AICD-na PM-na LOOP-na  Sleep Study - Denies CPAP - None  LABS- 11/30/2018: CBC, BMP, UA, UC  ASA-Denies  Pt c/o urinary pressure with frequency and urgency since Wed. Pt sts she called to the oncologist, and she was told to inform the nurse at her pre-op appointment. Call placed to Dr. Cristal Generous office, and a verbal order received to obtain a UA/UC specimen. Pt denied fever or chills.  Anesthesia- Yes- previous note  Pt denies having chest pain, sob, or fever at this time. All instructions explained to the pt, with a verbal understanding of the material. Pt agrees to go over the instructions while at home for a better understanding. The opportunity to ask questions was provided.

## 2018-12-01 LAB — URINE CULTURE: Culture: NO GROWTH

## 2018-12-03 ENCOUNTER — Telehealth: Payer: Self-pay

## 2018-12-03 NOTE — Telephone Encounter (Signed)
Nurse reviewed labs with Dr. Lindi Adie - Pt's increase in glucose could be d/t dexamethasone.  Labs forward to PCP, Dr. Karle Starch as Juluis Rainier.  PT aware.  Symptoms of polyuria and dysuria has subsided.  Culture was negative.  No further needs at this time.

## 2018-12-03 NOTE — Anesthesia Preprocedure Evaluation (Deleted)
Anesthesia Evaluation    Airway        Dental   Pulmonary former smoker,           Cardiovascular hypertension,      Neuro/Psych    GI/Hepatic   Endo/Other    Renal/GU      Musculoskeletal   Abdominal   Peds  Hematology   Anesthesia Other Findings   Reproductive/Obstetrics                                                              Anesthesia Evaluation  Patient identified by MRN, date of birth, ID band Patient awake    Reviewed: Allergy & Precautions, NPO status , Patient's Chart, lab work & pertinent test results  History of Anesthesia Complications (+) PONV and history of anesthetic complications  Airway Mallampati: II  TM Distance: >3 FB Neck ROM: Full    Dental no notable dental hx. (+) Teeth Intact, Dental Advisory Given   Pulmonary neg pulmonary ROS, former smoker,    Pulmonary exam normal breath sounds clear to auscultation       Cardiovascular hypertension, Normal cardiovascular exam+ dysrhythmias (on flecainide) Supra Ventricular Tachycardia  Rhythm:Regular Rate:Normal  TTE 08/2018 EF 55-60%, no valvular abnormalities   Neuro/Psych negative neurological ROS  negative psych ROS   GI/Hepatic Neg liver ROS, GERD  ,  Endo/Other  negative endocrine ROS  Renal/GU negative Renal ROS  negative genitourinary   Musculoskeletal negative musculoskeletal ROS (+)   Abdominal   Peds  Hematology negative hematology ROS (+)   Anesthesia Other Findings Left breast cancer  Reproductive/Obstetrics                           Anesthesia Physical Anesthesia Plan  ASA: III  Anesthesia Plan: General   Post-op Pain Management:    Induction: Intravenous  PONV Risk Score and Plan: 4 or greater and Ondansetron, Dexamethasone, Midazolam, Scopolamine patch - Pre-op and TIVA  Airway Management Planned: Oral ETT and LMA  Additional  Equipment:   Intra-op Plan:   Post-operative Plan: Extubation in OR  Informed Consent: I have reviewed the patients History and Physical, chart, labs and discussed the procedure including the risks, benefits and alternatives for the proposed anesthesia with the patient or authorized representative who has indicated his/her understanding and acceptance.   Dental advisory given  Plan Discussed with: CRNA  Anesthesia Plan Comments: (PAT note written 08/31/2018 by Myra Gianotti, PA-C. )       Anesthesia Quick Evaluation                                   Anesthesia Evaluation  Patient identified by MRN, date of birth, ID band Patient awake    Reviewed: Allergy & Precautions, NPO status , Patient's Chart, lab work & pertinent test results  History of Anesthesia Complications (+) PONV and history of anesthetic complications  Airway Mallampati: II  TM Distance: >3 FB Neck ROM: Full    Dental no notable dental hx. (+) Teeth Intact, Dental Advisory Given   Pulmonary neg pulmonary ROS, former smoker,    Pulmonary exam normal breath sounds clear to auscultation  Cardiovascular hypertension, Normal cardiovascular exam+ dysrhythmias (on flecainide) Supra Ventricular Tachycardia  Rhythm:Regular Rate:Normal  TTE 08/2018 EF 55-60%, no valvular abnormalities   Neuro/Psych negative neurological ROS  negative psych ROS   GI/Hepatic Neg liver ROS, GERD  ,  Endo/Other  negative endocrine ROS  Renal/GU negative Renal ROS  negative genitourinary   Musculoskeletal negative musculoskeletal ROS (+)   Abdominal   Peds  Hematology negative hematology ROS (+)   Anesthesia Other Findings Left breast cancer  Reproductive/Obstetrics                           Anesthesia Physical Anesthesia Plan  ASA: III  Anesthesia Plan: General   Post-op Pain Management:    Induction: Intravenous  PONV Risk Score and Plan: 4 or greater and  Ondansetron, Dexamethasone, Midazolam, Scopolamine patch - Pre-op and TIVA  Airway Management Planned: Oral ETT and LMA  Additional Equipment:   Intra-op Plan:   Post-operative Plan: Extubation in OR  Informed Consent: I have reviewed the patients History and Physical, chart, labs and discussed the procedure including the risks, benefits and alternatives for the proposed anesthesia with the patient or authorized representative who has indicated his/her understanding and acceptance.   Dental advisory given  Plan Discussed with: CRNA  Anesthesia Plan Comments: (PAT note written 08/31/2018 by Myra Gianotti, PA-C. )       Anesthesia Quick Evaluation  Anesthesia Physical Anesthesia Plan  ASA:   Anesthesia Plan:    Post-op Pain Management:    Induction:   PONV Risk Score and Plan:   Airway Management Planned:   Additional Equipment:   Intra-op Plan:   Post-operative Plan:   Informed Consent:   Plan Discussed with:   Anesthesia Plan Comments: (See recent note by Myra Gianotti, PA-C 08/31/2018. In the interim she has had repeat monitoring TTE 11/29/18 with EF 55-60%, normal wall motion, no significant valvular abnormalities.)        Anesthesia Quick Evaluation

## 2018-12-04 ENCOUNTER — Ambulatory Visit
Admission: RE | Admit: 2018-12-04 | Discharge: 2018-12-04 | Disposition: A | Discharge status: Home / Self Care | Payer: PRIVATE HEALTH INSURANCE | Source: Ambulatory Visit | Attending: General Surgery | Admitting: General Surgery

## 2018-12-04 DIAGNOSIS — C50211 Malignant neoplasm of upper-inner quadrant of right female breast: Secondary | ICD-10-CM

## 2018-12-04 DIAGNOSIS — Z171 Estrogen receptor negative status [ER-]: Principal | ICD-10-CM

## 2018-12-04 NOTE — Anesthesia Preprocedure Evaluation (Signed)
Anesthesia Evaluation  Patient identified by MRN, date of birth, ID band Patient awake    Reviewed: Allergy & Precautions, NPO status , Patient's Chart, lab work & pertinent test results  History of Anesthesia Complications (+) PONV and history of anesthetic complications  Airway Mallampati: II  TM Distance: >3 FB Neck ROM: Full    Dental no notable dental hx. (+) Teeth Intact, Dental Advisory Given   Pulmonary neg pulmonary ROS, former smoker,    Pulmonary exam normal breath sounds clear to auscultation       Cardiovascular hypertension, Normal cardiovascular exam+ dysrhythmias (on flecainide) Supra Ventricular Tachycardia  Rhythm:Regular Rate:Normal  TTE 08/2018 EF 55-60%, no valvular abnormalities   Neuro/Psych negative neurological ROS  negative psych ROS   GI/Hepatic Neg liver ROS, GERD  ,  Endo/Other  negative endocrine ROS  Renal/GU negative Renal ROS  negative genitourinary   Musculoskeletal negative musculoskeletal ROS (+)   Abdominal   Peds  Hematology negative hematology ROS (+)   Anesthesia Other Findings Left breast cancer  Reproductive/Obstetrics                             Anesthesia Physical  Anesthesia Plan  ASA: III  Anesthesia Plan: General   Post-op Pain Management: GA combined w/ Regional for post-op pain   Induction: Intravenous  PONV Risk Score and Plan: 4 or greater and Ondansetron, Dexamethasone, Midazolam, Scopolamine patch - Pre-op and TIVA  Airway Management Planned: Oral ETT and LMA  Additional Equipment:   Intra-op Plan:   Post-operative Plan: Extubation in OR  Informed Consent: I have reviewed the patients History and Physical, chart, labs and discussed the procedure including the risks, benefits and alternatives for the proposed anesthesia with the patient or authorized representative who has indicated his/her understanding and acceptance.      Dental advisory given  Plan Discussed with: CRNA, Anesthesiologist and Surgeon  Anesthesia Plan Comments: (PAT note written 08/31/2018 by Myra Gianotti, PA-C. )        Anesthesia Quick Evaluation

## 2018-12-04 NOTE — Anesthesia Preprocedure Evaluation (Deleted)
Anesthesia Evaluation    Airway       Dental   Pulmonary former smoker,          Cardiovascular hypertension,     Neuro/Psych    GI/Hepatic   Endo/Other    Renal/GU      Musculoskeletal   Abdominal   Peds  Hematology   Anesthesia Other Findings   Reproductive/Obstetrics                          Anesthesia Physical Anesthesia Plan Anesthesia Quick Evaluation  

## 2018-12-05 ENCOUNTER — Ambulatory Visit (HOSPITAL_COMMUNITY): Payer: PRIVATE HEALTH INSURANCE | Admitting: Anesthesiology

## 2018-12-05 ENCOUNTER — Ambulatory Visit (HOSPITAL_COMMUNITY)
Admission: RE | Admit: 2018-12-05 | Discharge: 2018-12-05 | Disposition: A | Discharge status: Home / Self Care | Payer: PRIVATE HEALTH INSURANCE | Attending: General Surgery | Admitting: General Surgery

## 2018-12-05 ENCOUNTER — Encounter (HOSPITAL_COMMUNITY): Payer: Self-pay | Admitting: Orthopedic Surgery

## 2018-12-05 ENCOUNTER — Ambulatory Visit
Admission: RE | Admit: 2018-12-05 | Discharge: 2018-12-05 | Disposition: A | Discharge status: Home / Self Care | Payer: PRIVATE HEALTH INSURANCE | Source: Ambulatory Visit | Attending: General Surgery | Admitting: General Surgery

## 2018-12-05 ENCOUNTER — Other Ambulatory Visit: Payer: Self-pay

## 2018-12-05 ENCOUNTER — Encounter (HOSPITAL_COMMUNITY)
Admission: RE | Disposition: A | Discharge status: Home / Self Care | Payer: Self-pay | Source: Home / Self Care | Attending: General Surgery

## 2018-12-05 ENCOUNTER — Encounter (HOSPITAL_COMMUNITY)
Admission: RE | Admit: 2018-12-05 | Discharge: 2018-12-05 | Disposition: A | Discharge status: Home / Self Care | Payer: PRIVATE HEALTH INSURANCE | Source: Ambulatory Visit | Attending: General Surgery | Admitting: General Surgery

## 2018-12-05 DIAGNOSIS — C50211 Malignant neoplasm of upper-inner quadrant of right female breast: Secondary | ICD-10-CM

## 2018-12-05 DIAGNOSIS — I10 Essential (primary) hypertension: Secondary | ICD-10-CM | POA: Insufficient documentation

## 2018-12-05 DIAGNOSIS — Z9221 Personal history of antineoplastic chemotherapy: Secondary | ICD-10-CM | POA: Diagnosis not present

## 2018-12-05 DIAGNOSIS — K219 Gastro-esophageal reflux disease without esophagitis: Secondary | ICD-10-CM | POA: Insufficient documentation

## 2018-12-05 DIAGNOSIS — Z87891 Personal history of nicotine dependence: Secondary | ICD-10-CM | POA: Insufficient documentation

## 2018-12-05 DIAGNOSIS — C50212 Malignant neoplasm of upper-inner quadrant of left female breast: Secondary | ICD-10-CM | POA: Insufficient documentation

## 2018-12-05 DIAGNOSIS — Z171 Estrogen receptor negative status [ER-]: Principal | ICD-10-CM

## 2018-12-05 DIAGNOSIS — Z7951 Long term (current) use of inhaled steroids: Secondary | ICD-10-CM | POA: Insufficient documentation

## 2018-12-05 DIAGNOSIS — Z79899 Other long term (current) drug therapy: Secondary | ICD-10-CM | POA: Insufficient documentation

## 2018-12-05 DIAGNOSIS — I471 Supraventricular tachycardia: Secondary | ICD-10-CM | POA: Diagnosis not present

## 2018-12-05 HISTORY — PX: BREAST LUMPECTOMY WITH RADIOACTIVE SEED AND SENTINEL LYMPH NODE BIOPSY: SHX6550

## 2018-12-05 HISTORY — PX: BREAST LUMPECTOMY: SHX2

## 2018-12-05 SURGERY — BREAST LUMPECTOMY WITH RADIOACTIVE SEED AND SENTINEL LYMPH NODE BIOPSY
Anesthesia: General | Site: Breast

## 2018-12-05 MED ORDER — MIDAZOLAM HCL 2 MG/2ML IJ SOLN
2.0000 mg | Freq: Once | INTRAMUSCULAR | Status: AC
  Administered 2018-12-05: 2 mg via INTRAVENOUS

## 2018-12-05 MED ORDER — BUPIVACAINE-EPINEPHRINE (PF) 0.25% -1:200000 IJ SOLN
INTRAMUSCULAR | Status: AC
  Filled 2018-12-05: qty 30

## 2018-12-05 MED ORDER — ENSURE PRE-SURGERY PO LIQD: 296.0000 mL | Freq: Once | ORAL | Status: DC

## 2018-12-05 MED ORDER — SODIUM CHLORIDE (PF) 0.9 % IJ SOLN
INTRAVENOUS | Status: DC | PRN
  Administered 2018-12-05: 5 mL

## 2018-12-05 MED ORDER — GABAPENTIN 100 MG PO CAPS
ORAL_CAPSULE | ORAL | Status: AC
  Administered 2018-12-05: 100 mg via ORAL
  Filled 2018-12-05: qty 1

## 2018-12-05 MED ORDER — FENTANYL CITRATE (PF) 100 MCG/2ML IJ SOLN: 100.0000 ug | Freq: Once | INTRAMUSCULAR | Status: DC

## 2018-12-05 MED ORDER — ACETAMINOPHEN 160 MG/5ML PO SOLN: 325.0000 mg | ORAL | Status: DC | PRN

## 2018-12-05 MED ORDER — FENTANYL CITRATE (PF) 100 MCG/2ML IJ SOLN
50.0000 ug | Freq: Once | INTRAMUSCULAR | Status: AC
  Administered 2018-12-05: 50 ug via INTRAVENOUS

## 2018-12-05 MED ORDER — SODIUM CHLORIDE (PF) 0.9 % IJ SOLN
INTRAMUSCULAR | Status: AC
  Filled 2018-12-05: qty 10

## 2018-12-05 MED ORDER — PROPOFOL 10 MG/ML IV BOLUS
INTRAVENOUS | Status: DC | PRN
  Administered 2018-12-05: 120 mg via INTRAVENOUS

## 2018-12-05 MED ORDER — 0.9 % SODIUM CHLORIDE (POUR BTL) OPTIME
TOPICAL | Status: DC | PRN
  Administered 2018-12-05: 1000 mL

## 2018-12-05 MED ORDER — CLONIDINE HCL (ANALGESIA) 100 MCG/ML EP SOLN
EPIDURAL | Status: DC | PRN
  Administered 2018-12-05: 100 ug

## 2018-12-05 MED ORDER — MEPERIDINE HCL 50 MG/ML IJ SOLN: 6.2500 mg | INTRAMUSCULAR | Status: DC | PRN

## 2018-12-05 MED ORDER — FENTANYL CITRATE (PF) 100 MCG/2ML IJ SOLN
INTRAMUSCULAR | Status: AC
  Administered 2018-12-05: 50 ug via INTRAVENOUS
  Filled 2018-12-05: qty 2

## 2018-12-05 MED ORDER — FENTANYL CITRATE (PF) 100 MCG/2ML IJ SOLN: 25.0000 ug | INTRAMUSCULAR | Status: DC | PRN

## 2018-12-05 MED ORDER — ACETAMINOPHEN 325 MG PO TABS: 325.0000 mg | ORAL_TABLET | ORAL | Status: DC | PRN

## 2018-12-05 MED ORDER — CIPROFLOXACIN IN D5W 400 MG/200ML IV SOLN
400.0000 mg | INTRAVENOUS | Status: AC
  Administered 2018-12-05: 400 mg via INTRAVENOUS

## 2018-12-05 MED ORDER — HEMOSTATIC AGENTS (NO CHARGE) OPTIME
TOPICAL | Status: DC | PRN
  Administered 2018-12-05: 1 via TOPICAL

## 2018-12-05 MED ORDER — LACTATED RINGERS IV SOLN
INTRAVENOUS | Status: DC
  Administered 2018-12-05: 12:00:00 via INTRAVENOUS

## 2018-12-05 MED ORDER — SUGAMMADEX SODIUM 200 MG/2ML IV SOLN
INTRAVENOUS | Status: DC | PRN
  Administered 2018-12-05: 200 mg via INTRAVENOUS

## 2018-12-05 MED ORDER — SODIUM CHLORIDE 0.9 % IV SOLN
INTRAVENOUS | Status: DC | PRN
  Administered 2018-12-05: 40 ug/min via INTRAVENOUS

## 2018-12-05 MED ORDER — FENTANYL CITRATE (PF) 250 MCG/5ML IJ SOLN
INTRAMUSCULAR | Status: AC
  Filled 2018-12-05: qty 5

## 2018-12-05 MED ORDER — ACETAMINOPHEN 500 MG PO TABS
ORAL_TABLET | ORAL | Status: AC
  Administered 2018-12-05: 1000 mg via ORAL
  Filled 2018-12-05: qty 2

## 2018-12-05 MED ORDER — GLYCOPYRROLATE 0.2 MG/ML IJ SOLN
INTRAMUSCULAR | Status: DC | PRN
  Administered 2018-12-05: 0.2 mg via INTRAVENOUS

## 2018-12-05 MED ORDER — PROPOFOL 10 MG/ML IV BOLUS
INTRAVENOUS | Status: AC
  Filled 2018-12-05: qty 20

## 2018-12-05 MED ORDER — FENTANYL CITRATE (PF) 100 MCG/2ML IJ SOLN
INTRAMUSCULAR | Status: DC | PRN
  Administered 2018-12-05: 100 ug via INTRAVENOUS

## 2018-12-05 MED ORDER — METHYLENE BLUE 0.5 % INJ SOLN
INTRAVENOUS | Status: AC
  Filled 2018-12-05: qty 10

## 2018-12-05 MED ORDER — OXYCODONE HCL 5 MG/5ML PO SOLN: 5.0000 mg | Freq: Once | ORAL | Status: DC | PRN

## 2018-12-05 MED ORDER — CIPROFLOXACIN IN D5W 400 MG/200ML IV SOLN
INTRAVENOUS | Status: AC
  Filled 2018-12-05: qty 200

## 2018-12-05 MED ORDER — ACETAMINOPHEN 500 MG PO TABS
1000.0000 mg | ORAL_TABLET | ORAL | Status: AC
  Administered 2018-12-05: 1000 mg via ORAL

## 2018-12-05 MED ORDER — ONDANSETRON HCL 4 MG/2ML IJ SOLN: 4.0000 mg | Freq: Once | INTRAMUSCULAR | Status: DC | PRN

## 2018-12-05 MED ORDER — PROPOFOL 500 MG/50ML IV EMUL
INTRAVENOUS | Status: DC | PRN
  Administered 2018-12-05: 150 ug/kg/min via INTRAVENOUS

## 2018-12-05 MED ORDER — PHENYLEPHRINE HCL 10 MG/ML IJ SOLN
INTRAMUSCULAR | Status: DC | PRN
  Administered 2018-12-05: 80 ug via INTRAVENOUS

## 2018-12-05 MED ORDER — OXYCODONE HCL 5 MG PO TABS: 5.0000 mg | ORAL_TABLET | Freq: Once | ORAL | Status: DC | PRN

## 2018-12-05 MED ORDER — TECHNETIUM TC 99M SULFUR COLLOID FILTERED
1.0000 | Freq: Once | INTRAVENOUS | Status: AC | PRN
  Administered 2018-12-05: 1 via INTRADERMAL

## 2018-12-05 MED ORDER — DEXAMETHASONE SODIUM PHOSPHATE 10 MG/ML IJ SOLN
INTRAMUSCULAR | Status: DC | PRN
  Administered 2018-12-05: 5 mg via INTRAVENOUS

## 2018-12-05 MED ORDER — BUPIVACAINE-EPINEPHRINE 0.25% -1:200000 IJ SOLN
INTRAMUSCULAR | Status: DC | PRN
  Administered 2018-12-05: 12 mL

## 2018-12-05 MED ORDER — TRAMADOL HCL 50 MG PO TABS: 100.0000 mg | ORAL_TABLET | Freq: Four times a day (QID) | ORAL | 0 refills | Status: DC | PRN

## 2018-12-05 MED ORDER — ROCURONIUM BROMIDE 100 MG/10ML IV SOLN
INTRAVENOUS | Status: DC | PRN
  Administered 2018-12-05: 50 mg via INTRAVENOUS

## 2018-12-05 MED ORDER — ONDANSETRON HCL 4 MG/2ML IJ SOLN
INTRAMUSCULAR | Status: DC | PRN
  Administered 2018-12-05: 4 mg via INTRAVENOUS

## 2018-12-05 MED ORDER — BUPIVACAINE HCL (PF) 0.5 % IJ SOLN
INTRAMUSCULAR | Status: DC | PRN
  Administered 2018-12-05: 20 mL

## 2018-12-05 MED ORDER — GABAPENTIN 100 MG PO CAPS
100.0000 mg | ORAL_CAPSULE | ORAL | Status: AC
  Administered 2018-12-05: 100 mg via ORAL

## 2018-12-05 MED ORDER — MIDAZOLAM HCL 2 MG/2ML IJ SOLN
INTRAMUSCULAR | Status: AC
  Administered 2018-12-05: 2 mg via INTRAVENOUS
  Filled 2018-12-05: qty 2

## 2018-12-05 SURGICAL SUPPLY — 58 items
ADH SKN CLS APL DERMABOND .7 (GAUZE/BANDAGES/DRESSINGS) ×1
APPLIER CLIP 9.375 MED OPEN (MISCELLANEOUS) ×2
APR CLP MED 9.3 20 MLT OPN (MISCELLANEOUS) ×1
BINDER BREAST LRG (GAUZE/BANDAGES/DRESSINGS) IMPLANT
BINDER BREAST XLRG (GAUZE/BANDAGES/DRESSINGS) ×1 IMPLANT
BLADE SURG 15 STRL LF DISP TIS (BLADE) ×1 IMPLANT
BLADE SURG 15 STRL SS (BLADE) ×2
CANISTER SUCT 3000ML PPV (MISCELLANEOUS) ×2 IMPLANT
CHLORAPREP W/TINT 26ML (MISCELLANEOUS) ×2 IMPLANT
CLIP APPLIE 9.375 MED OPEN (MISCELLANEOUS) IMPLANT
CLIP VESOCCLUDE MED 6/CT (CLIP) ×2 IMPLANT
COVER PROBE W GEL 5X96 (DRAPES) ×2 IMPLANT
COVER SURGICAL LIGHT HANDLE (MISCELLANEOUS) ×2 IMPLANT
COVER WAND RF STERILE (DRAPES) ×2 IMPLANT
DERMABOND ADVANCED (GAUZE/BANDAGES/DRESSINGS) ×1
DERMABOND ADVANCED .7 DNX12 (GAUZE/BANDAGES/DRESSINGS) ×1 IMPLANT
DEVICE DUBIN SPECIMEN MAMMOGRA (MISCELLANEOUS) ×2 IMPLANT
DRAPE CHEST BREAST 15X10 FENES (DRAPES) ×2 IMPLANT
DRAPE UTILITY XL STRL (DRAPES) ×2 IMPLANT
ELECT COATED BLADE 2.86 ST (ELECTRODE) ×2 IMPLANT
ELECT REM PT RETURN 9FT ADLT (ELECTROSURGICAL) ×2
ELECTRODE REM PT RTRN 9FT ADLT (ELECTROSURGICAL) ×1 IMPLANT
GLOVE BIO SURGEON STRL SZ7 (GLOVE) ×2 IMPLANT
GLOVE BIOGEL PI IND STRL 7.5 (GLOVE) ×1 IMPLANT
GLOVE BIOGEL PI INDICATOR 7.5 (GLOVE) ×1
GOWN STRL REUS W/ TWL LRG LVL3 (GOWN DISPOSABLE) ×2 IMPLANT
GOWN STRL REUS W/TWL LRG LVL3 (GOWN DISPOSABLE) ×4
HEMOSTAT ARISTA ABSORB 3G PWDR (HEMOSTASIS) ×1 IMPLANT
ILLUMINATOR WAVEGUIDE N/F (MISCELLANEOUS) ×1 IMPLANT
KIT BASIN OR (CUSTOM PROCEDURE TRAY) ×2 IMPLANT
KIT MARKER MARGIN INK (KITS) ×2 IMPLANT
MARKER SKIN DUAL TIP RULER LAB (MISCELLANEOUS) ×2 IMPLANT
NDL FILTER BLUNT 18X1 1/2 (NEEDLE) IMPLANT
NDL HYPO 25GX1X1/2 BEV (NEEDLE) ×1 IMPLANT
NDL SAFETY ECLIPSE 18X1.5 (NEEDLE) IMPLANT
NEEDLE FILTER BLUNT 18X 1/2SAF (NEEDLE) ×1
NEEDLE FILTER BLUNT 18X1 1/2 (NEEDLE) ×1 IMPLANT
NEEDLE HYPO 18GX1.5 SHARP (NEEDLE) ×2
NEEDLE HYPO 25GX1X1/2 BEV (NEEDLE) ×2 IMPLANT
NS IRRIG 1000ML POUR BTL (IV SOLUTION) ×2 IMPLANT
PACK SURGICAL SETUP 50X90 (CUSTOM PROCEDURE TRAY) ×2 IMPLANT
PENCIL BUTTON HOLSTER BLD 10FT (ELECTRODE) ×2 IMPLANT
SPONGE LAP 18X18 RF (DISPOSABLE) ×2 IMPLANT
STRIP CLOSURE SKIN 1/2X4 (GAUZE/BANDAGES/DRESSINGS) ×2 IMPLANT
STRIP CLOSURE SKIN 1/4X4 (GAUZE/BANDAGES/DRESSINGS) ×1 IMPLANT
SUT MNCRL AB 4-0 PS2 18 (SUTURE) ×4 IMPLANT
SUT MON AB 5-0 PS2 18 (SUTURE) ×1 IMPLANT
SUT SILK 2 0 SH (SUTURE) ×1 IMPLANT
SUT VIC AB 2-0 SH 27 (SUTURE) ×4
SUT VIC AB 2-0 SH 27XBRD (SUTURE) ×2 IMPLANT
SUT VIC AB 3-0 SH 27 (SUTURE) ×6
SUT VIC AB 3-0 SH 27X BRD (SUTURE) ×2 IMPLANT
SYR BULB 3OZ (MISCELLANEOUS) ×2 IMPLANT
SYR CONTROL 10ML LL (SYRINGE) ×2 IMPLANT
TOWEL OR 17X24 6PK STRL BLUE (TOWEL DISPOSABLE) ×2 IMPLANT
TOWEL OR 17X26 10 PK STRL BLUE (TOWEL DISPOSABLE) ×2 IMPLANT
TUBE CONNECTING 12X1/4 (SUCTIONS) ×2 IMPLANT
YANKAUER SUCT BULB TIP NO VENT (SUCTIONS) ×2 IMPLANT

## 2018-12-05 NOTE — Anesthesia Procedure Notes (Signed)
Procedure Name: Intubation Date/Time: 12/05/2018 1:38 PM Performed by: Myka Hitz T, CRNA Pre-anesthesia Checklist: Patient identified, Emergency Drugs available, Suction available and Patient being monitored Patient Re-evaluated:Patient Re-evaluated prior to induction Oxygen Delivery Method: Circle system utilized Preoxygenation: Pre-oxygenation with 100% oxygen Induction Type: IV induction Ventilation: Mask ventilation without difficulty Laryngoscope Size: Miller and 2 Grade View: Grade I Tube type: Oral Tube size: 7.0 mm Number of attempts: 1 Airway Equipment and Method: Patient positioned with wedge pillow and Stylet Placement Confirmation: ETT inserted through vocal cords under direct vision,  positive ETCO2 and breath sounds checked- equal and bilateral Secured at: 21 cm Tube secured with: Tape Dental Injury: Teeth and Oropharynx as per pre-operative assessment

## 2018-12-05 NOTE — Anesthesia Postprocedure Evaluation (Signed)
Anesthesia Post Note  Patient: Andrea Clarke  Procedure(s) Performed: LEFT BREAST LUMPECTOMY WITH RADIOACTIVE SEED AND LEFT AXILLARY SENTINEL LYMPH NODE BIOPSY AND BLUE DYE INJECTION (N/A Breast)     Patient location during evaluation: PACU Anesthesia Type: General Level of consciousness: awake and alert Pain management: pain level controlled Vital Signs Assessment: post-procedure vital signs reviewed and stable Respiratory status: spontaneous breathing, nonlabored ventilation, respiratory function stable and patient connected to nasal cannula oxygen Cardiovascular status: blood pressure returned to baseline and stable Postop Assessment: no apparent nausea or vomiting Anesthetic complications: no    Last Vitals:  Vitals:   12/05/18 1238 12/05/18 1500  BP: (!) 166/82   Pulse: (!) 53   Resp: 14   Temp:  (!) 36.3 C  SpO2: 98%     Last Pain:  Vitals:   12/05/18 1500  TempSrc:   PainSc: 0-No pain                 Rhian Funari

## 2018-12-05 NOTE — Transfer of Care (Signed)
Immediate Anesthesia Transfer of Care Note  Patient: Andrea Clarke  Procedure(s) Performed: LEFT BREAST LUMPECTOMY WITH RADIOACTIVE SEED AND LEFT AXILLARY SENTINEL LYMPH NODE BIOPSY AND BLUE DYE INJECTION (N/A Breast)  Patient Location: PACU  Anesthesia Type:GA combined with regional for post-op pain  Level of Consciousness: awake, alert  and oriented  Airway & Oxygen Therapy: Patient Spontanous Breathing and Patient connected to nasal cannula oxygen  Post-op Assessment: Report given to RN, Post -op Vital signs reviewed and stable and Patient moving all extremities  Post vital signs: Reviewed and stable  Last Vitals:  Vitals Value Taken Time  BP 127/76 12/05/2018  2:58 PM  Temp    Pulse 67 12/05/2018  3:00 PM  Resp 14 12/05/2018  3:00 PM  SpO2 100 % 12/05/2018  3:00 PM  Vitals shown include unvalidated device data.  Last Pain:  Vitals:   12/05/18 1149  TempSrc:   PainSc: 0-No pain         Complications: No apparent anesthesia complications

## 2018-12-05 NOTE — Op Note (Signed)
Preoperative diagnosis: left breast cancer s/p primary chemotherapy Postoperative diagnosis: Same as above Procedure: 1. Left breast radioactive seed guidedlumpectomy 2. Left deep axillary sentinel lymph node biopsy 3. Injection blue dye for sentinel node identification Surgeon: Dr. Serita Grammes Anesthesia: General with pectoral block Estimated blood loss: Minimal Specimens: 1. Left breast tissue marked with paint containing the radioactive seedand the clips 2. Left axillary sentinel lymph nodes with highest count of 1671 3. Additional left breast superior, lateral, medial margins marked short superior long lateral double deep Complications: None Drains: None Special count was correct at completion Disposition to recovery in stable condition  Indications: This isa 28 yof with a her 2 positive left breast cancer. She has undergone primary systemic therapy with what appears to be good response radiologically. . We discussed all of her options and elected to proceed with a seed guidedlumpectomy and axillary sentinel lymph node biopsy.  She hadaradioactive seedplaced prior to coming to the operating room. I had these mammograms available in the operating room.  Procedure: After informed consent was obtained she first underwent a pectoral block with exparel. She was injected with technetium in the standard periareolar fashion. The seedwaspresent in the breast. She had SCDs in place. Cefazolin was administered. She was then placed under general anesthesia without complication. She was prepped and draped in the standard sterile surgical fashion. A surgical timeout was then performed.  I then injected a mixture of blue dye and saline in the retroareolar position and massaged this.   Iidentified the seed in theupper inner quadrant of the left breast.I infiltrated Marcaine and made aperiareolar incision to hide the scar later. I used the neoprobe to identify the seed  and guide me to remove the seed andthe clip.I attempted to removea rim ofnormal tissue around this as well. I then removedthis and painted it.Mammogram was taken confirming removal of the seedand the clip. I also removed the additional media, lateral and superiorl margin as I felt the seed was close. Hemostasis was observed. I placed clips in the cavity. I closed the breast tissue with 2-0 Vicryl. The skin was closed with 3-0 Vicryl and5-0 Monocryl. Glue and Steri-Strips were eventually placed over the incision.  Iidentified the sentinel nodes in the axilla. I made an incision below the axillary hairline. I carried this through the axillary fascia. I then excised what appeared to be several axillary sentinel lymph nodes. The highest count as listed above. There was minimal background radioactivity. Hemostasis was observed. I then closed the axillary fascia with 2-0 Vicryl the skin was closed with 3-0 Vicryl and 4-0 Monocryl. Glue and Steri-Strips were applied. She tolerated this well was extubated and transferred to recovery stable.

## 2018-12-05 NOTE — Anesthesia Procedure Notes (Signed)
Anesthesia Regional Block: Pectoralis block   Pre-Anesthetic Checklist: ,, timeout performed, Correct Patient, Correct Site, Correct Laterality, Correct Procedure, Correct Position, site marked, Risks and benefits discussed,  Surgical consent,  Pre-op evaluation,  At surgeon's request and post-op pain management  Laterality: Left  Prep: chloraprep       Needles:  Injection technique: Single-shot  Needle Type: Echogenic Stimulator Needle     Needle Length: 5cm  Needle Gauge: 22     Additional Needles:   Procedures:, nerve stimulator,,, ultrasound used (permanent image in chart),,,,  Narrative:  Start time: 12/05/2018 12:33 PM End time: 12/05/2018 12:38 PM Injection made incrementally with aspirations every 5 mL.  Performed by: Personally  Anesthesiologist: Janeece Riggers, MD  Additional Notes: Functioning IV was confirmed and monitors were applied.  A 48mm 22ga Arrow echogenic stimulator needle was used. Sterile prep and drape,hand hygiene and sterile gloves were used. Ultrasound guidance: relevant anatomy identified, needle position confirmed, local anesthetic spread visualized around nerve(s)., vascular puncture avoided.  Image printed for medical record. Negative aspiration and negative test dose prior to incremental administration of local anesthetic. The patient tolerated the procedure well.

## 2018-12-05 NOTE — H&P (Signed)
73 yo female referred by Dr Karle Starch for new left breast cancer. she has no family history. she has no real personal history of breast disease. she had no mass or dc. she was noted on screening mm to have b density breasts. there was a possible left breast mass. dx views showed a 1.4 cm mass in the inner left breast. US shows a 1.3x0.8x1.1 cm mass at 10 oclock 7 cm from nipple. the left axilla was negative. 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 she just finished chemo last tuesday. on repeat mri the right breast is negative, all nodes are negative, the left side has mild residual nodular enhancement at the site with no dominant mass. she has tolerated chemo well.    Past Surgical History Rolm Bookbinder, MD; 11/23/2018 9:36 AM) Appendectomy  Breast Biopsy  Left. Cataract Surgery  Bilateral. Foot Surgery  Right. Hysterectomy (not due to cancer) - Complete  Shoulder Surgery  Right. Tonsillectomy   Allergies Emeline Gins, CMA; 11/23/2018 8:50 AM) No Known Drug Allergies [08/21/2018]: Allergies Reconciled   Medication History Emeline Gins, CMA; 11/23/2018 8:51 AM) Lipitor (20MG  Tablet, Oral) Active. Combigan (0.2-0.5% Solution, Ophthalmic) Active. Calcium (200MG  Tablet, Oral) Active. ZyrTEC Allergy (10MG  Tablet, Oral) Active. Dexilant (60MG  Capsule DR, Oral) Active. Cardizem (120MG  Tablet, Oral) Active. Tambocor (100MG  Tablet, Oral) Active. Flonase (50MCG/DOSE Inhaler, Nasal) Active. Lasix (20MG  Tablet, Oral) Active. Singulair (10MG  Tablet, Oral) Active. Multi-Vitamin (Oral) Active. dilTIAZem HCl ER Beads (120MG  Capsule ER 24HR, Oral) Active. Medications Reconciled  Social History Rolm Bookbinder, MD; 11/23/2018 9:36 AM) Alcohol use  Moderate alcohol use. Caffeine use  Carbonated beverages, Coffee, Tea. No drug use  Tobacco use  Former smoker.  Family History Rolm Bookbinder, MD; 11/23/2018  9:36 AM) Arthritis  Family Members In General. Cerebrovascular Accident  Family Members In General. Cervical Cancer  Sister. Depression  Father. Diabetes Mellitus  Sister. Heart Disease  Family Members In General, Mother. Hypertension  Father, Mother, Sister. Respiratory Condition  Family Members In General.  ROS negative  Vitals Emeline Gins CMA; 11/23/2018 8:50 AM) 11/23/2018 8:50 AM Weight: 165 lb Height: 64in Body Surface Area: 1.8 m Body Mass Index: 28.32 kg/m  Temp.: 97.92F  Pulse: 60 (Regular)  BP: 138/80 (Sitting, Left Arm, Standard) Physical Exam Rolm Bookbinder MD; 11/23/2018 9:36 AM) General Mental Status-Alert. Orientation-Oriented X3. Chest and Lung Exam Note: port in place right chest Breast Nipples-No Discharge. Breast Lump-No Palpable Breast Mass. Lymphatic Head & Neck General Head & Neck Lymphatics: Bilateral - Description - Normal. Axillary General Axillary Region: Bilateral - Description - Normal. 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 We discussed the performance of that with injection of radioactive tracer and blue dye. We discussed that there is a chance of having a positive node with a sentinel lymph node biopsy and we will await the permanent pathology to make any other first further decisions in terms of her treatment. One of these options might be to return to the operating room to perform an axillary lymph node dissection. We discussed up to a 5% risk lifetime of chronic shoulder pain as well as lymphedema associated with a sentinel lymph node biopsy.  We discussed the options for treatment of the breast cancer  which included lumpectomy versus a mastectomy. We discussed the performance of the lumpectomy with radioactive seed placement. We discussed a 5-10% chance of a positive margin requiring reexcision in the operating room. We also discussed that she will need radiation therapy if she undergoes lumpectomy. . We discussed the mastectomy and the postoperative care for that as well. Mastectomy can be followed by reconstruction. This is a more extensive surgery and requires more recovery. The decision for lumpectomy vs mastectomy has no impact on decision for chemotherapy. Most mastectomy patients will not need radiation therapy. We discussed that there is no difference in her survival whether she undergoes lumpectomy with radiation therapy or antiestrogen therapy versus a mastectomy. There is also no real difference between her recurrence in the breast. We discussed the risks of operation including bleeding, infection, possible reoperation.

## 2018-12-05 NOTE — Interval H&P Note (Signed)
History and Physical Interval Note:  12/05/2018 12:54 PM  Andrea Clarke  has presented today for surgery, with the diagnosis of LEFT BREAST CANCER  The various methods of treatment have been discussed with the patient and family. After consideration of risks, benefits and other options for treatment, the patient has consented to  Procedure(s): LEFT BREAST LUMPECTOMY WITH RADIOACTIVE SEED AND LEFT AXILLARY SENTINEL LYMPH NODE BIOPSY AND BLUE DYE INJECTION (N/A) as a surgical intervention .  The patient's history has been reviewed, patient examined, no change in status, stable for surgery.  I have reviewed the patient's chart and labs.  Questions were answered to the patient's satisfaction.     Rolm Bookbinder

## 2018-12-05 NOTE — Discharge Instructions (Signed)
[facility] °Office Phone Number [phone] ° °POST OP INSTRUCTIONS °Take 400 mg of ibuprofen every 8 hours or 650 mg tylenol every 6 hours for next 72 hours then as needed. Use ice several times daily also. °Always review your discharge instruction sheet given to you by the facility where your surgery was performed. ° °IF YOU HAVE DISABILITY OR FAMILY LEAVE FORMS, YOU MUST BRING THEM TO THE OFFICE FOR PROCESSING.  DO NOT GIVE THEM TO YOUR DOCTOR. ° °1. A prescription for pain medication may be given to you upon discharge.  Take your pain medication as prescribed, if needed.  If narcotic pain medicine is not needed, then you may take acetaminophen (Tylenol), naprosyn (Alleve) or ibuprofen (Advil) as needed. °2. Take your usually prescribed medications unless otherwise directed °3. If you need a refill on your pain medication, please contact your pharmacy.  They will contact our office to request authorization.  Prescriptions will not be filled after 5pm or on week-ends. °4. You should eat very light the first 24 hours after surgery, such as soup, crackers, pudding, etc.  Resume your normal diet the day after surgery. °5. Most patients will experience some swelling and bruising in the breast.  Ice packs and a good support bra will help.  Wear the breast binder provided or a sports bra for 72 hours day and night.  After that wear a sports bra during the day until you return to the office. Swelling and bruising can take several days to resolve.  °6. It is common to experience some constipation if taking pain medication after surgery.  Increasing fluid intake and taking a stool softener will usually help or prevent this problem from occurring.  A mild laxative (Milk of Magnesia or Miralax) should be taken according to package directions if there are no bowel movements after 48 hours. °7. Unless discharge instructions indicate otherwise, you may remove your bandages 48 hours after surgery and you may  shower at that time.  You may have steri-strips (small skin tapes) in place directly over the incision.  These strips should be left on the skin for 7-10 days and will come off on their own.  If your surgeon used skin glue on the incision, you may shower in 24 hours.  The glue will flake off over the next 2-3 weeks.  Any sutures or staples will be removed at the office during your follow-up visit. °8. ACTIVITIES:  You may resume regular daily activities (gradually increasing) beginning the next day.  Wearing a good support bra or sports bra minimizes pain and swelling.  You may have sexual intercourse when it is comfortable. °a. You may drive when you no longer are taking prescription pain medication, you can comfortably wear a seatbelt, and you can safely maneuver your car and apply brakes. °b. RETURN TO WORK:  ______________________________________________________________________________________ °9. You should see your doctor in the office for a follow-up appointment approximately two weeks after your surgery.  Your doctor’s nurse will typically make your follow-up appointment when she calls you with your pathology report.  Expect your pathology report 3-4 business days after your surgery.  You may call to check if you do not hear from us after three days. °10. OTHER INSTRUCTIONS: _______________________________________________________________________________________________ _____________________________________________________________________________________________________________________________________ °_____________________________________________________________________________________________________________________________________ °_____________________________________________________________________________________________________________________________________ ° °WHEN TO CALL DR Savana Spina: °1. Fever over 101.0 °2. Nausea and/or vomiting. °3. Extreme swelling or bruising. °4. Continued bleeding from  incision. °5. Increased pain, redness, or drainage from the incision. ° °The clinic staff is available to   answer your questions during regular business hours.  Please don’t hesitate to call and ask to speak to one of the nurses for clinical concerns.  If you have a medical emergency, go to the nearest emergency room or call 911.  A surgeon from [facility] is always on call at the hospital. ° °For further questions, please visit [email or web address] ° °

## 2018-12-06 ENCOUNTER — Encounter (HOSPITAL_COMMUNITY): Payer: Self-pay | Admitting: General Surgery

## 2018-12-07 NOTE — Progress Notes (Signed)
Patient Care Team: [name], MD as PCP - General (Internal Medicine)  DIAGNOSIS:    ICD-10-CM   1. Malignant neoplasm of upper-inner quadrant of left breast in female, estrogen receptor negative ([place]) C50.212    Z17.1     SUMMARY OF ONCOLOGIC HISTORY:   Malignant neoplasm of upper-inner quadrant of left breast in female, estrogen receptor negative ([place])   08/17/2018 Initial Diagnosis    Screening mammogram detected abnormality in the left breast by ultrasound irregular mass 10:00 position 1.3 cm, 10 o'clock position no enlarged lymph nodes, ER 0%, PR 0%, Ki-67 80%, HER-2 +3+ by [facility]    08/28/2018 Cancer Staging    Staging form: Breast, AJCC 8th Edition - Clinical stage from 08/28/2018: Stage IA (cT1c, cN0, cM0, G3, ER-, PR-, HER2+) - Signed by Nicholas Lose, MD on 08/28/2018    08/28/2018 Breast MRI    malignancy within the UPPER INNER LEFT breast with dominant mass measuring 2.1 cm. With small adjacent satellite nodules, the entire area 1.5 x 3 x 2.2 cm.No evidence of abnormal lymph nodes or RIGHT breast malignancy.    09/03/2018 -  Chemotherapy    Weekly Taxol and Herceptin followed by maintenance Herceptin for one year.     12/05/2018 Surgery    Left lumpectomy: No residual invasive carcinoma, margins negative, 0/2 lymph nodes negative, complete pathologic response [id number]     CHIEF COMPLIANT: Follow-up s/p lumpectomy to review pathology and Herceptin maintenance  INTERVAL HISTORY: Andrea Clarke is a 73 y.o. with above-mentioned history of left breast cancer who received 12 cycles of Taxol with Herceptin and underwent a left lumpectomy on 12/05/18 for which pathology showed no residual carcinoma and no lymph node involvement. An ECHO from 11/29/18 showed an ejection fraction in the range of 55-60%. She presents to the clinic today with her daughter and is healing well from surgery. She is worried about high blood sugar and will follow up with her PCP. Her labs from today  show WBC 5.4, Hg 12.8, platelets 166, ANC 3.2.   REVIEW OF SYSTEMS:   Constitutional: Denies fevers, chills or abnormal weight loss Eyes: Denies blurriness of vision Ears, nose, mouth, throat, and face: Denies mucositis or sore throat Respiratory: Denies cough, dyspnea or wheezes Cardiovascular: Denies palpitation, chest discomfort Gastrointestinal: Denies nausea, heartburn or change in bowel habits Skin: Denies abnormal skin rashes Lymphatics: Denies new lymphadenopathy or easy bruising Neurological: Denies numbness, tingling or new weaknesses Behavioral/Psych: Mood is stable, no new changes  Extremities: No lower extremity edema Breast: denies any pain or lumps or nodules in either breasts All other systems were reviewed with the patient and are negative.  I have reviewed the past medical history, past surgical history, social history and family history with the patient and they are unchanged from previous note.  ALLERGIES:  is allergic to adhesive [tape]; oxycodone hcl; augmentin [amoxicillin-pot clavulanate]; and xylocaine [lidocaine hcl].  MEDICATIONS:  Current Outpatient Medications  Medication Sig Dispense Refill  . acetaminophen (TYLENOL) 500 MG tablet Take 1,000 mg by mouth daily as needed for moderate pain or headache.     Marland Kitchen atorvastatin (LIPITOR) 20 MG tablet Take 20 mg by mouth daily.    . brimonidine-timolol (COMBIGAN) 0.2-0.5 % ophthalmic solution Place 1 drop into both eyes every 12 (twelve) hours.    Marland Kitchen CARTIA XT 120 MG 24 hr capsule TAKE ONE CAPSULE BY MOUTH DAILY, STOPPING METOPROLOL 90 capsule 3  . cetirizine (ZYRTEC) 10 MG tablet Take 10 mg by mouth daily.    Marland Kitchen  Cholecalciferol (VITAMIN D3) 125 MCG (5000 UT) CAPS Take 5,000 Units by mouth at bedtime.     . Coenzyme Q10 10 MG capsule Take 10 mg by mouth at bedtime.    Marland Kitchen dexlansoprazole (DEXILANT) 60 MG capsule Take 60 mg by mouth daily.    . flecainide (TAMBOCOR) 100 MG tablet Take 1 tablet (100 mg total) by mouth 2  (two) times daily. Please make overdue appt with Dr. Curt Bears before anymore refills. 1st attempt 60 tablet 0  . fluticasone (FLONASE) 50 MCG/ACT nasal spray Place 1 spray into both nostrils daily as needed for allergies.     . furosemide (LASIX) 20 MG tablet TAKE 1 TABLET(20 MG) BY MOUTH DAILY 90 tablet 0  . Krill Oil 500 MG CAPS Take 500 mg by mouth at bedtime.    . lidocaine-prilocaine (EMLA) cream Apply to affected area once 30 g 3  . LORazepam (ATIVAN) 0.5 MG tablet Take 1 tablet (0.5 mg total) by mouth at bedtime as needed (Nausea or vomiting). 30 tablet 0  . montelukast (SINGULAIR) 10 MG tablet Take 10 mg by mouth daily.    . Multiple Vitamin (MULTIVITAMIN) tablet Take 1 tablet by mouth at bedtime.     . ondansetron (ZOFRAN) 8 MG tablet Take 1 tablet (8 mg total) by mouth 2 (two) times daily as needed (Nausea or vomiting). 30 tablet 1  . Polyethyl Glycol-Propyl Glycol (SYSTANE OP) Place 1 drop into both eyes 2 (two) times daily as needed (dry eyes).    . prochlorperazine (COMPAZINE) 10 MG tablet TAKE 1 TABLET(10 MG) BY MOUTH EVERY 6 HOURS AS NEEDED FOR NAUSEA OR VOMITING (Patient taking differently: Take 10 mg by mouth every 6 (six) hours as needed for nausea or vomiting. ) 385 tablet 1  . quinapril (ACCUPRIL) 20 MG tablet TAKE 1 TABLET(20 MG) BY MOUTH EVERY MORNING 90 tablet 0  . traMADol (ULTRAM) 50 MG tablet Take 1 tablet (50 mg total) by mouth every 6 (six) hours as needed. (Patient taking differently: Take 50 mg by mouth every 6 (six) hours as needed for moderate pain. ) 10 tablet 0  . traMADol (ULTRAM) 50 MG tablet Take 2 tablets (100 mg total) by mouth every 6 (six) hours as needed. 12 tablet 0   No current facility-administered medications for this visit.    Facility-Administered Medications Ordered in Other Visits  Medication Dose Route Frequency Provider Last Rate Last Dose  . sodium chloride flush (NS) 0.9 % injection 10 mL  10 mL Intracatheter Once Nicholas Lose, MD         PHYSICAL EXAMINATION: ECOG PERFORMANCE STATUS: 1 - Symptomatic but completely ambulatory  Vitals:   12/11/18 1407  BP: 131/80  Pulse: 68  Resp: 16  Temp: 98.5 F (36.9 C)  SpO2: 98%   Filed Weights   12/11/18 1407  Weight: 164 lb 12.8 oz (74.8 kg)    GENERAL: alert, no distress and comfortable SKIN: skin color, texture, turgor are normal, no rashes or significant lesions EYES: normal, Conjunctiva are pink and non-injected, sclera clear OROPHARYNX: no exudate, no erythema and lips, buccal mucosa, and tongue normal  NECK: supple, thyroid normal size, non-tender, without nodularity LYMPH: no palpable lymphadenopathy in the cervical, axillary or inguinal LUNGS: clear to auscultation and percussion with normal breathing effort HEART: regular rate & rhythm and no murmurs and no lower extremity edema ABDOMEN: abdomen soft, non-tender and normal bowel sounds MUSCULOSKELETAL: no cyanosis of digits and no clubbing  NEURO: alert & oriented x 3 with  fluent speech, no focal motor/sensory deficits EXTREMITIES: No lower extremity edema  LABORATORY DATA:  I have reviewed the data as listed CMP Latest Ref Rng & Units 12/11/2018 11/30/2018 11/20/2018  Glucose 70 - 99 mg/dL 161(H) 173(H) 177(H)  BUN 8 - 23 mg/dL '11 9 13  ' Creatinine 0.44 - 1.00 mg/dL 0.73 0.71 0.71  Sodium 135 - 145 mmol/L 141 138 140  Potassium 3.5 - 5.1 mmol/L 4.0 4.1 4.2  Chloride 98 - 111 mmol/L 106 105 106  CO2 22 - 32 mmol/L '24 24 26  ' Calcium 8.9 - 10.3 mg/dL 8.9 9.4 8.8(L)  Total Protein 6.5 - 8.1 g/dL 6.2(L) - 5.9(L)  Total Bilirubin 0.3 - 1.2 mg/dL 0.9 - 0.8  Alkaline Phos 38 - 126 U/L 106 - 80  AST 15 - 41 U/L 28 - 22  ALT 0 - 44 U/L 46(H) - 54(H)    Lab Results  Component Value Date   WBC 5.4 12/11/2018   HGB 12.8 12/11/2018   HCT 37.3 12/11/2018   MCV 103.3 (H) 12/11/2018   PLT 166 12/11/2018   NEUTROABS 3.2 12/11/2018    ASSESSMENT & PLAN:  Malignant neoplasm of upper-inner quadrant of left  breast in female, estrogen receptor negative ([place]) 08/17/2018:Screening mammogram detected abnormality in the left breast by ultrasound irregular mass 10:00 position 1.3 cm, 10 o'clock position no enlarged lymph nodes, ER 0%, PR 0%, Ki-67 80%, HER-2 +3+ by IHC, grade 3, lymphovascular invasion present, T1c N0 stage Ia clinical stage  12/05/2018: Left lumpectomy: No residual invasive carcinoma, margins negative, 0/2 lymph nodes negative, complete pathologic response [id number]  Pathology counseling: I discussed the final pathology report of the patient provided  a copy of this report. I discussed the margins as well as lymph node surgeries. We also discussed the final staging along with previously performed ER/PR and HER-2/neu testing.  Treatment plan: Herceptin maintenance to complete 1 year of therapy. We will obtain radiation oncology consultation for adjuvant radiation therapy.  Return to clinic every 3 weeks for Herceptin every 6 weeks for follow-up with me with labs  No orders of the defined types were placed in this encounter.  The patient has a good understanding of the overall plan. she agrees with it. she will call with any problems that may develop before the next visit here.  Nicholas Lose, MD 12/11/2018  Julious Oka Dorshimer am acting as scribe for Dr. Nicholas Lose.  I have reviewed the above documentation for accuracy and completeness, and I agree with the above.

## 2018-12-11 ENCOUNTER — Inpatient Hospital Stay (HOSPITAL_BASED_OUTPATIENT_CLINIC_OR_DEPARTMENT_OTHER): Payer: PRIVATE HEALTH INSURANCE | Admitting: Hematology and Oncology

## 2018-12-11 ENCOUNTER — Encounter: Payer: Self-pay | Admitting: *Deleted

## 2018-12-11 ENCOUNTER — Other Ambulatory Visit: Payer: Self-pay | Admitting: *Deleted

## 2018-12-11 ENCOUNTER — Other Ambulatory Visit (HOSPITAL_COMMUNITY): Payer: PRIVATE HEALTH INSURANCE

## 2018-12-11 ENCOUNTER — Ambulatory Visit (HOSPITAL_COMMUNITY)
Admission: RE | Admit: 2018-12-11 | Discharge: 2018-12-11 | Disposition: A | Discharge status: Home / Self Care | Payer: PRIVATE HEALTH INSURANCE | Source: Ambulatory Visit | Attending: Internal Medicine | Admitting: Internal Medicine

## 2018-12-11 ENCOUNTER — Ambulatory Visit: Payer: PRIVATE HEALTH INSURANCE

## 2018-12-11 ENCOUNTER — Encounter (HOSPITAL_COMMUNITY): Payer: Self-pay | Admitting: Internal Medicine

## 2018-12-11 ENCOUNTER — Inpatient Hospital Stay: Payer: PRIVATE HEALTH INSURANCE

## 2018-12-11 ENCOUNTER — Other Ambulatory Visit: Payer: Self-pay

## 2018-12-11 VITALS — BP 126/80 | HR 60 | Wt 164.4 lb

## 2018-12-11 DIAGNOSIS — C50212 Malignant neoplasm of upper-inner quadrant of left female breast: Secondary | ICD-10-CM

## 2018-12-11 DIAGNOSIS — K219 Gastro-esophageal reflux disease without esophagitis: Secondary | ICD-10-CM | POA: Diagnosis not present

## 2018-12-11 DIAGNOSIS — Z885 Allergy status to narcotic agent status: Secondary | ICD-10-CM | POA: Diagnosis not present

## 2018-12-11 DIAGNOSIS — I10 Essential (primary) hypertension: Secondary | ICD-10-CM | POA: Diagnosis not present

## 2018-12-11 DIAGNOSIS — Z79899 Other long term (current) drug therapy: Secondary | ICD-10-CM | POA: Diagnosis not present

## 2018-12-11 DIAGNOSIS — C50411 Malignant neoplasm of upper-outer quadrant of right female breast: Secondary | ICD-10-CM

## 2018-12-11 DIAGNOSIS — H409 Unspecified glaucoma: Secondary | ICD-10-CM | POA: Diagnosis not present

## 2018-12-11 DIAGNOSIS — E785 Hyperlipidemia, unspecified: Secondary | ICD-10-CM | POA: Diagnosis not present

## 2018-12-11 DIAGNOSIS — Z5111 Encounter for antineoplastic chemotherapy: Secondary | ICD-10-CM | POA: Diagnosis not present

## 2018-12-11 DIAGNOSIS — Z87891 Personal history of nicotine dependence: Secondary | ICD-10-CM | POA: Insufficient documentation

## 2018-12-11 DIAGNOSIS — Z888 Allergy status to other drugs, medicaments and biological substances status: Secondary | ICD-10-CM | POA: Diagnosis not present

## 2018-12-11 DIAGNOSIS — Z171 Estrogen receptor negative status [ER-]: Secondary | ICD-10-CM

## 2018-12-11 DIAGNOSIS — Z880 Allergy status to penicillin: Secondary | ICD-10-CM | POA: Diagnosis not present

## 2018-12-11 DIAGNOSIS — Z8249 Family history of ischemic heart disease and other diseases of the circulatory system: Secondary | ICD-10-CM | POA: Diagnosis not present

## 2018-12-11 DIAGNOSIS — Z884 Allergy status to anesthetic agent status: Secondary | ICD-10-CM | POA: Insufficient documentation

## 2018-12-11 DIAGNOSIS — Z95828 Presence of other vascular implants and grafts: Secondary | ICD-10-CM

## 2018-12-11 LAB — CMP (CANCER CENTER ONLY)
ALT: 46 U/L — ABNORMAL HIGH (ref 0–44)
AST: 28 U/L (ref 15–41)
Albumin: 3.5 g/dL (ref 3.5–5.0)
Alkaline Phosphatase: 106 U/L (ref 38–126)
Anion gap: 11 (ref 5–15)
BUN: 11 mg/dL (ref 8–23)
CO2: 24 mmol/L (ref 22–32)
Calcium: 8.9 mg/dL (ref 8.9–10.3)
Chloride: 106 mmol/L (ref 98–111)
Creatinine: 0.73 mg/dL (ref 0.44–1.00)
GFR, Est AFR Am: >60 mL/min (ref >60)
GFR, Estimated: >60 mL/min (ref >60)
GLUCOSE: 161 mg/dL — AB (ref 70–99)
Potassium: 4 mmol/L (ref 3.5–5.1)
Sodium: 141 mmol/L (ref 135–145)
Total Bilirubin: 0.9 mg/dL (ref 0.3–1.2)
Total Protein: 6.2 g/dL — ABNORMAL LOW (ref 6.5–8.1)

## 2018-12-11 LAB — CBC WITH DIFFERENTIAL (CANCER CENTER ONLY)
Abs Immature Granulocytes: 0.07 10*3/uL (ref 0.00–0.07)
Basophils Absolute: 0 10*3/uL (ref 0.0–0.1)
Basophils Relative: 0 %
Eosinophils Absolute: 0.2 10*3/uL (ref 0.0–0.5)
Eosinophils Relative: 4 %
HCT: 37.3 % (ref 36.0–46.0)
Hemoglobin: 12.8 g/dL (ref 12.0–15.0)
Immature Granulocytes: 1 %
LYMPHS PCT: 28 %
Lymphs Abs: 1.5 10*3/uL (ref 0.7–4.0)
MCH: 35.5 pg — ABNORMAL HIGH (ref 26.0–34.0)
MCHC: 34.3 g/dL (ref 30.0–36.0)
MCV: 103.3 fL — AB (ref 80.0–100.0)
MONOS PCT: 8 %
Monocytes Absolute: 0.4 10*3/uL (ref 0.1–1.0)
Neutro Abs: 3.2 10*3/uL (ref 1.7–7.7)
Neutrophils Relative %: 59 %
Platelet Count: 166 10*3/uL (ref 150–400)
RBC: 3.61 MIL/uL — ABNORMAL LOW (ref 3.87–5.11)
RDW: 13.7 % (ref 11.5–15.5)
WBC Count: 5.4 10*3/uL (ref 4.0–10.5)
nRBC: 0 % (ref 0.0–0.2)

## 2018-12-11 MED ORDER — DIPHENHYDRAMINE HCL 25 MG PO CAPS
ORAL_CAPSULE | ORAL | Status: AC
  Filled 2018-12-11: qty 1

## 2018-12-11 MED ORDER — SODIUM CHLORIDE 0.9% FLUSH
10.0000 mL | Freq: Once | INTRAVENOUS | Status: AC
  Administered 2018-12-11: 10 mL
  Filled 2018-12-11: qty 10

## 2018-12-11 MED ORDER — DIPHENHYDRAMINE HCL 25 MG PO CAPS
25.0000 mg | ORAL_CAPSULE | Freq: Once | ORAL | Status: AC
  Administered 2018-12-11: 25 mg via ORAL

## 2018-12-11 MED ORDER — TRASTUZUMAB CHEMO 150 MG IV SOLR
450.0000 mg | Freq: Once | INTRAVENOUS | Status: AC
  Administered 2018-12-11: 450 mg via INTRAVENOUS
  Filled 2018-12-11: qty 21.43

## 2018-12-11 MED ORDER — SODIUM CHLORIDE 0.9 % IV SOLN
Freq: Once | INTRAVENOUS | Status: AC
  Administered 2018-12-11: 15:00:00 via INTRAVENOUS
  Filled 2018-12-11: qty 250

## 2018-12-11 MED ORDER — HEPARIN SOD (PORK) LOCK FLUSH 100 UNIT/ML IV SOLN
500.0000 [IU] | Freq: Once | INTRAVENOUS | Status: AC | PRN
  Administered 2018-12-11: 500 [IU]
  Filled 2018-12-11: qty 5

## 2018-12-11 MED ORDER — SODIUM CHLORIDE 0.9% FLUSH
10.0000 mL | INTRAVENOUS | Status: DC | PRN
  Administered 2018-12-11: 10 mL
  Filled 2018-12-11: qty 10

## 2018-12-11 MED ORDER — ACETAMINOPHEN 325 MG PO TABS
650.0000 mg | ORAL_TABLET | Freq: Once | ORAL | Status: AC
  Administered 2018-12-11: 650 mg via ORAL

## 2018-12-11 MED ORDER — ACETAMINOPHEN 325 MG PO TABS
ORAL_TABLET | ORAL | Status: AC
  Filled 2018-12-11: qty 2

## 2018-12-11 NOTE — Patient Instructions (Signed)
[facility] Discharge Instructions for Patients Receiving Chemotherapy  Today you received the following chemotherapy agents Herceptin  To help prevent nausea and vomiting after your treatment, we encourage you to take your nausea medication as directed   If you develop nausea and vomiting that is not controlled by your nausea medication, call the clinic.   BELOW ARE SYMPTOMS THAT SHOULD BE REPORTED IMMEDIATELY:  *FEVER GREATER THAN 100.5 F  *CHILLS WITH OR WITHOUT FEVER  NAUSEA AND VOMITING THAT IS NOT CONTROLLED WITH YOUR NAUSEA MEDICATION  *UNUSUAL SHORTNESS OF BREATH  *UNUSUAL BRUISING OR BLEEDING  TENDERNESS IN MOUTH AND THROAT WITH OR WITHOUT PRESENCE OF ULCERS  *URINARY PROBLEMS  *BOWEL PROBLEMS  UNUSUAL RASH Items with * indicate a potential emergency and should be followed up as soon as possible.  Feel free to call the clinic should you have any questions or concerns. The clinic phone number is [phone].  Please show the CHEMO ALERT CARD at check-in to the Emergency Department and triage nurse.   

## 2018-12-11 NOTE — Addendum Note (Signed)
Encounter addended by: Jolaine Artist, MD on: 12/11/2018 12:35 PM  Actions taken: Clinical Note Signed, LOS modified

## 2018-12-11 NOTE — Assessment & Plan Note (Signed)
08/17/2018:Screening mammogram detected abnormality in the left breast by ultrasound irregular mass 10:00 position 1.3 cm, 10 o'clock position no enlarged lymph nodes, ER 0%, PR 0%, Ki-67 80%, HER-2 +3+ by IHC, grade 3, lymphovascular invasion present, T1c N0 stage Ia clinical stage  12/05/2018: Left lumpectomy: No residual invasive carcinoma, margins negative, 0/2 lymph nodes negative, complete pathologic response [id number]  Pathology counseling: I discussed the final pathology report of the patient provided  a copy of this report. I discussed the margins as well as lymph node surgeries. We also discussed the final staging along with previously performed ER/PR and HER-2/neu testing.  Treatment plan: Herceptin maintenance to complete 1 year of therapy. Return to clinic every 3 weeks for Herceptin every 6 weeks for follow-up with me with labs

## 2018-12-11 NOTE — Patient Instructions (Signed)
Your physician has requested that you have an echocardiogram. Echocardiography is a painless test that uses sound waves to create images of your heart. It provides your doctor with information about the size and shape of your heart and how well your heart's chambers and valves are working. This procedure takes approximately one hour. There are no restrictions for this procedure.  Your physician recommends that you schedule a follow-up appointment in: 3 months with an ECHO

## 2018-12-11 NOTE — Addendum Note (Signed)
Encounter addended by: Valeda Malm, RN on: 12/11/2018 12:37 PM  Actions taken: Visit diagnoses modified, Order list changed, Diagnosis association updated, Clinical Note Signed

## 2018-12-11 NOTE — Consult Note (Addendum)
CARDIO-ONCOLOGY CLINIC CONSULT NOTE  Referring Physician: Primary Care: Primary Cardiologist:  HPI:  [name] is 73 y.o. female with left breast cancer referred by Dr. Lindi Clarke for enrollment into the Cardio-Oncology program.  Has h/o HTN and HL. Also h/o WCT followed by Dr. Curt Clarke on flecainide   Doing well. No CP or SOB. Has finished first 3 months of chemo and now herceptin alone every 3 weeks. Had lumpectomy 2/20 with Dr. Donne Clarke. Margins clear.   ECHO 60-65% GLS -22.5%   Reviewed personally.   Malignant neoplasm of upper-inner quadrant of left breast in female, estrogen receptor negative ([place])   08/17/2018 Initial Diagnosis    Screening mammogram detected abnormality in the left breast by ultrasound irregular mass 10:00 position 1.3 cm, 10 o'clock position no enlarged lymph nodes, ER 0%, PR 0%, Ki-67 80%, HER-2 +3+ by [facility]    08/28/2018 Cancer Staging    Staging form: Breast, AJCC 8th Edition - Clinical stage from 08/28/2018: Stage IA (cT1c, cN0, cM0, G3, ER-, PR-, HER2+) - Signed by Andrea Lose, MD on 08/28/2018    08/28/2018 Breast MRI    malignancy within the UPPER INNER LEFT breast with dominant mass measuring 2.1 cm. With small adjacent satellite nodules, the entire area 1.5 x 3 x 2.2 cm.No evidence of abnormal lymph nodes or RIGHT breast malignancy.    09/03/2018 -  Chemotherapy    Weekly Taxol and Herceptin followed by maintenance Herceptin for one year.     12/11/2018 -  Chemotherapy    The patient had trastuzumab (HERCEPTIN) 588 mg in sodium chloride 0.9 % 250 mL chemo infusion, 8 mg/kg = 588 mg, Intravenous,  Once, 0 of 13 cycles  for chemotherapy treatment.        Review of Systems: [y] = yes, '[ ]'  = no   General: Weight gain '[ ]' ; Weight loss '[ ]' ; Anorexia '[ ]' ; Fatigue '[ ]' ; Fever '[ ]' ; Chills '[ ]' ; Weakness '[ ]'   Cardiac: Chest pain/pressure '[ ]' ; Resting SOB '[ ]' ; Exertional SOB '[ ]' ; Orthopnea '[ ]' ; Pedal Edema '[ ]' ; Palpitations '[ ]' ;  Syncope '[ ]' ; Presyncope '[ ]' ; Paroxysmal nocturnal dyspnea'[ ]'   Pulmonary: Cough '[ ]' ; Wheezing'[ ]' ; Hemoptysis'[ ]' ; Sputum '[ ]' ; Snoring '[ ]'   GI: Vomiting'[ ]' ; Dysphagia'[ ]' ; Melena'[ ]' ; Hematochezia '[ ]' ; Heartburn'[ ]' ; Abdominal pain '[ ]' ; Constipation '[ ]' ; Diarrhea '[ ]' ; BRBPR '[ ]'   GU: Hematuria'[ ]' ; Dysuria '[ ]' ; Nocturia'[ ]'   Vascular: Pain in legs with walking '[ ]' ; Pain in feet with lying flat '[ ]' ; Non-healing sores '[ ]' ; Stroke '[ ]' ; TIA '[ ]' ; Slurred speech '[ ]' ;  Neuro: Headaches'[ ]' ; Vertigo'[ ]' ; Seizures'[ ]' ; Paresthesias'[ ]' ;Blurred vision '[ ]' ; Diplopia '[ ]' ; Vision changes '[ ]'   Ortho/Skin: Arthritis Blue.Reese ]; Joint pain [ y]; Muscle pain '[ ]' ; Joint swelling '[ ]' ; Back Pain '[ ]' ; Rash '[ ]'   Psych: Depression'[ ]' ; Anxiety'[ ]'   Heme: Bleeding problems '[ ]' ; Clotting disorders '[ ]' ; Anemia '[ ]'   Endocrine: Diabetes '[ ]' ; Thyroid dysfunction'[ ]'    Past Medical History:  Diagnosis Date  . Atrial tachycardia ([place])   . Breast cancer ([place])    left breast cancer 2019  . 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 Allegra Clarke)  . GERD (gastroesophageal reflux disease)   . Glaucoma   . Hyperlipidemia   . Hypertension   . PONV (postoperative nausea and vomiting)     Current Outpatient Medications  Medication Sig Dispense Refill  . acetaminophen (TYLENOL) 500 MG tablet Take 1,000 mg by mouth daily as needed for moderate pain or headache.     Marland Kitchen atorvastatin (LIPITOR) 20 MG tablet Take 20 mg by mouth daily.    . brimonidine-timolol (COMBIGAN) 0.2-0.5 % ophthalmic solution Place 1 drop into both eyes every 12 (twelve) hours.    Marland Kitchen CARTIA XT 120 MG 24 hr capsule TAKE ONE CAPSULE BY MOUTH DAILY, STOPPING METOPROLOL 90 capsule 3  . cetirizine (ZYRTEC) 10 MG tablet Take 10 mg by mouth daily.    . Cholecalciferol (VITAMIN D3) 125 MCG (5000 UT) CAPS Take 5,000 Units by mouth at bedtime.     . Coenzyme Q10 10 MG capsule Take 10 mg by mouth at bedtime.    Marland Kitchen dexlansoprazole (DEXILANT) 60 MG capsule Take  60 mg by mouth daily.    . flecainide (TAMBOCOR) 100 MG tablet Take 1 tablet (100 mg total) by mouth 2 (two) times daily. Please make overdue appt with Dr. Curt Clarke before anymore refills. 1st attempt 60 tablet 0  . fluticasone (FLONASE) 50 MCG/ACT nasal spray Place 1 spray into both nostrils daily as needed for allergies.     . furosemide (LASIX) 20 MG tablet TAKE 1 TABLET(20 MG) BY MOUTH DAILY 90 tablet 0  . Krill Oil 500 MG CAPS Take 500 mg by mouth at bedtime.    . lidocaine-prilocaine (EMLA) cream Apply to affected area once 30 g 3  . LORazepam (ATIVAN) 0.5 MG tablet Take 1 tablet (0.5 mg total) by mouth at bedtime as needed (Nausea or vomiting). 30 tablet 0  . montelukast (SINGULAIR) 10 MG tablet Take 10 mg by mouth daily.    . Multiple Vitamin (MULTIVITAMIN) tablet Take 1 tablet by mouth at bedtime.     . ondansetron (ZOFRAN) 8 MG tablet Take 1 tablet (8 mg total) by mouth 2 (two) times daily as needed (Nausea or vomiting). 30 tablet 1  . Polyethyl Glycol-Propyl Glycol (SYSTANE OP) Place 1 drop into both eyes 2 (two) times daily as needed (dry eyes).    . prochlorperazine (COMPAZINE) 10 MG tablet TAKE 1 TABLET(10 MG) BY MOUTH EVERY 6 HOURS AS NEEDED FOR NAUSEA OR VOMITING (Patient taking differently: Take 10 mg by mouth every 6 (six) hours as needed for nausea or vomiting. ) 385 tablet 1  . quinapril (ACCUPRIL) 20 MG tablet TAKE 1 TABLET(20 MG) BY MOUTH EVERY MORNING 90 tablet 0  . traMADol (ULTRAM) 50 MG tablet Take 1 tablet (50 mg total) by mouth every 6 (six) hours as needed. (Patient taking differently: Take 50 mg by mouth every 6 (six) hours as needed for moderate pain. ) 10 tablet 0  . traMADol (ULTRAM) 50 MG tablet Take 2 tablets (100 mg total) by mouth every 6 (six) hours as needed. 12 tablet 0   No current facility-administered medications for this encounter.    Facility-Administered Medications Ordered in Other Encounters  Medication Dose Route Frequency Provider Last Rate Last  Dose  . sodium chloride flush (NS) 0.9 % injection 10 mL  10 mL Intracatheter Once Andrea Lose, MD        Allergies  Allergen Reactions  . Adhesive [Tape] Other (See Comments)    Pulls skin off  . Oxycodone Hcl Hives  . Augmentin [Amoxicillin-Pot Clavulanate] Diarrhea    Has patient had a PCN reaction causing immediate rash, facial/tongue/throat swelling, SOB or lightheadedness with hypotension: No Has patient had a PCN reaction causing severe rash involving mucus membranes  or skin necrosis: No Has patient had a PCN reaction that required hospitalization: No Has patient had a PCN reaction occurring within the last 10 years: Yes If all of the above answers are "NO", then may proceed with Cephalosporin use.   Marland Kitchen Xylocaine [Lidocaine Hcl] Palpitations    For Dental Procedures       Social History   Socioeconomic History  . Marital status: Married    Spouse name: Not on file  . Number of children: Not on file  . Years of education: Not on file  . Highest education level: Not on file  Occupational History  . Not on file  Social Needs  . Financial resource strain: Not on file  . Food insecurity:    Worry: Not on file    Inability: Not on file  . Transportation needs:    Medical: Not on file    Non-medical: Not on file  Tobacco Use  . Smoking status: Former Research scientist (life sciences)  . Smokeless tobacco: Never Used  Substance and Sexual Activity  . Alcohol use: Yes    Comment: occasional- Have not had since 08/2018  . Drug use: No  . Sexual activity: Not on file  Lifestyle  . Physical activity:    Days per week: Not on file    Minutes per session: Not on file  . Stress: Not on file  Relationships  . Social connections:    Talks on phone: Not on file    Gets together: Not on file    Attends religious service: Not on file    Active member of club or organization: Not on file    Attends meetings of clubs or organizations: Not on file    Relationship status: Not on file  . Intimate  partner violence:    Fear of current or ex partner: Not on file    Emotionally abused: Not on file    Physically abused: Not on file    Forced sexual activity: Not on file  Other Topics Concern  . Not on file  Social History Narrative  . Not on file      Family History  Problem Relation Age of Onset  . Hypertension Mother   . Breast cancer Neg Hx     Vitals:   12/11/18 1200  BP: 126/80  Pulse: 60  SpO2: 97%  Weight: 74.6 kg (164 lb 6 oz)    PHYSICAL EXAM: General:  Well appearing. No respiratory difficulty HEENT: normal Neck: supple. no JVD. Carotids 2+ bilat; no bruits. No lymphadenopathy or thryomegaly appreciated. Cor: PMI nondisplaced. Regular rate & rhythm. No rubs, gallops or murmurs. Port-a-cath Lungs: clear Abdomen: soft, nontender, nondistended. No hepatosplenomegaly. No bruits or masses. Good bowel sounds. Extremities: no cyanosis, clubbing, rash, edema Neuro: alert & oriented x 3, cranial nerves grossly intact. moves all 4 extremities w/o difficulty. Affect pleasant.   ASSESSMENT & PLAN: 1. Left Breast Cancer -08/17/2018:Screening mammogram detected abnormality in the left breast by ultrasound irregular mass 10:00 position 1.3 cm, 10 o'clock position no enlarged lymph nodes, ER 0%, PR 0%, Ki-67 80%, HER-2 +3+ by IHC, grade 3, lymphovascular invasion present, T1c N0 stage Ia clinical stage -Plan for neo-adjuvant chemotherapy with Herceptin followed by Herceptin maintenance for 1 year & breast conserving surgery with sentinel lymph node biopsy - Explained incidence of Herceptin cardiotoxicity and role of Cardio-oncology clinic at length. Echo images reviewed personally. All parameters stable. Reviewed signs and symptoms of HF to look for. Continue Herceptin. Follow-up with echo in  3 months.  2. HTN - Blood pressure well controlled. Continue current regimen.  Glori Bickers, MD  12:16 PM

## 2018-12-11 NOTE — Progress Notes (Signed)
Error

## 2018-12-17 ENCOUNTER — Telehealth: Payer: Self-pay

## 2018-12-17 ENCOUNTER — Telehealth: Payer: Self-pay | Admitting: *Deleted

## 2018-12-17 NOTE — Progress Notes (Addendum)
Radiation Oncology         [phone] ________________________________  Name: Andrea Clarke        MRN: [id number]  Date of Service: 12/20/2018 DOB: 09/25/1946  IN:[facility]VE[facility], Valaria Good., MD  Nicholas Lose, MD     REFERRING PHYSICIAN: Nicholas Lose, MD   DIAGNOSIS: The encounter diagnosis was Malignant neoplasm of upper-inner quadrant of left breast in female, estrogen receptor negative ([place]).   HISTORY OF PRESENT ILLNESS: Andrea Clarke is a 73 y.o. female seen  for a new diagnosis of left breast cancer. The patient was noted to have a mass found on screening mammogram on 08/07/2018  in the left breast.  Diagnostic imaging on 08/15/2018 revealed a mass in the left breast at 2-3 o'clock measuring 14 mm.  Ultrasound confirmed this measurement is 13 x 8 x 11 mm, and her axilla was negative for adenopathy she underwent a biopsy on 08/17/2018 which revealed a invasive ductal carcinoma with lymphovascular invasion noted.  It was a grade 3, and her tumor was ER/PR negative, HER-2 amplified with AKI 67 of 80% she underwent MRI of the breast on 08/28/2018 and this revealed a 15 x 20 x 21 mm mass in the upper inner left breast consistent with the biopsy-proven malignancy, there were adjacent satellite nodules measuring up to 4 mm, and no abnormal appearing lymph nodes.  She proceeded with neoadjuvant chemotherapy, and it appears that she began her regimen on 09/04/2018, and finished cycle 3 on 11/20/2018.  She continues with Herceptin, and subsequently her MRI on 11/22/2018 revealed treatment response with mild residual nodular enhancement measuring 23 x 19 x 18 mm.  She underwent lumpectomy with sentinel lymph node biopsy on 12/05/2018.  Her lumpectomy site revealed no residual invasive carcinoma, her margins were negative, and there was treatment effect seen in the specimen.  2 lymph nodes were removed which were negative for carcinoma, and additional margins were taken superiorly, laterally and medially and all  revealed fibrocystic change.  She has had a difficult time with an axillary seroma. It was drained last week, yesterday, and she is going back to see Dr. Donne Hazel tomorrow to consider having this drained again. She comes today to discuss adjuvant radiotherapy.   PREVIOUS RADIATION THERAPY: No   PAST MEDICAL HISTORY:  Past Medical History:  Diagnosis Date  . Atrial tachycardia ([place])   . Breast cancer ([place])    left breast cancer 2019  . 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 Allegra Lai)  . GERD (gastroesophageal reflux disease)   . Glaucoma   . Hyperlipidemia   . Hypertension   . PONV (postoperative nausea and vomiting)        PAST SURGICAL HISTORY: Past Surgical History:  Procedure Laterality Date  . ABDOMINAL HYSTERECTOMY    . ANKLE SURGERY    . APPENDECTOMY    . BREAST BIOPSY     Left  . BREAST LUMPECTOMY WITH RADIOACTIVE SEED AND SENTINEL LYMPH NODE BIOPSY N/A 12/05/2018   Procedure: LEFT BREAST LUMPECTOMY WITH RADIOACTIVE SEED AND LEFT AXILLARY SENTINEL LYMPH NODE BIOPSY AND BLUE DYE INJECTION;  Surgeon: Rolm Bookbinder, MD;  Location: [facility];  Service: General;  Laterality: N/A;  . CATARACT EXTRACTION W/ INTRAOCULAR LENS IMPLANT Bilateral   . PORTACATH PLACEMENT Right 09/03/2018   Procedure: INSERTION PORT-A-CATH WITH [place];  Surgeon: Rolm Bookbinder, MD;  Location: [facility];  Service: General;  Laterality: Right;  . ROTATOR CUFF REPAIR Right   . TONSILLECTOMY  FAMILY HISTORY:  Family History  Problem Relation Age of Onset  . Hypertension Mother   . Breast cancer Neg Hx      SOCIAL HISTORY:  reports that she has quit smoking. She has never used smokeless tobacco. She reports current alcohol use. She reports that she does not use drugs. The patient is married and lives in [place]. She works as a Passenger transport manager for [employer]: Adhesive [tape]; Oxycodone hcl; Augmentin [amoxicillin-pot clavulanate]; and  Xylocaine [lidocaine hcl]   MEDICATIONS:  Current Outpatient Medications  Medication Sig Dispense Refill  . acetaminophen (TYLENOL) 500 MG tablet Take 1,000 mg by mouth daily as needed for moderate pain or headache.     Marland Kitchen atorvastatin (LIPITOR) 20 MG tablet Take 20 mg by mouth daily.    . brimonidine-timolol (COMBIGAN) 0.2-0.5 % ophthalmic solution Place 1 drop into both eyes every 12 (twelve) hours.    Marland Kitchen CARTIA XT 120 MG 24 hr capsule TAKE ONE CAPSULE BY MOUTH DAILY, STOPPING METOPROLOL 90 capsule 3  . cetirizine (ZYRTEC) 10 MG tablet Take 10 mg by mouth daily.    . Cholecalciferol (VITAMIN D3) 125 MCG (5000 UT) CAPS Take 5,000 Units by mouth at bedtime.     . Coenzyme Q10 10 MG capsule Take 10 mg by mouth at bedtime.    Marland Kitchen dexlansoprazole (DEXILANT) 60 MG capsule Take 60 mg by mouth daily.    . flecainide (TAMBOCOR) 100 MG tablet Take 1 tablet (100 mg total) by mouth 2 (two) times daily. Please make overdue appt with Dr. Curt Bears before anymore refills. 1st attempt 60 tablet 0  . fluticasone (FLONASE) 50 MCG/ACT nasal spray Place 1 spray into both nostrils daily as needed for allergies.     . furosemide (LASIX) 20 MG tablet TAKE 1 TABLET(20 MG) BY MOUTH DAILY 90 tablet 0  . Krill Oil 500 MG CAPS Take 500 mg by mouth at bedtime.    . lidocaine-prilocaine (EMLA) cream Apply to affected area once 30 g 3  . LORazepam (ATIVAN) 0.5 MG tablet Take 1 tablet (0.5 mg total) by mouth at bedtime as needed (Nausea or vomiting). 30 tablet 0  . montelukast (SINGULAIR) 10 MG tablet Take 10 mg by mouth daily.    . Multiple Vitamin (MULTIVITAMIN) tablet Take 1 tablet by mouth at bedtime.     . ondansetron (ZOFRAN) 8 MG tablet Take 1 tablet (8 mg total) by mouth 2 (two) times daily as needed (Nausea or vomiting). 30 tablet 1  . Polyethyl Glycol-Propyl Glycol (SYSTANE OP) Place 1 drop into both eyes 2 (two) times daily as needed (dry eyes).    . prochlorperazine (COMPAZINE) 10 MG tablet TAKE 1 TABLET(10 MG) BY  MOUTH EVERY 6 HOURS AS NEEDED FOR NAUSEA OR VOMITING (Patient taking differently: Take 10 mg by mouth every 6 (six) hours as needed for nausea or vomiting. ) 385 tablet 1  . quinapril (ACCUPRIL) 20 MG tablet TAKE 1 TABLET(20 MG) BY MOUTH EVERY MORNING 90 tablet 0  . traMADol (ULTRAM) 50 MG tablet Take 1 tablet (50 mg total) by mouth every 6 (six) hours as needed. (Patient taking differently: Take 50 mg by mouth every 6 (six) hours as needed for moderate pain. ) 10 tablet 0  . traMADol (ULTRAM) 50 MG tablet Take 2 tablets (100 mg total) by mouth every 6 (six) hours as needed. 12 tablet 0   No current facility-administered medications for this visit.    Facility-Administered Medications Ordered in Other Visits  Medication  Dose Route Frequency Provider Last Rate Last Dose  . sodium chloride flush (NS) 0.9 % injection 10 mL  10 mL Intracatheter Once Nicholas Lose, MD         REVIEW OF SYSTEMS: On review of systems, the patient reports that she is doing well overall. She does have some tenderness along her left axilla. She denies any redness or purulent drainage. She denies any chest pain, shortness of breath, cough, fevers, chills, night sweats, unintended weight changes. She denies any bowel or bladder disturbances, and denies abdominal pain, nausea or vomiting. She denies any new musculoskeletal or joint aches or pains. A complete review of systems is obtained and is otherwise negative.  PHYSICAL EXAM:  Wt Readings from Last 3 Encounters:  12/11/18 164 lb 6 oz (74.6 kg)  12/11/18 164 lb 12.8 oz (74.8 kg)  12/05/18 163 lb (73.9 kg)   Temp Readings from Last 3 Encounters:  12/11/18 98.5 F (36.9 C) (Oral)  12/05/18 (!) 97 F (36.1 C)  11/30/18 97.7 F (36.5 C)   BP Readings from Last 3 Encounters:  12/11/18 126/80  12/11/18 131/80  12/05/18 130/73   Pulse Readings from Last 3 Encounters:  12/11/18 60  12/11/18 68  12/05/18 62     In general this is a well appearing caucasian   female in no acute distress. She is alert and oriented x4 and appropriate throughout the examination. HEENT reveals that the patient is normocephalic, atraumatic. EOMs are intact. Skin is intact without any evidence of gross lesions. Cardiopulmonary assessment is negative for acute distress and she exhibits normal effort.  The left breast incision site is intact without erythema or fluctuance, however her left axilla superior to the incision site does have about 5 cm of fullness consistent with seroma. No erythema is notes.   ECOG = 1  0 - Asymptomatic (Fully active, able to carry on all predisease activities without restriction)  1 - Symptomatic but completely ambulatory (Restricted in physically strenuous activity but ambulatory and able to carry out work of a light or sedentary nature. For example, light housework, office work)  2 - Symptomatic, <50% in bed during the day (Ambulatory and capable of all self care but unable to carry out any work activities. Up and about more than 50% of waking hours)  3 - Symptomatic, >50% in bed, but not bedbound (Capable of only limited self-care, confined to bed or chair 50% or more of waking hours)  4 - Bedbound (Completely disabled. Cannot carry on any self-care. Totally confined to bed or chair)  5 - Death   Eustace Pen MM, Creech RH, Tormey DC, et al. [id number]). "Toxicity and response criteria of the [facility]". [place] Oncol. 5 (6): 649-55    LABORATORY DATA:  Lab Results  Component Value Date   WBC 5.4 12/11/2018   HGB 12.8 12/11/2018   HCT 37.3 12/11/2018   MCV 103.3 (H) 12/11/2018   PLT 166 12/11/2018   Lab Results  Component Value Date   NA 141 12/11/2018   K 4.0 12/11/2018   CL 106 12/11/2018   CO2 24 12/11/2018   Lab Results  Component Value Date   ALT 46 (H) 12/11/2018   AST 28 12/11/2018   ALKPHOS 106 12/11/2018   BILITOT 0.9 12/11/2018      RADIOGRAPHY: Mr Breast Bilateral W Wo Contrast Inc  Cad  Result Date: 11/22/2018 CLINICAL DATA:  73 year old female with left breast cancer diagnosed November 2019 post neoadjuvant chemotherapy. Breast  MRI dated 08/28/2018 demonstrated the biopsy proven malignancy in the upper inner left breast with the dominant mass measuring 2.1 cm with surrounding small adjacent satellite nodules. Assess response to therapy. LABS:  Not applicable. EXAM: BILATERAL BREAST MRI WITH AND WITHOUT CONTRAST TECHNIQUE: Multiplanar, multisequence MR images of both breasts were obtained prior to and following the intravenous administration of 7 ml of Gadavist Three-dimensional MR images were rendered by post-processing of the original MR data on an independent workstation. The three-dimensional MR images were interpreted, and findings are reported in the following complete MRI report for this study. Three dimensional images were evaluated at the independent DynaCad workstation COMPARISON:  Previous exam(s). FINDINGS: Breast composition: b.  Scattered fibroglandular tissue. Background parenchymal enhancement: Mild Right breast: No suspicious enhancing masses or abnormal areas of enhancement in the right breast to suggest malignancy. Left breast: There is mild residual nodular enhancement the site of biopsy proven malignancy with the dominant mass no longer identified. This measures 2.3 cm transverse, 1.9 cm AP, and 1.8 cm craniocaudal. Some of this appearance may be related to post treatment fibrosis. No new enhancing masses seen. Lymph nodes: No abnormal appearing lymph nodes. Ancillary findings:  No morphologically abnormal lymph nodes. IMPRESSION: Treatment response with mild residual nodular enhancement at site of biopsy proven malignancy and dominant mass no longer identified in the upper slightly inner left breast. This residual enhancement measures approximately 2.3 x 1.9 x 1.8 cm. RECOMMENDATION: Treatment plan for known left breast malignancy. BI-RADS CATEGORY  6: Known  biopsy-proven malignancy. Electronically Signed   By: Everlean Alstrom M.D.   On: 11/22/2018 12:55   Nm Sentinel Node Inj-no Rpt (breast)  Result Date: 12/05/2018 Sulfur colloid was injected by the nuclear medicine technologist for melanoma sentinel node.   Mm Breast Surgical Specimen  Result Date: 12/05/2018 CLINICAL DATA:  Specimen radiograph status post left breast lumpectomy. EXAM: SPECIMEN RADIOGRAPH OF THE LEFT BREAST COMPARISON:  Previous exam(s). FINDINGS: Status post excision of the left breast. The radioactive seed and biopsy marker clip are present, completely intact, and were marked for pathology. These findings were communicated with the OR at 2:05 p.m. IMPRESSION: Specimen radiograph of the left breast. Electronically Signed   By: Ammie Ferrier M.D.   On: 12/05/2018 16:48   Mm Lt Radioactive Seed Loc Mammo Guide  Result Date: 12/04/2018 CLINICAL DATA:  Pre lumpectomy localization of an invasive mammary carcinoma in the 10 o'clock position of the left breast. EXAM: MAMMOGRAPHIC GUIDED RADIOACTIVE SEED LOCALIZATION OF THE LEFT BREAST COMPARISON:  Previous exam(s). FINDINGS: Patient presents for radioactive seed localization prior to left lumpectomy. I met with the patient and we discussed the procedure of seed localization including benefits and alternatives. We discussed the high likelihood of a successful procedure. We discussed the risks of the procedure including infection, bleeding, tissue injury and further surgery. We discussed the low dose of radioactivity involved in the procedure. Informed, written consent was given. The usual time-out protocol was performed immediately prior to the procedure. Using mammographic guidance, sterile technique, 1% lidocaine and an I-125 radioactive seed, the previously placed ribbon shaped biopsy marker clip in the 10 o'clock position of the left breast was localized using a medial approach. The follow-up mammogram images confirm the seed in the  expected location and were marked for Dr. Donne Hazel. Follow-up survey of the patient confirms presence of the radioactive seed. Order number of I-125 seed:  [id number]. Total activity:  0.252 mCi reference Date: 11/16/2018 The patient tolerated the procedure well and was  released from the [place]. She was given instructions regarding seed removal. IMPRESSION: Radioactive seed localization left breast. No apparent complications. Electronically Signed   By: Claudie Revering M.D.   On: 12/04/2018 13:25       IMPRESSION/PLAN: 1. 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 Dr. Lisbeth Renshaw discusses the pathology findings and reviews the nature of left breast disease. She has done well since chemotherapy has ended. She is still healing at this time but will soon be ready to proceed with adjuvant radiotherapy. We discussed the utility of this. We discussed the risks, benefits, short, and long term effects of radiotherapy, and the patient is interested in proceeding. Dr. Lisbeth Renshaw discusses the delivery and logistics of radiotherapy and anticipates a course of 4 weeks of radiotherapy. She will be contacted by our staff to coordinate simulation the week of 01/07/2019 and we would anticipate starting treatment the week of 01/14/2019 provide her seroma has been  2. Postoperative seroma. The patient will complete her healing prior to proceeding with radiotherapy. We anticipate her simulation the week of 01/07/2019. Her treatment should begin by 01/14/2019 as she is also trying to complete treatment in time for her to go on a vacation.  In a visit lasting 25 minutes, greater than 50% of the time was spent face to face discussing her, and coordinating the patient's care.  The above documentation reflects my direct findings during this shared patient visit. Please see the separate note by Dr. Lisbeth Renshaw on this date for the  remainder of the patient's plan of care.    Carola Rhine, PAC

## 2018-12-17 NOTE — Telephone Encounter (Signed)
"  Andrea Clarke [id number]).  Tring to reach Dr. Donne Hazel to have fluid drained again from lumpectomy site.  Area hurts and trouble movong, positioning left arm it's so swollen.  Unable to reach anyone in office."  [place] nurse unable to get through [place] and main office number is number for on call provider.

## 2018-12-17 NOTE — Telephone Encounter (Signed)
Nurse obtained direct number to the nurses at [facility].  Nurse spoke with staff regarding patient's concerns.  A nurse from [facility] will be contacting patient ASAP.   Nurse informed patient that they will be getting in contact with her.  Pt very thankful, no further needs at this time.

## 2018-12-19 ENCOUNTER — Other Ambulatory Visit: Payer: Self-pay | Admitting: Cardiology

## 2018-12-20 ENCOUNTER — Other Ambulatory Visit: Payer: Self-pay

## 2018-12-20 ENCOUNTER — Ambulatory Visit
Admission: RE | Admit: 2018-12-20 | Discharge: 2018-12-20 | Disposition: A | Discharge status: Home / Self Care | Payer: PRIVATE HEALTH INSURANCE | Source: Ambulatory Visit | Attending: Radiation Oncology | Admitting: Radiation Oncology

## 2018-12-20 ENCOUNTER — Encounter: Payer: Self-pay | Admitting: Radiation Oncology

## 2018-12-20 VITALS — BP 139/78 | HR 57 | Temp 97.9°F | Resp 20 | Ht 63.0 in | Wt 166.0 lb

## 2018-12-20 DIAGNOSIS — I471 Supraventricular tachycardia: Secondary | ICD-10-CM | POA: Diagnosis not present

## 2018-12-20 DIAGNOSIS — K219 Gastro-esophageal reflux disease without esophagitis: Secondary | ICD-10-CM | POA: Diagnosis not present

## 2018-12-20 DIAGNOSIS — M96843 Postprocedural seroma of a musculoskeletal structure following other procedure: Secondary | ICD-10-CM | POA: Insufficient documentation

## 2018-12-20 DIAGNOSIS — Z87891 Personal history of nicotine dependence: Secondary | ICD-10-CM | POA: Insufficient documentation

## 2018-12-20 DIAGNOSIS — E785 Hyperlipidemia, unspecified: Secondary | ICD-10-CM | POA: Diagnosis not present

## 2018-12-20 DIAGNOSIS — Z79899 Other long term (current) drug therapy: Secondary | ICD-10-CM | POA: Insufficient documentation

## 2018-12-20 DIAGNOSIS — Z171 Estrogen receptor negative status [ER-]: Principal | ICD-10-CM

## 2018-12-20 DIAGNOSIS — Z9221 Personal history of antineoplastic chemotherapy: Secondary | ICD-10-CM | POA: Diagnosis not present

## 2018-12-20 DIAGNOSIS — I10 Essential (primary) hypertension: Secondary | ICD-10-CM | POA: Insufficient documentation

## 2018-12-20 DIAGNOSIS — C50212 Malignant neoplasm of upper-inner quadrant of left female breast: Secondary | ICD-10-CM | POA: Diagnosis not present

## 2018-12-27 NOTE — Progress Notes (Signed)
Patient Care Team: [name], MD as PCP - General (Internal Medicine) Rolm Bookbinder, MD as Consulting Physician (General Surgery)  DIAGNOSIS:    ICD-10-CM   1. Malignant neoplasm of upper-inner quadrant of left breast in female, estrogen receptor negative ([place]) C50.212    Z17.1     SUMMARY OF ONCOLOGIC HISTORY:   Malignant neoplasm of upper-inner quadrant of left breast in female, estrogen receptor negative ([place])   08/17/2018 Initial Diagnosis    Screening mammogram detected abnormality in the left breast by ultrasound irregular mass 10:00 position 1.3 cm, 10 o'clock position no enlarged lymph nodes, ER 0%, PR 0%, Ki-67 80%, HER-2 +3+ by [employer]    08/28/2018 Cancer Staging    Staging form: Breast, AJCC 8th Edition - Clinical stage from 08/28/2018: Stage IA (cT1c, cN0, cM0, G3, ER-, PR-, HER2+) - Signed by Nicholas Lose, MD on 08/28/2018    08/28/2018 Breast MRI    malignancy within the UPPER INNER LEFT breast with dominant mass measuring 2.1 cm. With small adjacent satellite nodules, the entire area 1.5 x 3 x 2.2 cm.No evidence of abnormal lymph nodes or RIGHT breast malignancy.    09/03/2018 -  Chemotherapy    Weekly Taxol and Herceptin followed by maintenance Herceptin for one year.     12/05/2018 Surgery    Left lumpectomy: No residual invasive carcinoma, margins negative, 0/2 lymph nodes negative, complete pathologic response [id number]     CHIEF COMPLIANT: Follow-up of maintenance Herceptin  INTERVAL HISTORY: Andrea Clarke is a 73 y.o. with above-mentioned history of left breast cancer who received 12 cycles of Taxol with Herceptin and underwent a left lumpectomy. She presents to the clinic today with her husband for Herceptin maintenance, which she is tolerating well. She denies any diarrhea and notes good energy levels. She had a seroma in her left breast following surgery that needed to be drained several times but has improved significantly. Her labs from today  show: WBC 5.7, Hg 13.9, platelets 172, ANC 3.5. She will start radiation on 01/14/19.   REVIEW OF SYSTEMS:   Constitutional: Denies fevers, chills or abnormal weight loss Eyes: Denies blurriness of vision Ears, nose, mouth, throat, and face: Denies mucositis or sore throat Respiratory: Denies cough, dyspnea or wheezes Cardiovascular: Denies palpitation, chest discomfort Gastrointestinal: Denies nausea, heartburn or change in bowel habits Skin: Denies abnormal skin rashes Lymphatics: Denies new lymphadenopathy or easy bruising Neurological: Denies numbness, tingling or new weaknesses Behavioral/Psych: Mood is stable, no new changes  Extremities: No lower extremity edema Breast: denies any pain or lumps or nodules in either breasts (+) seroma in left breast All other systems were reviewed with the patient and are negative.  I have reviewed the past medical history, past surgical history, social history and family history with the patient and they are unchanged from previous note.  ALLERGIES:  is allergic to adhesive [tape]; oxycodone hcl; augmentin [amoxicillin-pot clavulanate]; and xylocaine [lidocaine hcl].  MEDICATIONS:  Current Outpatient Medications  Medication Sig Dispense Refill  . acetaminophen (TYLENOL) 500 MG tablet Take 1,000 mg by mouth daily as needed for moderate pain or headache.     Marland Kitchen atorvastatin (LIPITOR) 20 MG tablet Take 20 mg by mouth daily.    . brimonidine-timolol (COMBIGAN) 0.2-0.5 % ophthalmic solution Place 1 drop into both eyes every 12 (twelve) hours.    Marland Kitchen CARTIA XT 120 MG 24 hr capsule TAKE ONE CAPSULE BY MOUTH DAILY, STOPPING METOPROLOL 90 capsule 3  . cetirizine (ZYRTEC) 10 MG tablet Take  10 mg by mouth daily.    . Cholecalciferol (VITAMIN D3) 125 MCG (5000 UT) CAPS Take 5,000 Units by mouth at bedtime.     . Coenzyme Q10 10 MG capsule Take 10 mg by mouth at bedtime.    Marland Kitchen dexlansoprazole (DEXILANT) 60 MG capsule Take 60 mg by mouth daily.    . flecainide  (TAMBOCOR) 100 MG tablet Take 1 tablet (100 mg total) by mouth 2 (two) times daily. Please make overdue appt with Dr. Curt Bears before anymore refills. 2nd attempt 30 tablet 0  . fluticasone (FLONASE) 50 MCG/ACT nasal spray Place 1 spray into both nostrils daily as needed for allergies.     . furosemide (LASIX) 20 MG tablet TAKE 1 TABLET(20 MG) BY MOUTH DAILY 90 tablet 0  . Krill Oil 500 MG CAPS Take 500 mg by mouth at bedtime.    . lidocaine-prilocaine (EMLA) cream Apply to affected area once 30 g 3  . LORazepam (ATIVAN) 0.5 MG tablet Take 1 tablet (0.5 mg total) by mouth at bedtime as needed (Nausea or vomiting). (Patient not taking: Reported on 12/20/2018) 30 tablet 0  . montelukast (SINGULAIR) 10 MG tablet Take 10 mg by mouth daily.    . Multiple Vitamin (MULTIVITAMIN) tablet Take 1 tablet by mouth at bedtime.     . ondansetron (ZOFRAN) 8 MG tablet Take 1 tablet (8 mg total) by mouth 2 (two) times daily as needed (Nausea or vomiting). (Patient not taking: Reported on 12/20/2018) 30 tablet 1  . Polyethyl Glycol-Propyl Glycol (SYSTANE OP) Place 1 drop into both eyes 2 (two) times daily as needed (dry eyes).    . prochlorperazine (COMPAZINE) 10 MG tablet TAKE 1 TABLET(10 MG) BY MOUTH EVERY 6 HOURS AS NEEDED FOR NAUSEA OR VOMITING (Patient not taking: No sig reported) 385 tablet 1  . quinapril (ACCUPRIL) 20 MG tablet TAKE 1 TABLET(20 MG) BY MOUTH EVERY MORNING 90 tablet 0  . traMADol (ULTRAM) 50 MG tablet Take 2 tablets (100 mg total) by mouth every 6 (six) hours as needed. (Patient not taking: Reported on 12/20/2018) 12 tablet 0   No current facility-administered medications for this visit.    Facility-Administered Medications Ordered in Other Visits  Medication Dose Route Frequency Provider Last Rate Last Dose  . sodium chloride flush (NS) 0.9 % injection 10 mL  10 mL Intracatheter Once Nicholas Lose, MD        PHYSICAL EXAMINATION: ECOG PERFORMANCE STATUS: 0 - Asymptomatic  Vitals:   01/01/19  1332  BP: 130/63  Pulse: (!) 56  Resp: 19  Temp: 97.8 F (36.6 C)  SpO2: 98%   Filed Weights   01/01/19 1332  Weight: 164 lb (74.4 kg)    GENERAL: alert, no distress and comfortable SKIN: skin color, texture, turgor are normal, no rashes or significant lesions EYES: normal, Conjunctiva are pink and non-injected, sclera clear OROPHARYNX: no exudate, no erythema and lips, buccal mucosa, and tongue normal  NECK: supple, thyroid normal size, non-tender, without nodularity LYMPH: no palpable lymphadenopathy in the cervical, axillary or inguinal LUNGS: clear to auscultation and percussion with normal breathing effort HEART: regular rate & rhythm and no murmurs and no lower extremity edema ABDOMEN: abdomen soft, non-tender and normal bowel sounds MUSCULOSKELETAL: no cyanosis of digits and no clubbing  NEURO: alert & oriented x 3 with fluent speech, no focal motor/sensory deficits EXTREMITIES: No lower extremity edema  LABORATORY DATA:  I have reviewed the data as listed CMP Latest Ref Rng & Units 12/11/2018 11/30/2018 11/20/2018  Glucose 70 - 99 mg/dL 161(H) 173(H) 177(H)  BUN 8 - 23 mg/dL _0 Creatinine 0.44 - 1.00 mg/dL 0.73 0.71 0.71  Sodium 135 - 145 mmol/L 141 138 140  Potassium 3.5 - 5.1 mmol/L 4.0 4.1 4.2  Chloride 98 - 111 mmol/L 106 105 106  CO2 22 - 32 mmol/L _1 Calcium 8.9 - 10.3 mg/dL 8.9 9.4 8.8(L)  Total Protein 6.5 - 8.1 g/dL 6.2(L) - 5.9(L)  Total Bilirubin 0.3 - 1.2 mg/dL 0.9 - 0.8  Alkaline Phos 38 - 126 U/L 106 - 80  AST 15 - 41 U/L 28 - 22  ALT 0 - 44 U/L 46(H) - 54(H)    Lab Results  Component Value Date   WBC 5.7 01/01/2019   HGB 13.9 01/01/2019   HCT 39.6 01/01/2019   MCV 102.9 (H) 01/01/2019   PLT 172 01/01/2019   NEUTROABS 3.5 01/01/2019    ASSESSMENT & PLAN:  Malignant neoplasm of upper-inner quadrant of left breast in female, estrogen receptor negative ([place]) 08/17/2018:Screening mammogram detected abnormality in the left breast by  ultrasound irregular mass 10:00 position 1.3 cm, 10 o'clock position no enlarged lymph nodes, ER 0%, PR 0%, Ki-67 80%, HER-2 +3+ by IHC, grade 3, lymphovascular invasion present, T1c N0 stage Ia clinical stage  12/05/2018: Left lumpectomy: No residual invasive carcinoma, margins negative, 0/2 lymph nodes negative, complete pathologic response [id number]  Treatment plan: Herceptin maintenance to complete 1 year of therapy. Adjuvant radiation therapy will commence soon.  Return to clinic every 3 weeks for Herceptin every 6 weeks for follow-up with me with labs    No orders of the defined types were placed in this encounter.  The patient has a good understanding of the overall plan. she agrees with it. she will call with any problems that may develop before the next visit here.  Nicholas Lose, MD 01/01/2019  Julious Oka Dorshimer am acting as scribe for Dr. Nicholas Lose.  I have reviewed the above documentation for accuracy and completeness, and I agree with the above.

## 2019-01-01 ENCOUNTER — Inpatient Hospital Stay: Payer: PRIVATE HEALTH INSURANCE | Attending: Hematology and Oncology

## 2019-01-01 ENCOUNTER — Other Ambulatory Visit: Payer: Self-pay

## 2019-01-01 ENCOUNTER — Inpatient Hospital Stay: Payer: PRIVATE HEALTH INSURANCE

## 2019-01-01 ENCOUNTER — Inpatient Hospital Stay (HOSPITAL_BASED_OUTPATIENT_CLINIC_OR_DEPARTMENT_OTHER): Payer: PRIVATE HEALTH INSURANCE | Admitting: Hematology and Oncology

## 2019-01-01 DIAGNOSIS — C50212 Malignant neoplasm of upper-inner quadrant of left female breast: Secondary | ICD-10-CM

## 2019-01-01 DIAGNOSIS — Z171 Estrogen receptor negative status [ER-]: Secondary | ICD-10-CM

## 2019-01-01 DIAGNOSIS — Z5112 Encounter for antineoplastic immunotherapy: Secondary | ICD-10-CM | POA: Diagnosis present

## 2019-01-01 DIAGNOSIS — Z95828 Presence of other vascular implants and grafts: Secondary | ICD-10-CM

## 2019-01-01 LAB — CMP (CANCER CENTER ONLY)
ALT: 67 U/L — AB (ref 0–44)
AST: 53 U/L — ABNORMAL HIGH (ref 15–41)
Albumin: 3.6 g/dL (ref 3.5–5.0)
Alkaline Phosphatase: 96 U/L (ref 38–126)
Anion gap: 11 (ref 5–15)
BUN: 13 mg/dL (ref 8–23)
CO2: 24 mmol/L (ref 22–32)
Calcium: 9 mg/dL (ref 8.9–10.3)
Chloride: 105 mmol/L (ref 98–111)
Creatinine: 0.78 mg/dL (ref 0.44–1.00)
GFR, Estimated: >60 mL/min (ref >60)
Glucose, Bld: 205 mg/dL — ABNORMAL HIGH (ref 70–99)
Potassium: 4.1 mmol/L (ref 3.5–5.1)
SODIUM: 140 mmol/L (ref 135–145)
Total Bilirubin: 0.7 mg/dL (ref 0.3–1.2)
Total Protein: 6.6 g/dL (ref 6.5–8.1)

## 2019-01-01 LAB — CBC WITH DIFFERENTIAL (CANCER CENTER ONLY)
Abs Immature Granulocytes: 0.02 10*3/uL (ref 0.00–0.07)
Basophils Absolute: 0 10*3/uL (ref 0.0–0.1)
Basophils Relative: 1 %
Eosinophils Absolute: 0.3 10*3/uL (ref 0.0–0.5)
Eosinophils Relative: 6 %
HCT: 39.6 % (ref 36.0–46.0)
Hemoglobin: 13.9 g/dL (ref 12.0–15.0)
Immature Granulocytes: 0 %
Lymphocytes Relative: 24 %
Lymphs Abs: 1.3 10*3/uL (ref 0.7–4.0)
MCH: 36.1 pg — ABNORMAL HIGH (ref 26.0–34.0)
MCHC: 35.1 g/dL (ref 30.0–36.0)
MCV: 102.9 fL — ABNORMAL HIGH (ref 80.0–100.0)
MONOS PCT: 8 %
Monocytes Absolute: 0.4 10*3/uL (ref 0.1–1.0)
Neutro Abs: 3.5 10*3/uL (ref 1.7–7.7)
Neutrophils Relative %: 61 %
Platelet Count: 172 10*3/uL (ref 150–400)
RBC: 3.85 MIL/uL — ABNORMAL LOW (ref 3.87–5.11)
RDW: 12.2 % (ref 11.5–15.5)
WBC Count: 5.7 10*3/uL (ref 4.0–10.5)
nRBC: 0 % (ref 0.0–0.2)

## 2019-01-01 MED ORDER — SODIUM CHLORIDE 0.9 % IV SOLN
Freq: Once | INTRAVENOUS | Status: AC
  Administered 2019-01-01: 15:00:00 via INTRAVENOUS
  Filled 2019-01-01: qty 250

## 2019-01-01 MED ORDER — SODIUM CHLORIDE 0.9% FLUSH
10.0000 mL | INTRAVENOUS | Status: DC | PRN
  Administered 2019-01-01: 10 mL
  Filled 2019-01-01: qty 10

## 2019-01-01 MED ORDER — SODIUM CHLORIDE 0.9% FLUSH
10.0000 mL | Freq: Once | INTRAVENOUS | Status: AC
  Administered 2019-01-01: 10 mL
  Filled 2019-01-01: qty 10

## 2019-01-01 MED ORDER — HEPARIN SOD (PORK) LOCK FLUSH 100 UNIT/ML IV SOLN
500.0000 [IU] | Freq: Once | INTRAVENOUS | Status: AC | PRN
  Administered 2019-01-01: 500 [IU]
  Filled 2019-01-01: qty 5

## 2019-01-01 MED ORDER — TRASTUZUMAB CHEMO 150 MG IV SOLR
450.0000 mg | Freq: Once | INTRAVENOUS | Status: AC
  Administered 2019-01-01: 450 mg via INTRAVENOUS
  Filled 2019-01-01: qty 21.43

## 2019-01-01 NOTE — Patient Instructions (Signed)
[facility] Discharge Instructions for Patients Receiving Chemotherapy Today you received the following chemotherapy agents:  Herceptin To help prevent nausea and vomiting after your treatment, we encourage you to take your nausea medication as prescribed.   If you develop nausea and vomiting that is not controlled by your nausea medication, call the clinic.   BELOW ARE SYMPTOMS THAT SHOULD BE REPORTED IMMEDIATELY:  *FEVER GREATER THAN 100.5 F  *CHILLS WITH OR WITHOUT FEVER  NAUSEA AND VOMITING THAT IS NOT CONTROLLED WITH YOUR NAUSEA MEDICATION  *UNUSUAL SHORTNESS OF BREATH  *UNUSUAL BRUISING OR BLEEDING  TENDERNESS IN MOUTH AND THROAT WITH OR WITHOUT PRESENCE OF ULCERS  *URINARY PROBLEMS  *BOWEL PROBLEMS  UNUSUAL RASH Items with * indicate a potential emergency and should be followed up as soon as possible.  Feel free to call the clinic should you have any questions or concerns. The clinic phone number is [phone].  Please show the CHEMO ALERT CARD at check-in to the Emergency Department and triage nurse.   

## 2019-01-01 NOTE — Assessment & Plan Note (Signed)
08/17/2018:Screening mammogram detected abnormality in the left breast by ultrasound irregular mass 10:00 position 1.3 cm, 10 o'clock position no enlarged lymph nodes, ER 0%, PR 0%, Ki-67 80%, HER-2 +3+ by IHC, grade 3, lymphovascular invasion present, T1c N0 stage Ia clinical stage  12/05/2018: Left lumpectomy: No residual invasive carcinoma, margins negative, 0/2 lymph nodes negative, complete pathologic response [id number]  Treatment plan: Herceptin maintenance to complete 1 year of therapy. Adjuvant radiation therapy will commence soon.  Return to clinic every 3 weeks for Herceptin every 6 weeks for follow-up with me with labs

## 2019-01-02 ENCOUNTER — Other Ambulatory Visit: Payer: Self-pay | Admitting: Cardiology

## 2019-01-07 ENCOUNTER — Other Ambulatory Visit: Payer: Self-pay | Admitting: General Surgery

## 2019-01-07 ENCOUNTER — Ambulatory Visit
Admission: RE | Admit: 2019-01-07 | Discharge: 2019-01-07 | Disposition: A | Discharge status: Home / Self Care | Payer: PRIVATE HEALTH INSURANCE | Source: Ambulatory Visit | Attending: General Surgery | Admitting: General Surgery

## 2019-01-07 ENCOUNTER — Other Ambulatory Visit: Payer: Self-pay

## 2019-01-07 DIAGNOSIS — N6489 Other specified disorders of breast: Secondary | ICD-10-CM

## 2019-01-08 ENCOUNTER — Ambulatory Visit
Admission: RE | Admit: 2019-01-08 | Discharge: 2019-01-08 | Disposition: A | Discharge status: Home / Self Care | Payer: PRIVATE HEALTH INSURANCE | Source: Ambulatory Visit | Attending: Radiation Oncology | Admitting: Radiation Oncology

## 2019-01-08 ENCOUNTER — Other Ambulatory Visit: Payer: Self-pay

## 2019-01-08 DIAGNOSIS — Z171 Estrogen receptor negative status [ER-]: Secondary | ICD-10-CM | POA: Insufficient documentation

## 2019-01-08 DIAGNOSIS — C50212 Malignant neoplasm of upper-inner quadrant of left female breast: Secondary | ICD-10-CM | POA: Insufficient documentation

## 2019-01-08 DIAGNOSIS — Z510 Encounter for antineoplastic radiation therapy: Secondary | ICD-10-CM | POA: Diagnosis present

## 2019-01-09 DIAGNOSIS — Z510 Encounter for antineoplastic radiation therapy: Secondary | ICD-10-CM | POA: Diagnosis not present

## 2019-01-09 NOTE — Progress Notes (Signed)
  Radiation Oncology         [phone]) [phone] ________________________________  Name: Andrea Clarke MRN: [id number]  Date: 01/08/2019  DOB: August 24, 1946  Optical Surface Tracking Plan:  Since intensity modulated radiotherapy (IMRT) and 3D conformal radiation treatment methods are predicated on accurate and precise positioning for treatment, intrafraction motion monitoring is medically necessary to ensure accurate and safe treatment delivery.  The ability to quantify intrafraction motion without excessive ionizing radiation dose can only be performed with optical surface tracking. Accordingly, surface imaging offers the opportunity to obtain 3D measurements of patient position throughout IMRT and 3D treatments without excessive radiation exposure.  I am ordering optical surface tracking for this patient's upcoming course of radiotherapy. ________________________________  Kyung Rudd, MD 01/09/2019 9:38 AM    Reference:   Particia Jasper, et al. Surface imaging-based analysis of intrafraction motion for breast radiotherapy patients.Journal of [employer], n. 6, nov. 2014. ISSN [id number].   Available at: <http://www.jacmp.org/index.php/jacmp/article/view/4957>.

## 2019-01-09 NOTE — Progress Notes (Signed)
  Radiation Oncology         [phone]) [phone] ________________________________  Name: Andrea Clarke MRN: [id number]  Date: 01/08/2019  DOB: 1946-08-13   DIAGNOSIS:     ICD-10-CM   1. Malignant neoplasm of upper-inner quadrant of left breast in female, estrogen receptor negative ([place]) C50.212    Z17.1     SIMULATION AND TREATMENT PLANNING NOTE  The patient presented for simulation prior to beginning her course of radiation treatment for her diagnosis of left-sided breast cancer. The patient was placed in a supine position on a breast board. A customized vac-lock bag was constructed and this complex treatment device will be used on a daily basis during her treatment. In this fashion, a CT scan was obtained through the chest area and an isocenter was placed near the chest wall within the breast.  The patient will be planned to receive a course of radiation initially to a dose of 42.56 Gy. This will consist of a whole breast radiotherapy technique. To accomplish this, 2 customized blocks have been designed which will correspond to medial and lateral whole breast tangent fields. This treatment will be accomplished at 2.66 Gy per fraction. A forward planning technique will also be evaluated to determine if this approach improves the plan. It is anticipated that the patient will then receive a 8 Gy boost to the seroma cavity which has been contoured. This will be accomplished at 2 Gy per fraction.   This initial treatment will consist of a 3-D conformal technique. The seroma has been contoured as the primary target structure. Additionally, dose volume histograms of both this target as well as the lungs and heart will also be evaluated. Such an approach is necessary to ensure that the target area is adequately covered while the nearby critical  normal structures are adequately spared.  Plan:  The final anticipated total dose therefore will correspond to 50.56 Gy.   Special treatment procedure was  performed today due to the extra time and effort required by myself to plan and prepare this patient for deep inspiration breath hold technique.  I have determined cardiac sparing to be of benefit to this patient to prevent long term cardiac damage due to radiation of the heart.  Bellows were placed on the patient's abdomen. To facilitate cardiac sparing, the patient was coached by the radiation therapists on breath hold techniques and breathing practice was performed. Practice waveforms were obtained. The patient was then scanned while maintaining breath hold in the treatment position.  This image was then transferred over to the imaging specialist. The imaging specialist then created a fusion of the free breathing and breath hold scans using the chest wall as the stable structure. I personally reviewed the fusion in axial, coronal and sagittal image planes.  Excellent cardiac sparing was obtained.  I felt the patient is an appropriate candidate for breath hold and the patient will be treated as such.  The image fusion was then reviewed with the patient to reinforce the necessity of reproducible breath hold.     _______________________________   Jodelle Gross, MD, PhD

## 2019-01-14 ENCOUNTER — Other Ambulatory Visit: Payer: Self-pay | Admitting: Cardiology

## 2019-01-15 ENCOUNTER — Other Ambulatory Visit: Payer: Self-pay

## 2019-01-15 ENCOUNTER — Ambulatory Visit
Admission: RE | Admit: 2019-01-15 | Discharge: 2019-01-15 | Disposition: A | Discharge status: Home / Self Care | Payer: PRIVATE HEALTH INSURANCE | Source: Ambulatory Visit | Attending: Radiation Oncology | Admitting: Radiation Oncology

## 2019-01-15 DIAGNOSIS — Z510 Encounter for antineoplastic radiation therapy: Secondary | ICD-10-CM | POA: Diagnosis not present

## 2019-01-16 ENCOUNTER — Telehealth: Payer: Self-pay

## 2019-01-16 ENCOUNTER — Other Ambulatory Visit: Payer: Self-pay

## 2019-01-16 ENCOUNTER — Ambulatory Visit
Admission: RE | Admit: 2019-01-16 | Discharge: 2019-01-16 | Disposition: A | Discharge status: Home / Self Care | Payer: PRIVATE HEALTH INSURANCE | Source: Ambulatory Visit | Attending: Radiation Oncology | Admitting: Radiation Oncology

## 2019-01-16 DIAGNOSIS — Z510 Encounter for antineoplastic radiation therapy: Secondary | ICD-10-CM | POA: Insufficient documentation

## 2019-01-16 DIAGNOSIS — Z171 Estrogen receptor negative status [ER-]: Principal | ICD-10-CM

## 2019-01-16 DIAGNOSIS — C50212 Malignant neoplasm of upper-inner quadrant of left female breast: Secondary | ICD-10-CM

## 2019-01-16 DIAGNOSIS — Z5112 Encounter for antineoplastic immunotherapy: Secondary | ICD-10-CM | POA: Diagnosis not present

## 2019-01-16 MED ORDER — ALRA NON-METALLIC DEODORANT (RAD-ONC)
1.0000 "application " | Freq: Once | TOPICAL | Status: AC
  Administered 2019-01-16: 1 via TOPICAL

## 2019-01-16 NOTE — Progress Notes (Signed)
Pt here for patient teaching.  Pt given Radiation and You booklet, skin care instructions, Alra deodorant and Radiaplex gel.  Reviewed areas of pertinence such as fatigue, hair loss, skin changes, breast tenderness and breast swelling . Pt able to give teach back of to pat skin and use unscented/gentle soap,apply Radiaplex bid, avoid applying anything to skin within 4 hours of treatment, avoid wearing an under wire bra and to use an electric razor if they must shave. Pt demonstrated understanding, of information given and will contact nursing with any questions or concerns.     [email or web address]/whattoexpect/index      

## 2019-01-16 NOTE — Telephone Encounter (Signed)
Spoke with pt and advised not to use radiaplex gel due to a contraindication alert for lidocaine. Advised pt that an alternative cream will be given for skin. Pt verbalized understanding.

## 2019-01-17 ENCOUNTER — Other Ambulatory Visit: Payer: Self-pay

## 2019-01-17 ENCOUNTER — Ambulatory Visit
Admission: RE | Admit: 2019-01-17 | Discharge: 2019-01-17 | Disposition: A | Discharge status: Home / Self Care | Payer: PRIVATE HEALTH INSURANCE | Source: Ambulatory Visit | Attending: Radiation Oncology | Admitting: Radiation Oncology

## 2019-01-17 DIAGNOSIS — Z5112 Encounter for antineoplastic immunotherapy: Secondary | ICD-10-CM | POA: Diagnosis not present

## 2019-01-18 ENCOUNTER — Ambulatory Visit
Admission: RE | Admit: 2019-01-18 | Discharge: 2019-01-18 | Disposition: A | Discharge status: Home / Self Care | Payer: PRIVATE HEALTH INSURANCE | Source: Ambulatory Visit | Attending: Radiation Oncology | Admitting: Radiation Oncology

## 2019-01-18 ENCOUNTER — Other Ambulatory Visit: Payer: Self-pay

## 2019-01-18 DIAGNOSIS — C50212 Malignant neoplasm of upper-inner quadrant of left female breast: Secondary | ICD-10-CM

## 2019-01-18 DIAGNOSIS — Z510 Encounter for antineoplastic radiation therapy: Secondary | ICD-10-CM | POA: Diagnosis not present

## 2019-01-18 DIAGNOSIS — Z171 Estrogen receptor negative status [ER-]: Principal | ICD-10-CM

## 2019-01-18 DIAGNOSIS — Z5112 Encounter for antineoplastic immunotherapy: Secondary | ICD-10-CM | POA: Diagnosis not present

## 2019-01-18 MED ORDER — SONAFINE EX EMUL
1.0000 "application " | Freq: Once | CUTANEOUS | Status: AC
  Administered 2019-01-18: 1 via TOPICAL

## 2019-01-21 ENCOUNTER — Ambulatory Visit
Admission: RE | Admit: 2019-01-21 | Discharge: 2019-01-21 | Disposition: A | Discharge status: Home / Self Care | Payer: PRIVATE HEALTH INSURANCE | Source: Ambulatory Visit | Attending: Radiation Oncology | Admitting: Radiation Oncology

## 2019-01-21 ENCOUNTER — Other Ambulatory Visit: Payer: Self-pay

## 2019-01-21 DIAGNOSIS — Z5112 Encounter for antineoplastic immunotherapy: Secondary | ICD-10-CM | POA: Diagnosis not present

## 2019-01-22 ENCOUNTER — Other Ambulatory Visit: Payer: Self-pay

## 2019-01-22 ENCOUNTER — Inpatient Hospital Stay: Payer: PRIVATE HEALTH INSURANCE

## 2019-01-22 ENCOUNTER — Inpatient Hospital Stay: Payer: PRIVATE HEALTH INSURANCE | Attending: Hematology and Oncology

## 2019-01-22 ENCOUNTER — Ambulatory Visit
Admission: RE | Admit: 2019-01-22 | Discharge: 2019-01-22 | Disposition: A | Discharge status: Home / Self Care | Payer: PRIVATE HEALTH INSURANCE | Source: Ambulatory Visit | Attending: Radiation Oncology | Admitting: Radiation Oncology

## 2019-01-22 ENCOUNTER — Other Ambulatory Visit: Payer: Self-pay | Admitting: Hematology and Oncology

## 2019-01-22 VITALS — BP 135/78 | HR 64 | Temp 98.1°F | Resp 18

## 2019-01-22 DIAGNOSIS — Z5112 Encounter for antineoplastic immunotherapy: Secondary | ICD-10-CM | POA: Diagnosis not present

## 2019-01-22 DIAGNOSIS — Z171 Estrogen receptor negative status [ER-]: Principal | ICD-10-CM

## 2019-01-22 DIAGNOSIS — C50212 Malignant neoplasm of upper-inner quadrant of left female breast: Secondary | ICD-10-CM

## 2019-01-22 DIAGNOSIS — Z95828 Presence of other vascular implants and grafts: Secondary | ICD-10-CM

## 2019-01-22 DIAGNOSIS — Z510 Encounter for antineoplastic radiation therapy: Secondary | ICD-10-CM | POA: Insufficient documentation

## 2019-01-22 LAB — CBC WITH DIFFERENTIAL (CANCER CENTER ONLY)
Abs Immature Granulocytes: 0.02 10*3/uL (ref 0.00–0.07)
Basophils Absolute: 0.1 10*3/uL (ref 0.0–0.1)
Basophils Relative: 1 %
Eosinophils Absolute: 1 10*3/uL — ABNORMAL HIGH (ref 0.0–0.5)
Eosinophils Relative: 16 %
HCT: 41.8 % (ref 36.0–46.0)
Hemoglobin: 14.4 g/dL (ref 12.0–15.0)
Immature Granulocytes: 0 %
Lymphocytes Relative: 20 %
Lymphs Abs: 1.3 10*3/uL (ref 0.7–4.0)
MCH: 34.6 pg — ABNORMAL HIGH (ref 26.0–34.0)
MCHC: 34.4 g/dL (ref 30.0–36.0)
MCV: 100.5 fL — ABNORMAL HIGH (ref 80.0–100.0)
Monocytes Absolute: 0.5 10*3/uL (ref 0.1–1.0)
Monocytes Relative: 8 %
Neutro Abs: 3.5 10*3/uL (ref 1.7–7.7)
Neutrophils Relative %: 55 %
Platelet Count: 159 10*3/uL (ref 150–400)
RBC: 4.16 MIL/uL (ref 3.87–5.11)
RDW: 11.5 % (ref 11.5–15.5)
WBC Count: 6.4 10*3/uL (ref 4.0–10.5)
nRBC: 0 % (ref 0.0–0.2)

## 2019-01-22 LAB — CMP (CANCER CENTER ONLY)
ALT: 78 U/L — ABNORMAL HIGH (ref 0–44)
AST: 52 U/L — ABNORMAL HIGH (ref 15–41)
Albumin: 3.7 g/dL (ref 3.5–5.0)
Alkaline Phosphatase: 89 U/L (ref 38–126)
Anion gap: 10 (ref 5–15)
BUN: 13 mg/dL (ref 8–23)
CO2: 25 mmol/L (ref 22–32)
Calcium: 8.9 mg/dL (ref 8.9–10.3)
Chloride: 105 mmol/L (ref 98–111)
Creatinine: 0.81 mg/dL (ref 0.44–1.00)
GFR, Est AFR Am: >60 mL/min (ref >60)
GFR, Estimated: >60 mL/min (ref >60)
Glucose, Bld: 211 mg/dL — ABNORMAL HIGH (ref 70–99)
Potassium: 4.1 mmol/L (ref 3.5–5.1)
Sodium: 140 mmol/L (ref 135–145)
Total Bilirubin: 0.6 mg/dL (ref 0.3–1.2)
Total Protein: 6.6 g/dL (ref 6.5–8.1)

## 2019-01-22 MED ORDER — SODIUM CHLORIDE 0.9 % IV SOLN
Freq: Once | INTRAVENOUS | Status: AC
  Administered 2019-01-22: 14:00:00 via INTRAVENOUS
  Filled 2019-01-22: qty 250

## 2019-01-22 MED ORDER — ACETAMINOPHEN 325 MG PO TABS
650.0000 mg | ORAL_TABLET | Freq: Once | ORAL | Status: AC
  Administered 2019-01-22: 650 mg via ORAL

## 2019-01-22 MED ORDER — DIPHENHYDRAMINE HCL 25 MG PO CAPS
25.0000 mg | ORAL_CAPSULE | Freq: Once | ORAL | Status: AC
  Administered 2019-01-22: 25 mg via ORAL

## 2019-01-22 MED ORDER — ACETAMINOPHEN 325 MG PO TABS
ORAL_TABLET | ORAL | Status: AC
  Filled 2019-01-22: qty 2

## 2019-01-22 MED ORDER — HEPARIN SOD (PORK) LOCK FLUSH 100 UNIT/ML IV SOLN
500.0000 [IU] | Freq: Once | INTRAVENOUS | Status: AC | PRN
  Administered 2019-01-22: 500 [IU]
  Filled 2019-01-22: qty 5

## 2019-01-22 MED ORDER — TRASTUZUMAB CHEMO 150 MG IV SOLR
450.0000 mg | Freq: Once | INTRAVENOUS | Status: AC
  Administered 2019-01-22: 450 mg via INTRAVENOUS
  Filled 2019-01-22: qty 21.43

## 2019-01-22 MED ORDER — SODIUM CHLORIDE 0.9% FLUSH
10.0000 mL | Freq: Once | INTRAVENOUS | Status: AC
  Administered 2019-01-22: 13:00:00 10 mL
  Filled 2019-01-22: qty 10

## 2019-01-22 MED ORDER — DIPHENHYDRAMINE HCL 25 MG PO CAPS
ORAL_CAPSULE | ORAL | Status: AC
  Filled 2019-01-22: qty 1

## 2019-01-22 MED ORDER — SODIUM CHLORIDE 0.9% FLUSH
10.0000 mL | INTRAVENOUS | Status: DC | PRN
  Administered 2019-01-22: 10 mL
  Filled 2019-01-22: qty 10

## 2019-01-22 NOTE — Patient Instructions (Signed)
[facility] Discharge Instructions for Patients Receiving Chemotherapy Today you received the following chemotherapy agents:  Herceptin To help prevent nausea and vomiting after your treatment, we encourage you to take your nausea medication as prescribed.   If you develop nausea and vomiting that is not controlled by your nausea medication, call the clinic.   BELOW ARE SYMPTOMS THAT SHOULD BE REPORTED IMMEDIATELY:  *FEVER GREATER THAN 100.5 F  *CHILLS WITH OR WITHOUT FEVER  NAUSEA AND VOMITING THAT IS NOT CONTROLLED WITH YOUR NAUSEA MEDICATION  *UNUSUAL SHORTNESS OF BREATH  *UNUSUAL BRUISING OR BLEEDING  TENDERNESS IN MOUTH AND THROAT WITH OR WITHOUT PRESENCE OF ULCERS  *URINARY PROBLEMS  *BOWEL PROBLEMS  UNUSUAL RASH Items with * indicate a potential emergency and should be followed up as soon as possible.  Feel free to call the clinic should you have any questions or concerns. The clinic phone number is [phone].  Please show the CHEMO ALERT CARD at check-in to the Emergency Department and triage nurse.   

## 2019-01-22 NOTE — Addendum Note (Signed)
Addended by: Nicholas Lose on: 01/22/2019 02:23 PM   Modules accepted: Orders

## 2019-01-23 ENCOUNTER — Ambulatory Visit
Admission: RE | Admit: 2019-01-23 | Discharge: 2019-01-23 | Disposition: A | Discharge status: Home / Self Care | Payer: PRIVATE HEALTH INSURANCE | Source: Ambulatory Visit | Attending: Radiation Oncology | Admitting: Radiation Oncology

## 2019-01-23 ENCOUNTER — Other Ambulatory Visit: Payer: Self-pay

## 2019-01-23 DIAGNOSIS — Z5112 Encounter for antineoplastic immunotherapy: Secondary | ICD-10-CM | POA: Diagnosis not present

## 2019-01-24 ENCOUNTER — Ambulatory Visit
Admission: RE | Admit: 2019-01-24 | Discharge: 2019-01-24 | Disposition: A | Discharge status: Home / Self Care | Payer: PRIVATE HEALTH INSURANCE | Source: Ambulatory Visit | Attending: Radiation Oncology | Admitting: Radiation Oncology

## 2019-01-24 ENCOUNTER — Other Ambulatory Visit: Payer: Self-pay

## 2019-01-24 DIAGNOSIS — Z5112 Encounter for antineoplastic immunotherapy: Secondary | ICD-10-CM | POA: Diagnosis not present

## 2019-01-25 ENCOUNTER — Other Ambulatory Visit: Payer: Self-pay

## 2019-01-25 ENCOUNTER — Ambulatory Visit
Admission: RE | Admit: 2019-01-25 | Discharge: 2019-01-25 | Disposition: A | Discharge status: Home / Self Care | Payer: PRIVATE HEALTH INSURANCE | Source: Ambulatory Visit | Attending: Radiation Oncology | Admitting: Radiation Oncology

## 2019-01-25 DIAGNOSIS — Z5112 Encounter for antineoplastic immunotherapy: Secondary | ICD-10-CM | POA: Diagnosis not present

## 2019-01-28 ENCOUNTER — Ambulatory Visit
Admission: RE | Admit: 2019-01-28 | Discharge: 2019-01-28 | Disposition: A | Discharge status: Home / Self Care | Payer: PRIVATE HEALTH INSURANCE | Source: Ambulatory Visit | Attending: Radiation Oncology | Admitting: Radiation Oncology

## 2019-01-28 ENCOUNTER — Other Ambulatory Visit: Payer: Self-pay

## 2019-01-28 DIAGNOSIS — Z5112 Encounter for antineoplastic immunotherapy: Secondary | ICD-10-CM | POA: Diagnosis not present

## 2019-01-29 ENCOUNTER — Ambulatory Visit
Admission: RE | Admit: 2019-01-29 | Discharge: 2019-01-29 | Disposition: A | Discharge status: Home / Self Care | Payer: PRIVATE HEALTH INSURANCE | Source: Ambulatory Visit | Attending: Radiation Oncology | Admitting: Radiation Oncology

## 2019-01-29 ENCOUNTER — Other Ambulatory Visit: Payer: Self-pay

## 2019-01-29 DIAGNOSIS — Z5112 Encounter for antineoplastic immunotherapy: Secondary | ICD-10-CM | POA: Diagnosis not present

## 2019-01-30 ENCOUNTER — Ambulatory Visit
Admission: RE | Admit: 2019-01-30 | Discharge: 2019-01-30 | Disposition: A | Discharge status: Home / Self Care | Payer: PRIVATE HEALTH INSURANCE | Source: Ambulatory Visit | Attending: Radiation Oncology | Admitting: Radiation Oncology

## 2019-01-30 ENCOUNTER — Other Ambulatory Visit: Payer: Self-pay

## 2019-01-30 DIAGNOSIS — Z5112 Encounter for antineoplastic immunotherapy: Secondary | ICD-10-CM | POA: Diagnosis not present

## 2019-01-31 ENCOUNTER — Ambulatory Visit
Admission: RE | Admit: 2019-01-31 | Discharge: 2019-01-31 | Disposition: A | Discharge status: Home / Self Care | Payer: PRIVATE HEALTH INSURANCE | Source: Ambulatory Visit | Attending: Radiation Oncology | Admitting: Radiation Oncology

## 2019-01-31 ENCOUNTER — Other Ambulatory Visit: Payer: Self-pay

## 2019-01-31 DIAGNOSIS — Z5112 Encounter for antineoplastic immunotherapy: Secondary | ICD-10-CM | POA: Diagnosis not present

## 2019-02-01 ENCOUNTER — Ambulatory Visit: Payer: PRIVATE HEALTH INSURANCE | Admitting: Radiation Oncology

## 2019-02-01 ENCOUNTER — Other Ambulatory Visit: Payer: Self-pay

## 2019-02-01 ENCOUNTER — Ambulatory Visit
Admission: RE | Admit: 2019-02-01 | Discharge: 2019-02-01 | Disposition: A | Discharge status: Home / Self Care | Payer: PRIVATE HEALTH INSURANCE | Source: Ambulatory Visit | Attending: Radiation Oncology | Admitting: Radiation Oncology

## 2019-02-01 DIAGNOSIS — Z5112 Encounter for antineoplastic immunotherapy: Secondary | ICD-10-CM | POA: Diagnosis not present

## 2019-02-04 ENCOUNTER — Other Ambulatory Visit: Payer: Self-pay

## 2019-02-04 ENCOUNTER — Ambulatory Visit
Admission: RE | Admit: 2019-02-04 | Discharge: 2019-02-04 | Disposition: A | Discharge status: Home / Self Care | Payer: PRIVATE HEALTH INSURANCE | Source: Ambulatory Visit | Attending: Radiation Oncology | Admitting: Radiation Oncology

## 2019-02-04 DIAGNOSIS — Z5112 Encounter for antineoplastic immunotherapy: Secondary | ICD-10-CM | POA: Diagnosis not present

## 2019-02-04 NOTE — Assessment & Plan Note (Addendum)
08/17/2018:Screening mammogram detected abnormality in the left breast by ultrasound irregular mass 10:00 position 1.3 cm, 10 o'clock position no enlarged lymph nodes, ER 0%, PR 0%, Ki-67 80%, HER-2 +3+ by IHC, grade 3, lymphovascular invasion present, T1c N0 stage Ia clinical stage  12/05/2018:Left lumpectomy: No residual invasive carcinoma, margins negative, 0/2 lymph nodes negative, complete pathologic response [id number] Adjuvant radiation therapy: 01/16/2019-02/11/2019  Treatment plan: Herceptin maintenance to complete 1 year of therapy. Labs reviewed Since the patient is ER negative there is no role of antiestrogen therapy.  Return to clinic every 3 weeks for Herceptin every 6 weeks for follow-up with me with labs

## 2019-02-05 ENCOUNTER — Ambulatory Visit
Admission: RE | Admit: 2019-02-05 | Discharge: 2019-02-05 | Disposition: A | Discharge status: Home / Self Care | Payer: PRIVATE HEALTH INSURANCE | Source: Ambulatory Visit | Attending: Radiation Oncology | Admitting: Radiation Oncology

## 2019-02-05 ENCOUNTER — Other Ambulatory Visit: Payer: Self-pay

## 2019-02-05 DIAGNOSIS — Z5112 Encounter for antineoplastic immunotherapy: Secondary | ICD-10-CM | POA: Diagnosis not present

## 2019-02-06 ENCOUNTER — Other Ambulatory Visit: Payer: Self-pay

## 2019-02-06 ENCOUNTER — Ambulatory Visit
Admission: RE | Admit: 2019-02-06 | Discharge: 2019-02-06 | Disposition: A | Discharge status: Home / Self Care | Payer: PRIVATE HEALTH INSURANCE | Source: Ambulatory Visit | Attending: Radiation Oncology | Admitting: Radiation Oncology

## 2019-02-06 ENCOUNTER — Ambulatory Visit: Payer: PRIVATE HEALTH INSURANCE

## 2019-02-06 DIAGNOSIS — Z5112 Encounter for antineoplastic immunotherapy: Secondary | ICD-10-CM | POA: Diagnosis not present

## 2019-02-07 ENCOUNTER — Ambulatory Visit: Payer: PRIVATE HEALTH INSURANCE

## 2019-02-07 ENCOUNTER — Other Ambulatory Visit: Payer: Self-pay

## 2019-02-07 DIAGNOSIS — Z5112 Encounter for antineoplastic immunotherapy: Secondary | ICD-10-CM | POA: Diagnosis not present

## 2019-02-08 ENCOUNTER — Ambulatory Visit: Payer: PRIVATE HEALTH INSURANCE

## 2019-02-08 ENCOUNTER — Other Ambulatory Visit: Payer: Self-pay

## 2019-02-08 DIAGNOSIS — Z5112 Encounter for antineoplastic immunotherapy: Secondary | ICD-10-CM | POA: Diagnosis not present

## 2019-02-11 ENCOUNTER — Ambulatory Visit: Payer: PRIVATE HEALTH INSURANCE

## 2019-02-11 ENCOUNTER — Other Ambulatory Visit: Payer: Self-pay

## 2019-02-11 ENCOUNTER — Encounter: Payer: Self-pay | Admitting: Radiation Oncology

## 2019-02-11 ENCOUNTER — Ambulatory Visit
Admission: RE | Admit: 2019-02-11 | Discharge: 2019-02-11 | Disposition: A | Discharge status: Home / Self Care | Payer: PRIVATE HEALTH INSURANCE | Source: Ambulatory Visit | Attending: Radiation Oncology | Admitting: Radiation Oncology

## 2019-02-11 DIAGNOSIS — Z5112 Encounter for antineoplastic immunotherapy: Secondary | ICD-10-CM | POA: Diagnosis not present

## 2019-02-11 NOTE — Progress Notes (Signed)
Patient Care Team: [name], MD as PCP - General (Internal Medicine) Rolm Bookbinder, MD as Consulting Physician (General Surgery)  DIAGNOSIS:    ICD-10-CM   1. Malignant neoplasm of upper-inner quadrant of left breast in female, estrogen receptor negative ([place]) C50.212    Z17.1     SUMMARY OF ONCOLOGIC HISTORY:   Malignant neoplasm of upper-inner quadrant of left breast in female, estrogen receptor negative ([place])   08/17/2018 Initial Diagnosis    Screening mammogram detected abnormality in the left breast by ultrasound irregular mass 10:00 position 1.3 cm, 10 o'clock position no enlarged lymph nodes, ER 0%, PR 0%, Ki-67 80%, HER-2 +3+ by [facility]    08/28/2018 Cancer Staging    Staging form: Breast, AJCC 8th Edition - Clinical stage from 08/28/2018: Stage IA (cT1c, cN0, cM0, G3, ER-, PR-, HER2+) - Signed by Nicholas Lose, MD on 08/28/2018    08/28/2018 Breast MRI    malignancy within the UPPER INNER LEFT breast with dominant mass measuring 2.1 cm. With small adjacent satellite nodules, the entire area 1.5 x 3 x 2.2 cm.No evidence of abnormal lymph nodes or RIGHT breast malignancy.    09/03/2018 -  Chemotherapy    Weekly Taxol and Herceptin followed by maintenance Herceptin for one year.     12/05/2018 Surgery    Left lumpectomy: No residual invasive carcinoma, margins negative, 0/2 lymph nodes negative, complete pathologic response [id number]     CHIEF COMPLIANT: Follow-up after radiation on maintenance Herceptin  INTERVAL HISTORY: Andrea Clarke is a 73 y.o. with above-mentioned history of left breast cancer who underwent neoadjuvant chemotherapy with Taxol and Herceptin, followed by lumpectomy, radiation, and is currently on maintenance Herceptin. She had a seroma in her left breast at the surgical scar aspirated on 01/07/19. She presents to the clinic today for treatment.  She is tolerating Herceptin extremely well without any problems or concerns.  Denies any diarrhea  or constipation.  Denies any fevers or chills denies any nausea or vomiting.  Patient's mother passed away yesterday from stroke.  She was 71 years old.  REVIEW OF SYSTEMS:   Constitutional: Denies fevers, chills or abnormal weight loss Eyes: Denies blurriness of vision Ears, nose, mouth, throat, and face: Denies mucositis or sore throat Respiratory: Denies cough, dyspnea or wheezes Cardiovascular: Denies palpitation, chest discomfort Gastrointestinal: Denies nausea, heartburn or change in bowel habits Skin: Denies abnormal skin rashes Lymphatics: Denies new lymphadenopathy or easy bruising Neurological: Denies numbness, tingling or new weaknesses Behavioral/Psych: Mood is stable, no new changes  Extremities: No lower extremity edema Breast: denies any pain or lumps or nodules in either breasts All other systems were reviewed with the patient and are negative.  I have reviewed the past medical history, past surgical history, social history and family history with the patient and they are unchanged from previous note.  ALLERGIES:  is allergic to adhesive [tape]; oxycodone hcl; augmentin [amoxicillin-pot clavulanate]; and xylocaine [lidocaine hcl].  MEDICATIONS:  Current Outpatient Medications  Medication Sig Dispense Refill  . acetaminophen (TYLENOL) 500 MG tablet Take 1,000 mg by mouth daily as needed for moderate pain or headache.     Marland Kitchen atorvastatin (LIPITOR) 20 MG tablet Take 20 mg by mouth daily.    . brimonidine-timolol (COMBIGAN) 0.2-0.5 % ophthalmic solution Place 1 drop into both eyes every 12 (twelve) hours.    Marland Kitchen CARTIA XT 120 MG 24 hr capsule TAKE ONE CAPSULE BY MOUTH DAILY, STOPPING METOPROLOL 90 capsule 3  . cetirizine (ZYRTEC) 10 MG tablet  Take 10 mg by mouth daily.    . Cholecalciferol (VITAMIN D3) 125 MCG (5000 UT) CAPS Take 5,000 Units by mouth at bedtime.     . Coenzyme Q10 10 MG capsule Take 10 mg by mouth at bedtime.    Marland Kitchen dexlansoprazole (DEXILANT) 60 MG capsule Take  60 mg by mouth daily.    . flecainide (TAMBOCOR) 100 MG tablet TAKE 1 TABLET BY MOUTH TWICE DAILY 180 tablet 0  . fluticasone (FLONASE) 50 MCG/ACT nasal spray Place 1 spray into both nostrils daily as needed for allergies.     . furosemide (LASIX) 20 MG tablet TAKE 1 TABLET(20 MG) BY MOUTH DAILY 90 tablet 0  . Krill Oil 500 MG CAPS Take 500 mg by mouth at bedtime.    . lidocaine-prilocaine (EMLA) cream Apply to affected area once 30 g 3  . LORazepam (ATIVAN) 0.5 MG tablet Take 1 tablet (0.5 mg total) by mouth at bedtime as needed (Nausea or vomiting). (Patient not taking: Reported on 12/20/2018) 30 tablet 0  . montelukast (SINGULAIR) 10 MG tablet Take 10 mg by mouth daily.    . Multiple Vitamin (MULTIVITAMIN) tablet Take 1 tablet by mouth at bedtime.     . ondansetron (ZOFRAN) 8 MG tablet Take 1 tablet (8 mg total) by mouth 2 (two) times daily as needed (Nausea or vomiting). (Patient not taking: Reported on 12/20/2018) 30 tablet 1  . Polyethyl Glycol-Propyl Glycol (SYSTANE OP) Place 1 drop into both eyes 2 (two) times daily as needed (dry eyes).    . prochlorperazine (COMPAZINE) 10 MG tablet TAKE 1 TABLET(10 MG) BY MOUTH EVERY 6 HOURS AS NEEDED FOR NAUSEA OR VOMITING (Patient not taking: No sig reported) 385 tablet 1  . quinapril (ACCUPRIL) 20 MG tablet TAKE 1 TABLET(20 MG) BY MOUTH EVERY MORNING 90 tablet 0  . traMADol (ULTRAM) 50 MG tablet Take 2 tablets (100 mg total) by mouth every 6 (six) hours as needed. (Patient not taking: Reported on 12/20/2018) 12 tablet 0   No current facility-administered medications for this visit.    Facility-Administered Medications Ordered in Other Visits  Medication Dose Route Frequency Provider Last Rate Last Dose  . sodium chloride flush (NS) 0.9 % injection 10 mL  10 mL Intracatheter Once Nicholas Lose, MD        PHYSICAL EXAMINATION: ECOG PERFORMANCE STATUS: 1 - Symptomatic but completely ambulatory  Vitals:   02/12/19 0848  BP: 118/73  Pulse: 60  Resp: 17   Temp: 98.2 F (36.8 C)  SpO2: 97%   Filed Weights   02/12/19 0848  Weight: 164 lb 12.8 oz (74.8 kg)    GENERAL: alert, no distress and comfortable SKIN: skin color, texture, turgor are normal, no rashes or significant lesions EYES: normal, Conjunctiva are pink and non-injected, sclera clear OROPHARYNX: no exudate, no erythema and lips, buccal mucosa, and tongue normal  NECK: supple, thyroid normal size, non-tender, without nodularity LYMPH: no palpable lymphadenopathy in the cervical, axillary or inguinal LUNGS: clear to auscultation and percussion with normal breathing effort HEART: regular rate & rhythm and no murmurs and no lower extremity edema ABDOMEN: abdomen soft, non-tender and normal bowel sounds MUSCULOSKELETAL: no cyanosis of digits and no clubbing  NEURO: alert & oriented x 3 with fluent speech, no focal motor/sensory deficits EXTREMITIES: No lower extremity edema  LABORATORY DATA:  I have reviewed the data as listed CMP Latest Ref Rng & Units 01/22/2019 01/01/2019 12/11/2018  Glucose 70 - 99 mg/dL 211(H) 205(H) 161(H)  BUN 8 -  23 mg/dL _0 Creatinine 0.44 - 1.00 mg/dL 0.81 0.78 0.73  Sodium 135 - 145 mmol/L 140 140 141  Potassium 3.5 - 5.1 mmol/L 4.1 4.1 4.0  Chloride 98 - 111 mmol/L 105 105 106  CO2 22 - 32 mmol/L _1 Calcium 8.9 - 10.3 mg/dL 8.9 9.0 8.9  Total Protein 6.5 - 8.1 g/dL 6.6 6.6 6.2(L)  Total Bilirubin 0.3 - 1.2 mg/dL 0.6 0.7 0.9  Alkaline Phos 38 - 126 U/L 89 96 106  AST 15 - 41 U/L 52(H) 53(H) 28  ALT 0 - 44 U/L 78(H) 67(H) 46(H)    Lab Results  Component Value Date   WBC 6.4 01/22/2019   HGB 14.4 01/22/2019   HCT 41.8 01/22/2019   MCV 100.5 (H) 01/22/2019   PLT 159 01/22/2019   NEUTROABS 3.5 01/22/2019    ASSESSMENT & PLAN:  Malignant neoplasm of upper-inner quadrant of left breast in female, estrogen receptor negative ([place]) 08/17/2018:Screening mammogram detected abnormality in the left breast by ultrasound irregular mass  10:00 position 1.3 cm, 10 o'clock position no enlarged lymph nodes, ER 0%, PR 0%, Ki-67 80%, HER-2 +3+ by IHC, grade 3, lymphovascular invasion present, T1c N0 stage Ia clinical stage  12/05/2018:Left lumpectomy: No residual invasive carcinoma, margins negative, 0/2 lymph nodes negative, complete pathologic response [id number] Adjuvant radiation therapy: 01/16/2019-02/11/2019  Treatment plan: Herceptin maintenance to complete 1 year of therapy. Labs reviewed Since the patient is ER negative there is no role of antiestrogen therapy.  Return to clinic every 3 weeks for Herceptin every 6 weeks for follow-up with me with labs   No orders of the defined types were placed in this encounter.  The patient has a good understanding of the overall plan. she agrees with it. she will call with any problems that may develop before the next visit here.  Nicholas Lose, MD 02/12/2019  Julious Oka Dorshimer am acting as scribe for Dr. Nicholas Lose.  I have reviewed the above documentation for accuracy and completeness, and I agree with the above.

## 2019-02-12 ENCOUNTER — Inpatient Hospital Stay: Payer: PRIVATE HEALTH INSURANCE

## 2019-02-12 ENCOUNTER — Other Ambulatory Visit: Payer: Self-pay

## 2019-02-12 ENCOUNTER — Telehealth: Payer: Self-pay | Admitting: *Deleted

## 2019-02-12 ENCOUNTER — Ambulatory Visit: Payer: PRIVATE HEALTH INSURANCE

## 2019-02-12 ENCOUNTER — Inpatient Hospital Stay (HOSPITAL_BASED_OUTPATIENT_CLINIC_OR_DEPARTMENT_OTHER): Payer: PRIVATE HEALTH INSURANCE | Admitting: Hematology and Oncology

## 2019-02-12 DIAGNOSIS — Z171 Estrogen receptor negative status [ER-]: Secondary | ICD-10-CM

## 2019-02-12 DIAGNOSIS — C50212 Malignant neoplasm of upper-inner quadrant of left female breast: Secondary | ICD-10-CM

## 2019-02-12 DIAGNOSIS — Z5112 Encounter for antineoplastic immunotherapy: Secondary | ICD-10-CM | POA: Diagnosis not present

## 2019-02-12 DIAGNOSIS — Z95828 Presence of other vascular implants and grafts: Secondary | ICD-10-CM

## 2019-02-12 DIAGNOSIS — Z79899 Other long term (current) drug therapy: Secondary | ICD-10-CM | POA: Diagnosis not present

## 2019-02-12 LAB — CMP (CANCER CENTER ONLY)
ALT: 95 U/L — ABNORMAL HIGH (ref 0–44)
AST: 61 U/L — ABNORMAL HIGH (ref 15–41)
Albumin: 3.4 g/dL — ABNORMAL LOW (ref 3.5–5.0)
Alkaline Phosphatase: 86 U/L (ref 38–126)
Anion gap: 10 (ref 5–15)
BUN: 13 mg/dL (ref 8–23)
CO2: 24 mmol/L (ref 22–32)
Calcium: 8.7 mg/dL — ABNORMAL LOW (ref 8.9–10.3)
Chloride: 103 mmol/L (ref 98–111)
Creatinine: 0.8 mg/dL (ref 0.44–1.00)
GFR, Est AFR Am: >60 mL/min (ref >60)
GFR, Estimated: >60 mL/min (ref >60)
Glucose, Bld: 241 mg/dL — ABNORMAL HIGH (ref 70–99)
Potassium: 4 mmol/L (ref 3.5–5.1)
Sodium: 137 mmol/L (ref 135–145)
Total Bilirubin: 0.6 mg/dL (ref 0.3–1.2)
Total Protein: 6.3 g/dL — ABNORMAL LOW (ref 6.5–8.1)

## 2019-02-12 LAB — CBC WITH DIFFERENTIAL (CANCER CENTER ONLY)
Abs Immature Granulocytes: 0.01 10*3/uL (ref 0.00–0.07)
Basophils Absolute: 0 10*3/uL (ref 0.0–0.1)
Basophils Relative: 1 %
Eosinophils Absolute: 1 10*3/uL — ABNORMAL HIGH (ref 0.0–0.5)
Eosinophils Relative: 18 %
HCT: 41.6 % (ref 36.0–46.0)
Hemoglobin: 14.6 g/dL (ref 12.0–15.0)
Immature Granulocytes: 0 %
Lymphocytes Relative: 13 %
Lymphs Abs: 0.7 10*3/uL (ref 0.7–4.0)
MCH: 34 pg (ref 26.0–34.0)
MCHC: 35.1 g/dL (ref 30.0–36.0)
MCV: 96.7 fL (ref 80.0–100.0)
Monocytes Absolute: 0.6 10*3/uL (ref 0.1–1.0)
Monocytes Relative: 10 %
Neutro Abs: 3.2 10*3/uL (ref 1.7–7.7)
Neutrophils Relative %: 58 %
Platelet Count: 134 10*3/uL — ABNORMAL LOW (ref 150–400)
RBC: 4.3 MIL/uL (ref 3.87–5.11)
RDW: 11.5 % (ref 11.5–15.5)
WBC Count: 5.5 10*3/uL (ref 4.0–10.5)
nRBC: 0 % (ref 0.0–0.2)

## 2019-02-12 MED ORDER — DIPHENHYDRAMINE HCL 50 MG/ML IJ SOLN
25.0000 mg | Freq: Once | INTRAMUSCULAR | Status: AC
  Administered 2019-02-12: 25 mg via INTRAVENOUS

## 2019-02-12 MED ORDER — SODIUM CHLORIDE 0.9% FLUSH
10.0000 mL | Freq: Once | INTRAVENOUS | Status: AC
  Administered 2019-02-12: 10 mL
  Filled 2019-02-12: qty 10

## 2019-02-12 MED ORDER — SODIUM CHLORIDE 0.9 % IV SOLN
Freq: Once | INTRAVENOUS | Status: AC
  Administered 2019-02-12: 09:00:00 via INTRAVENOUS
  Filled 2019-02-12: qty 250

## 2019-02-12 MED ORDER — DIPHENHYDRAMINE HCL 50 MG/ML IJ SOLN
INTRAMUSCULAR | Status: AC
  Filled 2019-02-12: qty 1

## 2019-02-12 MED ORDER — TRASTUZUMAB CHEMO 150 MG IV SOLR
450.0000 mg | Freq: Once | INTRAVENOUS | Status: AC
  Administered 2019-02-12: 10:00:00 450 mg via INTRAVENOUS
  Filled 2019-02-12: qty 21.43

## 2019-02-12 MED ORDER — ACETAMINOPHEN 325 MG PO TABS
ORAL_TABLET | ORAL | Status: AC
  Filled 2019-02-12: qty 2

## 2019-02-12 MED ORDER — ACETAMINOPHEN 325 MG PO TABS
650.0000 mg | ORAL_TABLET | Freq: Once | ORAL | Status: AC
  Administered 2019-02-12: 650 mg via ORAL

## 2019-02-12 MED ORDER — SODIUM CHLORIDE 0.9% FLUSH
10.0000 mL | INTRAVENOUS | Status: DC | PRN
  Administered 2019-02-12: 10 mL
  Filled 2019-02-12: qty 10

## 2019-02-12 MED ORDER — HEPARIN SOD (PORK) LOCK FLUSH 100 UNIT/ML IV SOLN
500.0000 [IU] | Freq: Once | INTRAVENOUS | Status: AC | PRN
  Administered 2019-02-12: 500 [IU]
  Filled 2019-02-12: qty 5

## 2019-02-12 NOTE — Telephone Encounter (Signed)
Left message for patient to follow up and see how she is doing.

## 2019-02-12 NOTE — Patient Instructions (Signed)

## 2019-02-12 NOTE — Patient Instructions (Signed)
[place] Discharge Instructions for Patients Receiving Chemotherapy  Today you received the following chemotherapy agents Herceptin  To help prevent nausea and vomiting after your treatment, we encourage you to take your nausea medication as directed by MD   If you develop nausea and vomiting that is not controlled by your nausea medication, call the clinic.   BELOW ARE SYMPTOMS THAT SHOULD BE REPORTED IMMEDIATELY:  *FEVER GREATER THAN 100.5 F  *CHILLS WITH OR WITHOUT FEVER  NAUSEA AND VOMITING THAT IS NOT CONTROLLED WITH YOUR NAUSEA MEDICATION  *UNUSUAL SHORTNESS OF BREATH  *UNUSUAL BRUISING OR BLEEDING  TENDERNESS IN MOUTH AND THROAT WITH OR WITHOUT PRESENCE OF ULCERS  *URINARY PROBLEMS  *BOWEL PROBLEMS  UNUSUAL RASH Items with * indicate a potential emergency and should be followed up as soon as possible.  Feel free to call the clinic should you have any questions or concerns. The clinic phone number is [phone].  Please show the [place] at check-in to the Emergency Department and triage nurse.

## 2019-02-18 ENCOUNTER — Other Ambulatory Visit: Payer: Self-pay | Admitting: General Surgery

## 2019-02-18 ENCOUNTER — Other Ambulatory Visit (HOSPITAL_COMMUNITY): Payer: Self-pay | Admitting: Internal Medicine

## 2019-02-18 DIAGNOSIS — N6489 Other specified disorders of breast: Secondary | ICD-10-CM

## 2019-02-19 ENCOUNTER — Ambulatory Visit
Admission: RE | Admit: 2019-02-19 | Discharge: 2019-02-19 | Disposition: A | Discharge status: Home / Self Care | Payer: PRIVATE HEALTH INSURANCE | Source: Ambulatory Visit | Attending: General Surgery | Admitting: General Surgery

## 2019-02-19 ENCOUNTER — Other Ambulatory Visit: Payer: Self-pay

## 2019-02-19 DIAGNOSIS — N6489 Other specified disorders of breast: Secondary | ICD-10-CM

## 2019-02-19 HISTORY — DX: Personal history of antineoplastic chemotherapy: Z92.21

## 2019-02-19 HISTORY — DX: Personal history of irradiation: Z92.3

## 2019-02-21 ENCOUNTER — Other Ambulatory Visit: Payer: Self-pay | Admitting: General Surgery

## 2019-02-21 DIAGNOSIS — N6489 Other specified disorders of breast: Secondary | ICD-10-CM

## 2019-02-23 ENCOUNTER — Other Ambulatory Visit: Payer: Self-pay | Admitting: Cardiovascular Disease

## 2019-02-25 ENCOUNTER — Other Ambulatory Visit: Payer: Self-pay | Admitting: Cardiovascular Disease

## 2019-02-28 ENCOUNTER — Ambulatory Visit
Admission: RE | Admit: 2019-02-28 | Discharge: 2019-02-28 | Disposition: A | Discharge status: Home / Self Care | Payer: PRIVATE HEALTH INSURANCE | Source: Ambulatory Visit | Attending: General Surgery | Admitting: General Surgery

## 2019-02-28 ENCOUNTER — Other Ambulatory Visit: Payer: Self-pay

## 2019-02-28 DIAGNOSIS — N6489 Other specified disorders of breast: Secondary | ICD-10-CM

## 2019-03-05 ENCOUNTER — Inpatient Hospital Stay: Payer: PRIVATE HEALTH INSURANCE

## 2019-03-05 ENCOUNTER — Other Ambulatory Visit: Payer: Self-pay

## 2019-03-05 ENCOUNTER — Inpatient Hospital Stay: Payer: PRIVATE HEALTH INSURANCE | Attending: Hematology and Oncology

## 2019-03-05 VITALS — BP 129/89 | HR 51 | Temp 98.2°F | Resp 18

## 2019-03-05 DIAGNOSIS — Z95828 Presence of other vascular implants and grafts: Secondary | ICD-10-CM

## 2019-03-05 DIAGNOSIS — C50212 Malignant neoplasm of upper-inner quadrant of left female breast: Secondary | ICD-10-CM

## 2019-03-05 DIAGNOSIS — Z5112 Encounter for antineoplastic immunotherapy: Secondary | ICD-10-CM | POA: Diagnosis present

## 2019-03-05 LAB — CBC WITH DIFFERENTIAL (CANCER CENTER ONLY)
Abs Immature Granulocytes: 0.02 10*3/uL (ref 0.00–0.07)
Basophils Absolute: 0 10*3/uL (ref 0.0–0.1)
Basophils Relative: 0 %
Eosinophils Absolute: 0.3 10*3/uL (ref 0.0–0.5)
Eosinophils Relative: 7 %
HCT: 40.7 % (ref 36.0–46.0)
Hemoglobin: 14.4 g/dL (ref 12.0–15.0)
Immature Granulocytes: 0 %
Lymphocytes Relative: 20 %
Lymphs Abs: 1 10*3/uL (ref 0.7–4.0)
MCH: 34.1 pg — ABNORMAL HIGH (ref 26.0–34.0)
MCHC: 35.4 g/dL (ref 30.0–36.0)
MCV: 96.4 fL (ref 80.0–100.0)
Monocytes Absolute: 0.6 10*3/uL (ref 0.1–1.0)
Monocytes Relative: 12 %
Neutro Abs: 3 10*3/uL (ref 1.7–7.7)
Neutrophils Relative %: 61 %
Platelet Count: 131 10*3/uL — ABNORMAL LOW (ref 150–400)
RBC: 4.22 MIL/uL (ref 3.87–5.11)
RDW: 11.8 % (ref 11.5–15.5)
WBC Count: 4.9 10*3/uL (ref 4.0–10.5)
nRBC: 0 % (ref 0.0–0.2)

## 2019-03-05 LAB — CMP (CANCER CENTER ONLY)
ALT: 109 U/L — ABNORMAL HIGH (ref 0–44)
AST: 76 U/L — ABNORMAL HIGH (ref 15–41)
Albumin: 3.4 g/dL — ABNORMAL LOW (ref 3.5–5.0)
Alkaline Phosphatase: 84 U/L (ref 38–126)
Anion gap: 10 (ref 5–15)
BUN: 13 mg/dL (ref 8–23)
CO2: 25 mmol/L (ref 22–32)
Calcium: 8.7 mg/dL — ABNORMAL LOW (ref 8.9–10.3)
Chloride: 105 mmol/L (ref 98–111)
Creatinine: 0.75 mg/dL (ref 0.44–1.00)
GFR, Est AFR Am: >60 mL/min (ref >60)
GFR, Estimated: >60 mL/min (ref >60)
Glucose, Bld: 149 mg/dL — ABNORMAL HIGH (ref 70–99)
Potassium: 4.2 mmol/L (ref 3.5–5.1)
Sodium: 140 mmol/L (ref 135–145)
Total Bilirubin: 0.6 mg/dL (ref 0.3–1.2)
Total Protein: 6 g/dL — ABNORMAL LOW (ref 6.5–8.1)

## 2019-03-05 MED ORDER — SODIUM CHLORIDE 0.9 % IV SOLN
Freq: Once | INTRAVENOUS | Status: AC
  Administered 2019-03-05: 09:00:00 via INTRAVENOUS
  Filled 2019-03-05: qty 250

## 2019-03-05 MED ORDER — TRASTUZUMAB CHEMO 150 MG IV SOLR
450.0000 mg | Freq: Once | INTRAVENOUS | Status: AC
  Administered 2019-03-05: 450 mg via INTRAVENOUS
  Filled 2019-03-05: qty 21.43

## 2019-03-05 MED ORDER — DIPHENHYDRAMINE HCL 50 MG/ML IJ SOLN
25.0000 mg | Freq: Once | INTRAMUSCULAR | Status: AC
  Administered 2019-03-05: 25 mg via INTRAVENOUS

## 2019-03-05 MED ORDER — SODIUM CHLORIDE 0.9% FLUSH
10.0000 mL | Freq: Once | INTRAVENOUS | Status: AC
  Administered 2019-03-05: 10 mL
  Filled 2019-03-05: qty 10

## 2019-03-05 MED ORDER — HEPARIN SOD (PORK) LOCK FLUSH 100 UNIT/ML IV SOLN
500.0000 [IU] | Freq: Once | INTRAVENOUS | Status: AC | PRN
  Administered 2019-03-05: 500 [IU]
  Filled 2019-03-05: qty 5

## 2019-03-05 MED ORDER — SODIUM CHLORIDE 0.9% FLUSH
10.0000 mL | INTRAVENOUS | Status: DC | PRN
  Administered 2019-03-05: 10 mL
  Filled 2019-03-05: qty 10

## 2019-03-05 MED ORDER — ACETAMINOPHEN 325 MG PO TABS
ORAL_TABLET | ORAL | Status: AC
  Filled 2019-03-05: qty 2

## 2019-03-05 MED ORDER — DIPHENHYDRAMINE HCL 50 MG/ML IJ SOLN
INTRAMUSCULAR | Status: AC
  Filled 2019-03-05: qty 1

## 2019-03-05 MED ORDER — ACETAMINOPHEN 325 MG PO TABS
650.0000 mg | ORAL_TABLET | Freq: Once | ORAL | Status: AC
  Administered 2019-03-05: 650 mg via ORAL

## 2019-03-05 NOTE — Patient Instructions (Signed)
[place] Discharge Instructions for Patients Receiving Chemotherapy  Today you received the following chemotherapy agents Herceptin  To help prevent nausea and vomiting after your treatment, we encourage you to take your nausea medication as directed by MD   If you develop nausea and vomiting that is not controlled by your nausea medication, call the clinic.   BELOW ARE SYMPTOMS THAT SHOULD BE REPORTED IMMEDIATELY:  *FEVER GREATER THAN 100.5 F  *CHILLS WITH OR WITHOUT FEVER  NAUSEA AND VOMITING THAT IS NOT CONTROLLED WITH YOUR NAUSEA MEDICATION  *UNUSUAL SHORTNESS OF BREATH  *UNUSUAL BRUISING OR BLEEDING  TENDERNESS IN MOUTH AND THROAT WITH OR WITHOUT PRESENCE OF ULCERS  *URINARY PROBLEMS  *BOWEL PROBLEMS  UNUSUAL RASH Items with * indicate a potential emergency and should be followed up as soon as possible.  Feel free to call the clinic should you have any questions or concerns. The clinic phone number is [phone].  Please show the [place] at check-in to the Emergency Department and triage nurse.

## 2019-03-05 NOTE — Progress Notes (Signed)
Per Dr. Lindi Adie ok to treat with ALT 109.

## 2019-03-05 NOTE — Patient Instructions (Signed)

## 2019-03-12 NOTE — Progress Notes (Signed)
  Radiation Oncology         [phone]) [phone] ________________________________  Name: Andrea Clarke MRN: [id number]  Date: 02/11/2019  DOB: 13-Oct-1946  End of Treatment Note  Diagnosis:   73 y.o. 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 This was delivered using a 3-D conformal technique. The patient then received a boost to the seroma. This delivered an additional 8 Gy in 4 fractions using a 3 field photon technique due to the depth of the seroma. The total dose was 50.56 Gy.  Narrative: The patient tolerated radiation treatment relatively well.   The patient had some expected skin irritation as she progressed during treatment. Moist desquamation was not present at the end of treatment. She is applying Radiaplex gel to her skin as ordered and hydrocortisone cream as needed for pruritis. She also noted mild fatigue.  Plan: The patient has completed radiation treatment. The patient will return to radiation oncology clinic for routine followup in one month. I advised the patient to call or return sooner if they have any questions or concerns related to their recovery or treatment. ________________________________  Jodelle Gross, MD, PhD  This document serves as a record of services personally performed by Kyung Rudd, MD. It was created on his behalf by Rae Lips, a trained medical scribe. The creation of this record is based on the scribe's personal observations and the provider's statements to them. This document has been checked and approved by the attending provider.

## 2019-03-14 ENCOUNTER — Encounter (HOSPITAL_COMMUNITY): Payer: PRIVATE HEALTH INSURANCE | Admitting: Internal Medicine

## 2019-03-14 ENCOUNTER — Ambulatory Visit (HOSPITAL_COMMUNITY)
Admission: RE | Admit: 2019-03-14 | Discharge: 2019-03-14 | Disposition: A | Discharge status: Home / Self Care | Payer: PRIVATE HEALTH INSURANCE | Source: Ambulatory Visit | Attending: Internal Medicine | Admitting: Internal Medicine

## 2019-03-14 ENCOUNTER — Other Ambulatory Visit: Payer: Self-pay

## 2019-03-14 ENCOUNTER — Telehealth: Payer: Self-pay | Admitting: Radiation Oncology

## 2019-03-14 DIAGNOSIS — C50212 Malignant neoplasm of upper-inner quadrant of left female breast: Secondary | ICD-10-CM | POA: Insufficient documentation

## 2019-03-14 DIAGNOSIS — I340 Nonrheumatic mitral (valve) insufficiency: Secondary | ICD-10-CM | POA: Diagnosis not present

## 2019-03-14 DIAGNOSIS — Z01818 Encounter for other preprocedural examination: Secondary | ICD-10-CM

## 2019-03-14 DIAGNOSIS — Z171 Estrogen receptor negative status [ER-]: Secondary | ICD-10-CM | POA: Diagnosis not present

## 2019-03-14 NOTE — Telephone Encounter (Signed)
  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  Interval Since Last Radiation:  4 weeks   01/15/2019 - 02/11/2019: The patient initially received a dose of 42.56 Gy in 16 fractions to the left breast using whole-breast tangent fields. This was delivered using a 3-D conformal technique. The patient then received a boost to the seroma. This delivered an additional 8 Gy in 4 fractions using a 3 field photon technique due to the depth of the seroma. The total dose was 50.56 Gy.  Narrative:  The patient was contacted today for routine follow-up. During treatment she did very well with radiotherapy and did not have significant desquamation.  Impression/Plan: 1. 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 I had to leave a message and asked her to call back if she'd like to talk about skin care. She will also continue to follow up with Dr. Lindi Adie in medical oncology as she receives herceptin and for surveillance. She was counseled on skin care as well as measures to avoid sun exposure to this area.  2. Survivorship. I also discussed the importance of survivorship evaluation and left the phone number for Ottis Stain [phone]) if she wanted to be added to the list to receive the monthly resource calendar for the cancer center.     Carola Rhine, PAC

## 2019-03-19 ENCOUNTER — Encounter: Payer: Self-pay | Admitting: Physical Therapy

## 2019-03-19 ENCOUNTER — Ambulatory Visit: Payer: PRIVATE HEALTH INSURANCE | Attending: General Surgery | Admitting: Physical Therapy

## 2019-03-19 ENCOUNTER — Other Ambulatory Visit: Payer: Self-pay

## 2019-03-19 DIAGNOSIS — R293 Abnormal posture: Secondary | ICD-10-CM | POA: Diagnosis present

## 2019-03-19 DIAGNOSIS — Z483 Aftercare following surgery for neoplasm: Secondary | ICD-10-CM

## 2019-03-19 DIAGNOSIS — M25612 Stiffness of left shoulder, not elsewhere classified: Secondary | ICD-10-CM | POA: Diagnosis present

## 2019-03-19 DIAGNOSIS — L599 Disorder of the skin and subcutaneous tissue related to radiation, unspecified: Secondary | ICD-10-CM

## 2019-03-19 DIAGNOSIS — I890 Lymphedema, not elsewhere classified: Secondary | ICD-10-CM

## 2019-03-19 DIAGNOSIS — M79602 Pain in left arm: Secondary | ICD-10-CM

## 2019-03-19 NOTE — Therapy (Signed)
[place], [place], [address] Phone: [phone]   Fax:  [phone]  Physical Therapy Evaluation  Patient Details  Name: Andrea Clarke MRN: [id number] Date of Birth: 26-Nov-1945 Referring Provider (PT): Ave Filter Date: 03/19/2019  PT End of Session - 03/19/19 0920    Visit Number  1    Number of Visits  9    Date for PT Re-Evaluation  04/16/19    PT Start Time  0831    PT Stop Time  0916    PT Time Calculation (min)  45 min    Activity Tolerance  Patient tolerated treatment well    Behavior During Therapy  [facility] for tasks assessed/performed       Past Medical History:  Diagnosis Date  . Atrial tachycardia ([place])   . Breast cancer ([place])    left breast cancer 2019  . 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 Allegra Lai)  . GERD (gastroesophageal reflux disease)   . Glaucoma   . Hyperlipidemia   . Hypertension   . Personal history of chemotherapy   . Personal history of radiation therapy    finished 02/11/19  . PONV (postoperative nausea and vomiting)     Past Surgical History:  Procedure Laterality Date  . ABDOMINAL HYSTERECTOMY    . ANKLE SURGERY    . APPENDECTOMY    . BREAST BIOPSY     Left  . BREAST LUMPECTOMY Left 12/05/2018  . BREAST LUMPECTOMY WITH RADIOACTIVE SEED AND SENTINEL LYMPH NODE BIOPSY N/A 12/05/2018   Procedure: LEFT BREAST LUMPECTOMY WITH RADIOACTIVE SEED AND LEFT AXILLARY SENTINEL LYMPH NODE BIOPSY AND BLUE DYE INJECTION;  Surgeon: Rolm Bookbinder, MD;  Location: [place];  Service: General;  Laterality: N/A;  . CATARACT EXTRACTION W/ INTRAOCULAR LENS IMPLANT Bilateral   . PORTACATH PLACEMENT Right 09/03/2018   Procedure: INSERTION PORT-A-CATH WITH [place];  Surgeon: Rolm Bookbinder, MD;  Location: [place];  Service: General;  Laterality: Right;  . ROTATOR CUFF REPAIR Right   . TONSILLECTOMY      There were no vitals  filed for this visit.   Subjective Assessment - 03/19/19 0838    Subjective  I am having pain and swelling in my arm. The swelling has been there 10 days the tightness has been there since a month after surgery. I have seroma. I have a seroma and it has been aspirated 9 times and is back.     Pertinent History  left breast cancer 12/05/18- left lumpectomy 0/2 nodes negative, 09-03-18- chemotherapy, radiation 01/26/19-02/11/19, right rotator cuff sugery    Patient Stated Goals  to get back to playing golf and doing things I like without pain    Currently in Pain?  Yes    Pain Score  8     Pain Location  Arm    Pain Orientation  Left    Pain Descriptors / Indicators  Sore    Pain Type  Acute pain    Pain Radiating Towards  arm down to wrist    Pain Onset  1 to 4 weeks ago    Pain Frequency  Constant    Aggravating Factors   raising arm overhead    Pain Relieving Factors  nothing    Effect of Pain on Daily Activities  unable to move armoverhead         [facility] PT Assessment - 03/19/19 0001      Assessment  Medical Diagnosis  left breast cancer    Referring Provider (PT)  Donne Hazel    Onset Date/Surgical Date  12/05/18    Hand Dominance  Right    Prior Therapy  none      Precautions   Precautions  None      Restrictions   Weight Bearing Restrictions  No      Balance Screen   Has the patient fallen in the past 6 months  No    Has the patient had a decrease in activity level because of a fear of falling?   No    Is the patient reluctant to leave their home because of a fear of falling?   No      Home Environment   Living Environment  Private residence    Living Arrangements  Spouse/significant other    Available Help at Discharge  Family    Type of [place]      Prior Function   Level of [place] with basic ADLs   needs assist with cleaning   Vocation  Full time employment    Vocation Requirements  desk work, computer work    Leisure  pt states she does  not exercise      Cognition   Overall Cognitive Status  Within Functional Limits for tasks assessed      Observation/Other Assessments   Observations  left knuckles less visible      Posture/Postural Control   Posture/Postural Control  Postural limitations    Postural Limitations  Forward head;Rounded Shoulders      ROM / Strength   AROM / PROM / Strength  AROM      AROM   AROM Assessment Site  Shoulder    Right/Left Shoulder  Right;Left    Right Shoulder Flexion  171 Degrees    Right Shoulder ABduction  177 Degrees    Right Shoulder Internal Rotation  49 Degrees    Right Shoulder External Rotation  63 Degrees    Left Shoulder Flexion  138 Degrees    Left Shoulder ABduction  129 Degrees    Left Shoulder Internal Rotation  70 Degrees    Left Shoulder External Rotation  70 Degrees        LYMPHEDEMA/ONCOLOGY QUESTIONNAIRE - 03/19/19 0851      Type   Cancer Type  left breast cancer      Surgeries   Lumpectomy Date  12/05/18    Sentinel Lymph Node Biopsy Date  12/05/18    Number Lymph Nodes Removed  2      Date Lymphedema/Swelling Started   Date  03/11/19      Treatment   Active Chemotherapy Treatment  No    Past Chemotherapy Treatment  Yes    Date  09/03/18    Active Radiation Treatment  No    Past Radiation Treatment  Yes    Date  02/11/19    Current Hormone Treatment  No    Past Hormone Therapy  No      What other symptoms do you have   Are you Having Heaviness or Tightness  Yes    Are you having Pain  Yes    Are you having pitting edema  No    Is it Hard or Difficult finding clothes that fit  No    Do you have infections  Yes    Is there Decreased scar mobility  No      Lymphedema Assessments   Lymphedema Assessments  Upper extremities      Right Upper Extremity Lymphedema   15 cm Proximal to Olecranon Process  28 cm    Olecranon Process  24 cm    15 cm Proximal to Ulnar Styloid Process  23.5 cm    Just Proximal to Ulnar Styloid Process  15 cm     Across Hand at [employer]  18.1 cm    At [place] of 2nd Digit  6.5 cm      Left Upper Extremity Lymphedema   15 cm Proximal to Olecranon Process  29.2 cm    Olecranon Process  23.8 cm    15 cm Proximal to Ulnar Styloid Process  23.4 cm    Just Proximal to Ulnar Styloid Process  15.1 cm    Across Hand at [employer]  18.6 cm    At [place] of 2nd Digit  6.3 cm          Quick Dash - 03/19/19 0001    Open a tight or new jar  Moderate difficulty    Do heavy household chores (wash walls, wash floors)  Moderate difficulty    Carry a shopping bag or briefcase  Severe difficulty    Wash your back  No difficulty    Use a knife to cut food  No difficulty    Recreational activities in which you take some force or impact through your arm, shoulder, or hand (golf, hammering, tennis)  Unable    During the past week, to what extent has your arm, shoulder or hand problem interfered with your normal social activities with family, friends, neighbors, or groups?  Quite a bit    During the past week, to what extent has your arm, shoulder or hand problem limited your work or other regular daily activities  Modererately    Arm, shoulder, or hand pain.  None    Tingling (pins and needles) in your arm, shoulder, or hand  Mild    Difficulty Sleeping  No difficulty    DASH Score  38.64 %        Outpatient Re[facility]19/[facility]  Lymphedema Life Impact Scale Total Score  51.47 %      Objective measurements completed on examination: See above findings.      [place] Adult PT Treatment/Exercise - 03/19/19 0001      Shoulder Exercises: Supine   Flexion  AAROM;5 reps   with dowel with 5 sec holds   ABduction  AAROM;Left;5 reps   with dowel with 5 sec holds            PT Education - 03/19/19 0920    Education Details  anatomy and physiology of lymphatic system, lymphedema risk reduction practices    Person(s) Educated  Patient    Methods   Explanation;Handout    Comprehension  Verbalized understanding          PT Long Term Goals - 03/19/19 1026      PT LONG TERM GOAL #1   Title  Pt will be indepdent in self MLD for managmement of LUE swelling.    Time  4    Period  Weeks    Status  New    Target Date  04/16/19      PT LONG TERM GOAL #2   Title  Pt will obtain appropriate compression garments for long term management of lymphedema.    Time  4    Period  Weeks  Status  New    Target Date  04/16/19      PT LONG TERM GOAL #3   Title  Pt will report 0/10 pain in left arm due to cording to allow her to clean her house without discomfort.    Time  4    Period  Weeks    Status  New    Target Date  04/16/19      PT LONG TERM GOAL #4   Title  Pt will be independent with a home exercise program for continued strengthening and stretching.    Time  4    Period  Weeks    Status  New    Target Date  04/16/19      PT LONG TERM GOAL #5   Title  Pt will demonstate 165 degrees of left shoulder flexion and abduction to allow her to returnto prior level of function.    Baseline  L abd 129, L flex 138    Time  4    Period  Weeks    Status  New    Target Date  04/16/19             Plan - 03/19/19 6834    Clinical Impression Statement  Patient presents to PT following left lumpectomy, chemo and radiation for treatment of left breast cancer. Her lumpectomy took place on 12/05/18 and she had 2 sentinel nodes biopsied. She had tightness throughout her left arm starting a month after surgery. She has decreased left shoulder ROM cording throughout LUE. She developed a seroma in left axilla and has had it drained 9x. Her mother recently passed and she has been cleaning out her place and has been doing heavy lifting and a week ago developed swelling in her left UE though today it was more controlled. She would benefit from skilled PT services to improve left shoulder ROM, decrease LUE cording, instruct pt in self MLD for LUE  and assist pt in obtaining a compression sleeve.     Personal Factors and Comorbidities  Other   hx of chemo and radiation   Examination-Activity Limitations  Lift;Reach Overhead;Carry    Examination-Participation Restrictions  Cleaning    Stability/Clinical Decision Making  Evolving/Moderate complexity    Clinical Decision Making  Moderate    Rehab Potential  Good    PT Frequency  2x / week    PT Duration  4 weeks    PT Treatment/Interventions  ADLs/Self Care Home Management;Therapeutic exercise;Manual techniques;Orthotic Fit/Training;Patient/family education;Manual lymph drainage;Compression bandaging;Scar mobilization;Passive range of motion;Taping    PT Next Visit Plan  make chip pack for seroma, begin PROM to L and myofascial to cording, work on hard area on inferior left breast, teach self MLD    PT Home Exercise Plan  supine dowel exercises    Recommended Other Services  sent email to [facility] to try and get pt measured for sleeve on Wednesday    Consulted and Agree with Plan of Care  Patient       Patient will benefit from skilled therapeutic intervention in order to improve the following deficits and impairments:  Increased fascial restricitons, Pain, Postural dysfunction, Impaired UE functional use, Decreased strength, Decreased range of motion, Increased edema  Visit Diagnosis: Stiffness of left shoulder, not elsewhere classified  Lymphedema, not elsewhere classified  Pain in left arm  Disorder of the skin and subcutaneous tissue related to radiation, unspecified  Aftercare following surgery for neoplasm  Abnormal posture     Problem List  Patient Active Problem List   Diagnosis Date Noted  . Port-A-Cath in place 08/31/2018  . Malignant neoplasm of upper-inner quadrant of left breast in female, estrogen receptor negative ([place]) 08/28/2018  . Hypertension   . Hyperlipidemia   . Glaucoma   . GERD (gastroesophageal reflux disease)   . HYPERLIPIDEMIA 06/11/2007  .  GLAUCOMA NOS 06/11/2007  . HTN (hypertension) 06/11/2007  . ALLERGIC RHINITIS 06/11/2007  . GERD 06/11/2007  . HERPES ZOSTER, UNCOMPLICATED 30/86/5784    Allyson Sabal [facility] 03/19/2019, 10:29 AM  [place], [place], [address] Phone: [phone]   Fax:  [phone]  Name: Andrea Clarke MRN: [id number] Date of Birth: [birth year]  Manus Gunning, PT 03/19/19 10:29 AM

## 2019-03-19 NOTE — Patient Instructions (Signed)
Shoulder: Flexion (Supine)    With hands shoulder width apart, slowly lower dowel to floor behind head. Do not let elbows bend. Keep back flat. Hold _5-15___ seconds. Repeat _10___ times. Do __2__ sessions per day. CAUTION: Stretch slowly and gently.  Copyright  VHI. All rights reserved.  Shoulder: Abduction (Supine)    With left arm flat on floor, hold dowel in palm. Slowly move arm up to side of head by pushing with opposite arm. Do not let elbow bend. Hold _5-15___ seconds. Repeat _10___ times. Do _2___ sessions per day. CAUTION: Stretch slowly and gently.  Copyright  VHI. All rights reserved.   

## 2019-03-21 ENCOUNTER — Other Ambulatory Visit: Payer: Self-pay

## 2019-03-21 ENCOUNTER — Ambulatory Visit: Payer: PRIVATE HEALTH INSURANCE | Admitting: Physical Therapy

## 2019-03-21 ENCOUNTER — Encounter: Payer: Self-pay | Admitting: Physical Therapy

## 2019-03-21 DIAGNOSIS — M25612 Stiffness of left shoulder, not elsewhere classified: Secondary | ICD-10-CM

## 2019-03-21 DIAGNOSIS — M79602 Pain in left arm: Secondary | ICD-10-CM

## 2019-03-21 DIAGNOSIS — L599 Disorder of the skin and subcutaneous tissue related to radiation, unspecified: Secondary | ICD-10-CM

## 2019-03-21 DIAGNOSIS — I890 Lymphedema, not elsewhere classified: Secondary | ICD-10-CM

## 2019-03-21 NOTE — Therapy (Signed)
[place], [place], [address] Phone: [phone]   Fax:  [phone]  Physical Therapy Treatment  Patient Details  Name: Andrea Clarke MRN: [id number] Date of Birth: August 05, 1946 Referring Provider (PT): Ave Filter Date: 03/21/2019  PT End of Session - 03/21/19 1028    Visit Number  2    Number of Visits  9    Date for PT Re-Evaluation  04/16/19    PT Start Time  0932    PT Stop Time  1016    PT Time Calculation (min)  44 min    Activity Tolerance  Patient tolerated treatment well    Behavior During Therapy  [facility] for tasks assessed/performed       Past Medical History:  Diagnosis Date  . Atrial tachycardia ([place])   . Breast cancer ([place])    left breast cancer 2019  . 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 Allegra Lai)  . GERD (gastroesophageal reflux disease)   . Glaucoma   . Hyperlipidemia   . Hypertension   . Personal history of chemotherapy   . Personal history of radiation therapy    finished 02/11/19  . PONV (postoperative nausea and vomiting)     Past Surgical History:  Procedure Laterality Date  . ABDOMINAL HYSTERECTOMY    . ANKLE SURGERY    . APPENDECTOMY    . BREAST BIOPSY     Left  . BREAST LUMPECTOMY Left 12/05/2018  . BREAST LUMPECTOMY WITH RADIOACTIVE SEED AND SENTINEL LYMPH NODE BIOPSY N/A 12/05/2018   Procedure: LEFT BREAST LUMPECTOMY WITH RADIOACTIVE SEED AND LEFT AXILLARY SENTINEL LYMPH NODE BIOPSY AND BLUE DYE INJECTION;  Surgeon: Rolm Bookbinder, MD;  Location: [place];  Service: General;  Laterality: N/A;  . CATARACT EXTRACTION W/ INTRAOCULAR LENS IMPLANT Bilateral   . PORTACATH PLACEMENT Right 09/03/2018   Procedure: INSERTION PORT-A-CATH WITH [place];  Surgeon: Rolm Bookbinder, MD;  Location: [place];  Service: General;  Laterality: Right;  . ROTATOR CUFF REPAIR Right   . TONSILLECTOMY      There were no vitals  filed for this visit.  Subjective Assessment - 03/21/19 0936    Subjective  My swelling is not good today. I do not have knuckles today.    Pertinent History  left breast cancer 12/05/18- left lumpectomy 0/2 nodes negative, 09-03-18- chemotherapy, radiation 01/26/19-02/11/19, right rotator cuff sugery    Patient Stated Goals  to get back to playing golf and doing things I like without pain    Currently in Pain?  Yes    Pain Score  0-No pain    Pain Location  Arm    Pain Orientation  Left    Pain Descriptors / Indicators  Heaviness         [facility] PT Assessment - 03/21/19 0001      Assessment   Medical Diagnosis    nj              Outpatient Rehab from [facility]19/[facility]  Lymphedema Life Impact Scale Total Score  51.47 %           [facility] Adult PT Treatment/Exercise - 03/21/19 0001      Manual Therapy   Manual Therapy  Manual Lymphatic Drainage (MLD);Myofascial release;Passive ROM;Edema management    Edema Management  cut piece of TG soft for pt to wear on LUE, created chip pack for pt to wear in her bra,  issued Rx for pt to take and obtain sleeve and glove    Myofascial Release  gently in left axilla to cording using cross hand technique    Manual Lymphatic Drainage (MLD)  short neck, superficial and deep abdominals, right axillary nodes and establishment of interaxilly pathway, left inguinal nodes and establishment of axillo inguinal pathway, LUE working proximal to distal then retracing all steps while educating pt about basics of MLD    Passive ROM  to left shoulder in direction of flexion, abduction and ER                  PT Long Term Goals - 03/19/19 1026      PT LONG TERM GOAL #1   Title  Pt will be indepdent in self MLD for managmement of LUE swelling.    Time  4    Period  Weeks    Status  New    Target Date  04/16/19      PT LONG TERM GOAL #2   Title  Pt will obtain appropriate compression garments for long  term management of lymphedema.    Time  4    Period  Weeks    Status  New    Target Date  04/16/19      PT LONG TERM GOAL #3   Title  Pt will report 0/10 pain in left arm due to cording to allow her to clean her house without discomfort.    Time  4    Period  Weeks    Status  New    Target Date  04/16/19      PT LONG TERM GOAL #4   Title  Pt will be independent with a home exercise program for continued strengthening and stretching.    Time  4    Period  Weeks    Status  New    Target Date  04/16/19      PT LONG TERM GOAL #5   Title  Pt will demonstate 165 degrees of left shoulder flexion and abduction to allow her to returnto prior level of function.    Baseline  L abd 129, L flex 138    Time  4    Period  Weeks    Status  New    Target Date  04/16/19            Plan - 03/21/19 1028    Clinical Impression Statement  Began MLD to LUE today as pt had more swelling in hand than at eval. Briefly educated pt in basics of MLD and will formally educate her and issue handout at next session. Began gentle PROM to L shoulder where pt felt increased tightness across left pec muscle. Myofascial to cording in left axilla. Pt was going to get an appointment with [facility] to obtain a compression sleeve and glove.     Rehab Potential  Good    PT Frequency  2x / week    PT Duration  4 weeks    PT Treatment/Interventions  ADLs/Self Care Home Management;Therapeutic exercise;Manual techniques;Orthotic Fit/Training;Patient/family education;Manual lymph drainage;Compression bandaging;Scar mobilization;Passive range of motion;Taping    PT Next Visit Plan  see if chip pack for seroma helped, continue PROM to L and myofascial to cording, work on hard area on inferior left breast, teach self MLD and issue handout    PT Home Exercise Plan  supine dowel exercises    Consulted and Agree with Plan of Care  Patient  Patient will benefit from skilled therapeutic intervention in order to  improve the following deficits and impairments:  Increased fascial restricitons, Pain, Postural dysfunction, Impaired UE functional use, Decreased strength, Decreased range of motion, Increased edema  Visit Diagnosis: Lymphedema, not elsewhere classified  Stiffness of left shoulder, not elsewhere classified  Pain in left arm  Disorder of the skin and subcutaneous tissue related to radiation, unspecified     Problem List Patient Active Problem List   Diagnosis Date Noted  . Port-A-Cath in place 08/31/2018  . Malignant neoplasm of upper-inner quadrant of left breast in female, estrogen receptor negative ([place]) 08/28/2018  . Hypertension   . Hyperlipidemia   . Glaucoma   . GERD (gastroesophageal reflux disease)   . HYPERLIPIDEMIA 06/11/2007  . GLAUCOMA NOS 06/11/2007  . HTN (hypertension) 06/11/2007  . ALLERGIC RHINITIS 06/11/2007  . GERD 06/11/2007  . HERPES ZOSTER, UNCOMPLICATED 24/82/5003    Allyson Sabal [facility] 03/21/2019, 10:31 AM  [place], [place], [address] Phone: [phone]   Fax:  [phone]  Name: Andrea Clarke MRN: [id number] Date of Birth: [birth year]  Manus Gunning, PT 03/21/19 10:31 AM

## 2019-03-25 ENCOUNTER — Other Ambulatory Visit: Payer: Self-pay | Admitting: Cardiovascular Disease

## 2019-03-25 ENCOUNTER — Ambulatory Visit: Payer: PRIVATE HEALTH INSURANCE

## 2019-03-25 ENCOUNTER — Other Ambulatory Visit: Payer: Self-pay

## 2019-03-25 DIAGNOSIS — M79602 Pain in left arm: Secondary | ICD-10-CM

## 2019-03-25 DIAGNOSIS — M25612 Stiffness of left shoulder, not elsewhere classified: Secondary | ICD-10-CM | POA: Diagnosis not present

## 2019-03-25 DIAGNOSIS — I890 Lymphedema, not elsewhere classified: Secondary | ICD-10-CM

## 2019-03-25 DIAGNOSIS — L599 Disorder of the skin and subcutaneous tissue related to radiation, unspecified: Secondary | ICD-10-CM

## 2019-03-25 NOTE — Therapy (Signed)
[place], [place], [address] Phone: [phone]   Fax:  [phone]  Physical Therapy Treatment  Patient Details  Name: Andrea Clarke MRN: [id number] Date of Birth: 1946/06/12 Referring Provider (PT): Ave Filter Date: 03/25/2019  PT End of Session - 03/25/19 0901    Visit Number  3    Number of Visits  9    Date for PT Re-Evaluation  04/16/19    PT Start Time  0802    PT Stop Time  0859    PT Time Calculation (min)  57 min    Activity Tolerance  Patient tolerated treatment well    Behavior During Therapy  [facility] for tasks assessed/performed       Past Medical History:  Diagnosis Date  . Atrial tachycardia ([place])   . Breast cancer ([place])    left breast cancer 2019  . 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 Allegra Lai)  . GERD (gastroesophageal reflux disease)   . Glaucoma   . Hyperlipidemia   . Hypertension   . Personal history of chemotherapy   . Personal history of radiation therapy    finished 02/11/19  . PONV (postoperative nausea and vomiting)     Past Surgical History:  Procedure Laterality Date  . ABDOMINAL HYSTERECTOMY    . ANKLE SURGERY    . APPENDECTOMY    . BREAST BIOPSY     Left  . BREAST LUMPECTOMY Left 12/05/2018  . BREAST LUMPECTOMY WITH RADIOACTIVE SEED AND SENTINEL LYMPH NODE BIOPSY N/A 12/05/2018   Procedure: LEFT BREAST LUMPECTOMY WITH RADIOACTIVE SEED AND LEFT AXILLARY SENTINEL LYMPH NODE BIOPSY AND BLUE DYE INJECTION;  Surgeon: Rolm Bookbinder, MD;  Location: [place];  Service: General;  Laterality: N/A;  . CATARACT EXTRACTION W/ INTRAOCULAR LENS IMPLANT Bilateral   . PORTACATH PLACEMENT Right 09/03/2018   Procedure: INSERTION PORT-A-CATH WITH [place];  Surgeon: Rolm Bookbinder, MD;  Location: [place];  Service: General;  Laterality: Right;  . ROTATOR CUFF REPAIR Right   . TONSILLECTOMY      There were no vitals  filed for this visit.  Subjective Assessment - 03/25/19 0808    Subjective  My swelling was good after last visit but of course it didn't last. I got my compression sleeve and gauntlet and they fit well but my hand and fingers have actually been swelling a little more so I'm not crazy about that.     Pertinent History  left breast cancer 12/05/18- left lumpectomy 0/2 nodes negative, 09-03-18- chemotherapy, radiation 01/26/19-02/11/19, right rotator cuff sugery    Patient Stated Goals  to get back to playing golf and doing things I like without pain    Currently in Pain?  No/denies                  Outpatient Rehab [facility]19/2019 [facility]  Lymphedema Life Impact Scale Total Score  51.47 %           [facility] Adult PT Treatment/Exercise - 03/25/19 0001      Manual Therapy   Myofascial Release  gently in left axilla to cording using cross hand technique    Manual Lymphatic Drainage (MLD)  short neck, superficial and deep abdominals, right axillary nodes and establishment of interaxilly pathway, left inguinal nodes and establishment of axillo inguinal pathway, Lt UE working proximal to distal then retracing all steps while reviewing with pt basics about anatomy  of lymphatic system and principles/sequencing of MLD    Passive ROM  to left shoulder in direction of flexion and abduction with scapualr depression throughout to improve comfort of end P/ROM             PT Education - 03/25/19 1047    Education Details  Self manual lymph drainage of Lt UE     Person(s) Educated  Patient    Methods  Explanation;Demonstration;Handout    Comprehension  Verbalized understanding;Returned demonstration;Need further instruction;Tactile cues required          PT Long Term Goals - 03/19/19 1026      PT LONG TERM GOAL #1   Title  Pt will be indepdent in self MLD for managmement of LUE swelling.    Time  4    Period  Weeks    Status  New    [place]  Date  04/16/19      PT LONG TERM GOAL #2   Title  Pt will obtain appropriate compression garments for long term management of lymphedema.    Time  4    Period  Weeks    Status  New    Target Date  04/16/19      PT LONG TERM GOAL #3   Title  Pt will report 0/10 pain in left arm due to cording to allow her to clean her house without discomfort.    Time  4    Period  Weeks    Status  New    Target Date  04/16/19      PT LONG TERM GOAL #4   Title  Pt will be independent with a home exercise program for continued strengthening and stretching.    Time  4    Period  Weeks    Status  New    Target Date  04/16/19      PT LONG TERM GOAL #5   Title  Pt will demonstate 165 degrees of left shoulder flexion and abduction to allow her to returnto prior level of function.    Baseline  L abd 129, L flex 138    Time  4    Period  Weeks    Status  New    Target Date  04/16/19            Plan - 03/25/19 0902    Clinical Impression Statement  Continued with Lt UE MLD today progressing with instructing pt while performing and having her return demo at multiple areas. Hand over hand technique along with VCs for correct pressure, pt was doing well with not sliding on skin though, was able to return good demo of skin stretch. Spoke with her about gettin a glove instead of gauntlet since she is already noticing increased edema in fingers with porlonged wear of gauntlet. 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 But explained to pt that glove would not prevent her from being able to type so she is going to call [facility] to request glove instead of gauntlet tomorrow (they are closed today). Pt reports tenderness at axilla was improved from last week with stretching today as well.     Personal Factors and Comorbidities  Other   hx of chemo and radiation   Examination-Activity Limitations  Lift;Reach Overhead;Carry    Examination-Participation Restrictions   Cleaning    Stability/Clinical Decision Making  Evolving/Moderate complexity    Rehab Potential  Good    PT  Frequency  2x / week    PT Duration  4 weeks    PT Treatment/Interventions  ADLs/Self Care Home Management;Therapeutic exercise;Manual techniques;Orthotic Fit/Training;Patient/family education;Manual lymph drainage;Compression bandaging;Scar mobilization;Passive range of motion;Taping    PT Next Visit Plan  see if chip pack for seroma helped, continue PROM to L and myofascial to cording, work on hard area on inferior left breast, review self MLD    Consulted and Agree with Plan of Care  Patient       Patient will benefit from skilled therapeutic intervention in order to improve the following deficits and impairments:  Increased fascial restricitons, Pain, Postural dysfunction, Impaired UE functional use, Decreased strength, Decreased range of motion, Increased edema  Visit Diagnosis: Lymphedema, not elsewhere classified  Stiffness of left shoulder, not elsewhere classified  Pain in left arm  Disorder of the skin and subcutaneous tissue related to radiation, unspecified     Problem List Patient Active Problem List   Diagnosis Date Noted  . Port-A-Cath in place 08/31/2018  . Malignant neoplasm of upper-inner quadrant of left breast in female, estrogen receptor negative ([place]) 08/28/2018  . Hypertension   . Hyperlipidemia   . Glaucoma   . GERD (gastroesophageal reflux disease)   . HYPERLIPIDEMIA 06/11/2007  . GLAUCOMA NOS 06/11/2007  . HTN (hypertension) 06/11/2007  . ALLERGIC RHINITIS 06/11/2007  . GERD 06/11/2007  . HERPES ZOSTER, UNCOMPLICATED 51/46/0479    Otelia Limes, PTA 03/25/2019, 10:50 AM  [place], [place], [address] Phone: [phone]   Fax:  [phone]  Name: Andrea Clarke MRN: [id number] Date of Birth: 1946-08-02

## 2019-03-25 NOTE — Patient Instructions (Signed)
Start with circles near neck, above collarbones, 10 times.   Cancer Rehab [phone] Deep Effective Breath   Standing, sitting, or laying down, place both hands on the belly. Take a deep breath IN, expanding the belly; then breath OUT, contracting the belly. Repeat __5__ times. Do __2-3__ sessions per day and before your self massage.  Axilla to Axilla - Sweep   On uninvolved side make 5 circles in the armpit, then pump _5__ times from involved armpit across chest to uninvolved armpit, making a pathway. Do _1__ time per day.  Copyright  VHI. All rights reserved.  Axilla to Inguinal Nodes - Sweep   On involved side, make 5 circles at groin at panty line, then pump _5__ times from armpit along side of trunk to outer hip, making your other pathway. Do __1_ time per day.  Copyright  VHI. All rights reserved.  Arm Posterior: Elbow to Shoulder - Sweep   Pump _5__ times from back of elbow to top of shoulder. Then inner to outer upper arm _5_ times, then outer arm again _5_ times. Then back to the pathways _2-3_ times. Do _1__ time per day.  Copyright  VHI. All rights reserved.  ARM: [place] Wrist to Elbow - Sweep   Pump or stationary circles _5__ times from wrist to elbow making sure to do both sides of the forearm. Then retrace your steps to the outer arm, and the pathways _2-3_ times each. Do _1__ time per day.  Copyright  VHI. All rights reserved.  ARM: Dorsum of Hand to Shoulder - Sweep   Pump or stationary circles _5__ times on back of hand including knuckle spaces and individual fingers if needed working up towards the wrist, then retrace all your steps working back up the forearm, doing both sides; upper outer arm and back to your pathways _2-3_ times each. Then do 5 circles again at uninvolved armpit and involved groin where you started! Good job!! Do __1_ time per day

## 2019-03-26 ENCOUNTER — Other Ambulatory Visit: Payer: Self-pay

## 2019-03-26 ENCOUNTER — Inpatient Hospital Stay: Payer: PRIVATE HEALTH INSURANCE

## 2019-03-26 ENCOUNTER — Encounter: Payer: Self-pay | Admitting: Adult Health

## 2019-03-26 ENCOUNTER — Inpatient Hospital Stay (HOSPITAL_BASED_OUTPATIENT_CLINIC_OR_DEPARTMENT_OTHER): Payer: PRIVATE HEALTH INSURANCE | Admitting: Adult Health

## 2019-03-26 ENCOUNTER — Inpatient Hospital Stay: Payer: PRIVATE HEALTH INSURANCE | Attending: Hematology and Oncology

## 2019-03-26 VITALS — BP 140/67 | HR 54 | Temp 98.9°F | Resp 17 | Ht 63.0 in | Wt 166.0 lb

## 2019-03-26 DIAGNOSIS — Z87891 Personal history of nicotine dependence: Secondary | ICD-10-CM | POA: Insufficient documentation

## 2019-03-26 DIAGNOSIS — I10 Essential (primary) hypertension: Secondary | ICD-10-CM | POA: Insufficient documentation

## 2019-03-26 DIAGNOSIS — C50212 Malignant neoplasm of upper-inner quadrant of left female breast: Secondary | ICD-10-CM

## 2019-03-26 DIAGNOSIS — Z171 Estrogen receptor negative status [ER-]: Secondary | ICD-10-CM | POA: Insufficient documentation

## 2019-03-26 DIAGNOSIS — E785 Hyperlipidemia, unspecified: Secondary | ICD-10-CM

## 2019-03-26 DIAGNOSIS — Z95828 Presence of other vascular implants and grafts: Secondary | ICD-10-CM

## 2019-03-26 DIAGNOSIS — Z5112 Encounter for antineoplastic immunotherapy: Secondary | ICD-10-CM | POA: Diagnosis present

## 2019-03-26 LAB — CMP (CANCER CENTER ONLY)
ALT: 109 U/L — ABNORMAL HIGH (ref 0–44)
AST: 75 U/L — ABNORMAL HIGH (ref 15–41)
Albumin: 3.6 g/dL (ref 3.5–5.0)
Alkaline Phosphatase: 84 U/L (ref 38–126)
Anion gap: 9 (ref 5–15)
BUN: 14 mg/dL (ref 8–23)
CO2: 24 mmol/L (ref 22–32)
Calcium: 8.8 mg/dL — ABNORMAL LOW (ref 8.9–10.3)
Chloride: 106 mmol/L (ref 98–111)
Creatinine: 0.72 mg/dL (ref 0.44–1.00)
GFR, Est AFR Am: >60 mL/min (ref >60)
GFR, Estimated: >60 mL/min (ref >60)
Glucose, Bld: 144 mg/dL — ABNORMAL HIGH (ref 70–99)
Potassium: 4.5 mmol/L (ref 3.5–5.1)
Sodium: 139 mmol/L (ref 135–145)
Total Bilirubin: 0.7 mg/dL (ref 0.3–1.2)
Total Protein: 6.3 g/dL — ABNORMAL LOW (ref 6.5–8.1)

## 2019-03-26 LAB — CBC WITH DIFFERENTIAL (CANCER CENTER ONLY)
Abs Immature Granulocytes: 0.02 10*3/uL (ref 0.00–0.07)
Basophils Absolute: 0 10*3/uL (ref 0.0–0.1)
Basophils Relative: 0 %
Eosinophils Absolute: 0.2 10*3/uL (ref 0.0–0.5)
Eosinophils Relative: 4 %
HCT: 40.1 % (ref 36.0–46.0)
Hemoglobin: 13.9 g/dL (ref 12.0–15.0)
Immature Granulocytes: 0 %
Lymphocytes Relative: 23 %
Lymphs Abs: 1 10*3/uL (ref 0.7–4.0)
MCH: 33.4 pg (ref 26.0–34.0)
MCHC: 34.7 g/dL (ref 30.0–36.0)
MCV: 96.4 fL (ref 80.0–100.0)
Monocytes Absolute: 0.5 10*3/uL (ref 0.1–1.0)
Monocytes Relative: 12 %
Neutro Abs: 2.8 10*3/uL (ref 1.7–7.7)
Neutrophils Relative %: 61 %
Platelet Count: 142 10*3/uL — ABNORMAL LOW (ref 150–400)
RBC: 4.16 MIL/uL (ref 3.87–5.11)
RDW: 12.2 % (ref 11.5–15.5)
WBC Count: 4.6 10*3/uL (ref 4.0–10.5)
nRBC: 0 % (ref 0.0–0.2)

## 2019-03-26 MED ORDER — ACETAMINOPHEN 325 MG PO TABS
650.0000 mg | ORAL_TABLET | Freq: Once | ORAL | Status: AC
  Administered 2019-03-26: 650 mg via ORAL

## 2019-03-26 MED ORDER — ACETAMINOPHEN 325 MG PO TABS
ORAL_TABLET | ORAL | Status: AC
  Filled 2019-03-26: qty 2

## 2019-03-26 MED ORDER — DIPHENHYDRAMINE HCL 50 MG/ML IJ SOLN
25.0000 mg | Freq: Once | INTRAMUSCULAR | Status: AC
  Administered 2019-03-26: 11:00:00 25 mg via INTRAVENOUS

## 2019-03-26 MED ORDER — HEPARIN SOD (PORK) LOCK FLUSH 100 UNIT/ML IV SOLN
500.0000 [IU] | Freq: Once | INTRAVENOUS | Status: AC | PRN
  Administered 2019-03-26: 12:00:00 500 [IU]
  Filled 2019-03-26: qty 5

## 2019-03-26 MED ORDER — SODIUM CHLORIDE 0.9% FLUSH
10.0000 mL | INTRAVENOUS | Status: DC | PRN
  Administered 2019-03-26: 10 mL
  Filled 2019-03-26: qty 10

## 2019-03-26 MED ORDER — SODIUM CHLORIDE 0.9% FLUSH
10.0000 mL | Freq: Once | INTRAVENOUS | Status: AC
  Administered 2019-03-26: 10 mL
  Filled 2019-03-26: qty 10

## 2019-03-26 MED ORDER — TRASTUZUMAB CHEMO 150 MG IV SOLR
450.0000 mg | Freq: Once | INTRAVENOUS | Status: AC
  Administered 2019-03-26: 12:00:00 450 mg via INTRAVENOUS
  Filled 2019-03-26: qty 21.43

## 2019-03-26 MED ORDER — SODIUM CHLORIDE 0.9 % IV SOLN
Freq: Once | INTRAVENOUS | Status: AC
  Administered 2019-03-26: 11:00:00 via INTRAVENOUS
  Filled 2019-03-26: qty 250

## 2019-03-26 MED ORDER — DIPHENHYDRAMINE HCL 50 MG/ML IJ SOLN
INTRAMUSCULAR | Status: AC
  Filled 2019-03-26: qty 1

## 2019-03-26 NOTE — Patient Instructions (Signed)
[facility] Discharge Instructions for Patients Receiving Chemotherapy  Today you received the following chemotherapy agents Herceptin  To help prevent nausea and vomiting after your treatment, we encourage you to take your nausea medication as directed by MD   If you develop nausea and vomiting that is not controlled by your nausea medication, call the clinic.   BELOW ARE SYMPTOMS THAT SHOULD BE REPORTED IMMEDIATELY:  *FEVER GREATER THAN 100.5 F  *CHILLS WITH OR WITHOUT FEVER  NAUSEA AND VOMITING THAT IS NOT CONTROLLED WITH YOUR NAUSEA MEDICATION  *UNUSUAL SHORTNESS OF BREATH  *UNUSUAL BRUISING OR BLEEDING  TENDERNESS IN MOUTH AND THROAT WITH OR WITHOUT PRESENCE OF ULCERS  *URINARY PROBLEMS  *BOWEL PROBLEMS  UNUSUAL RASH Items with * indicate a potential emergency and should be followed up as soon as possible.  Feel free to call the clinic should you have any questions or concerns. The clinic phone number is [phone].  Please show the [facility] at check-in to the Emergency Department and triage nurse.

## 2019-03-26 NOTE — Assessment & Plan Note (Addendum)
08/17/2018:Screening mammogram detected abnormality in the left breast by ultrasound irregular mass 10:00 position 1.3 cm, 10 o'clock position no enlarged lymph nodes, ER 0%, PR 0%, Ki-67 80%, HER-2 +3+ by IHC, grade 3, lymphovascular invasion present, T1c N0 stage Ia clinical stage  12/05/2018:Left lumpectomy: No residual invasive carcinoma, margins negative, 0/2 lymph nodes negative, complete pathologic response [id number] Adjuvant radiation therapy: 01/16/2019-02/11/2019  Treatment plan: Herceptin maintenance to complete 1 year of therapy Last echo 03/14/19 with Dr. Haroldine Laws: EF 55-60%  Tanecia is doing well with the Herceptin, and she will continue this for one year of Herceptin therapy.  She will continue PT for her lymphedema.    Return to clinic every 3 weeks for Herceptin every 6 weeks for follow-up with Dr. Lindi Adie with labs.

## 2019-03-26 NOTE — Progress Notes (Signed)
[facility] Follow up:    Andrea Glee., MD [address] [place] [address]   DIAGNOSIS: Cancer Staging Malignant neoplasm of upper-inner quadrant of left breast in female, estrogen receptor negative ([place]) Staging form: Breast, AJCC 8th Edition - Clinical stage from 08/28/2018: Stage IA (cT1c, cN0, cM0, G3, ER-, PR-, HER2+) - Signed by Nicholas Lose, MD on 08/28/2018   SUMMARY OF ONCOLOGIC HISTORY:   Malignant neoplasm of upper-inner quadrant of left breast in female, estrogen receptor negative ([place])   08/17/2018 Initial Diagnosis    Screening mammogram detected abnormality in the left breast by ultrasound irregular mass 10:00 position 1.3 cm, 10 o'clock position no enlarged lymph nodes, ER 0%, PR 0%, Ki-67 80%, HER-2 +3+ by [facility]    08/28/2018 Cancer Staging    Staging form: Breast, AJCC 8th Edition - Clinical stage from 08/28/2018: Stage IA (cT1c, cN0, cM0, G3, ER-, PR-, HER2+) - Signed by Nicholas Lose, MD on 08/28/2018    08/28/2018 Breast MRI    malignancy within the UPPER INNER LEFT breast with dominant mass measuring 2.1 cm. With small adjacent satellite nodules, the entire area 1.5 x 3 x 2.2 cm.No evidence of abnormal lymph nodes or RIGHT breast malignancy.    09/03/2018 -  Chemotherapy    Weekly Taxol and Herceptin followed by maintenance Herceptin for one year.     12/05/2018 Surgery    Left lumpectomy: No residual invasive carcinoma, margins negative, 0/2 lymph nodes negative, complete pathologic response [id number]     CURRENT THERAPY: Herceptin  INTERVAL HISTORY: Andrea Clarke 73 y.o. female returns for evaluation prior to receiving her Herceptin.  She is doing moderately well.  She has unfortunately developed left arm lymphedema, and is seeing PT twice per week.  She notes that she feels the lymphedema may be getting worse.  She notes that she bought a compression sleeve over the weekend and has been wearing it regularly.  She notes she has significant cording and is seeing PT about this also.  She notes she continues to have a seroma and that has been drained 9 times.  Currently draining is on hold due to PT.   Patient Active Problem List   Diagnosis Date Noted  . Port-A-Cath in place 08/31/2018  . Malignant neoplasm of upper-inner quadrant of left breast in female, estrogen receptor negative ([place]) 08/28/2018  . Hypertension   . Hyperlipidemia   . Glaucoma   . GERD (gastroesophageal reflux disease)   . HYPERLIPIDEMIA 06/11/2007  . GLAUCOMA NOS 06/11/2007  . HTN (hypertension) 06/11/2007  . ALLERGIC RHINITIS 06/11/2007  . GERD 06/11/2007  . HERPES ZOSTER, UNCOMPLICATED 34/19/3790    is allergic to adhesive [tape]; oxycodone hcl; augmentin [amoxicillin-pot clavulanate]; and xylocaine [lidocaine hcl].  MEDICAL HISTORY: Past Medical History:  Diagnosis Date  . Atrial tachycardia ([place])   . Breast cancer ([place])    left breast cancer 2019  . 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 Allegra Lai)  . GERD (gastroesophageal reflux disease)   . Glaucoma   . Hyperlipidemia   . Hypertension   . Personal history of chemotherapy   . Personal history of radiation therapy    finished 02/11/19  . PONV (postoperative nausea and vomiting)     SURGICAL HISTORY: Past Surgical History:  Procedure Laterality Date  . ABDOMINAL HYSTERECTOMY    . ANKLE SURGERY    . APPENDECTOMY    . BREAST BIOPSY  Left  . BREAST LUMPECTOMY Left 12/05/2018  . BREAST LUMPECTOMY WITH RADIOACTIVE SEED AND SENTINEL LYMPH NODE BIOPSY N/A 12/05/2018   Procedure: LEFT BREAST LUMPECTOMY WITH RADIOACTIVE SEED AND LEFT AXILLARY SENTINEL LYMPH NODE BIOPSY AND BLUE DYE INJECTION;  Surgeon: Rolm Bookbinder, MD;  Location: [facility];  Service: General;  Laterality: N/A;  . CATARACT EXTRACTION W/ INTRAOCULAR LENS IMPLANT Bilateral   . PORTACATH PLACEMENT Right 09/03/2018   Procedure: INSERTION PORT-A-CATH WITH  [place];  Surgeon: Rolm Bookbinder, MD;  Location: [facility];  Service: General;  Laterality: Right;  . ROTATOR CUFF REPAIR Right   . TONSILLECTOMY      SOCIAL HISTORY: Social History   Socioeconomic History  . Marital status: Married    Spouse name: Not on file  . Number of children: Not on file  . Years of education: Not on file  . Highest education level: Not on file  Occupational History  . Not on file  Social Needs  . Financial resource strain: Not on file  . Food insecurity:    Worry: Not on file    Inability: Not on file  . Transportation needs:    Medical: No    Non-medical: No  Tobacco Use  . Smoking status: Former Research scientist (life sciences)  . Smokeless tobacco: Never Used  Substance and Sexual Activity  . Alcohol use: Yes    Comment: occasional- Have not had since 08/2018  . Drug use: No  . Sexual activity: Not on file  Lifestyle  . Physical activity:    Days per week: Not on file    Minutes per session: Not on file  . Stress: Not on file  Relationships  . Social connections:    Talks on phone: Not on file    Gets together: Not on file    Attends religious service: Not on file    Active member of club or organization: Not on file    Attends meetings of clubs or organizations: Not on file    Relationship status: Not on file  . Intimate partner violence:    Fear of current or ex partner: Not on file    Emotionally abused: Not on file    Physically abused: Not on file    Forced sexual activity: Not on file  Other Topics Concern  . Not on file  Social History Narrative  . Not on file    FAMILY HISTORY: Family History  Problem Relation Age of Onset  . Hypertension Mother   . Breast cancer Neg Hx     Review of Systems  Constitutional: Positive for fatigue. Negative for appetite change, chills, fever and unexpected weight change.  HENT:   Negative for hearing loss, lump/mass, mouth sores, sore throat and trouble swallowing.   Eyes: Negative for eye problems and icterus.   Respiratory: Negative for chest tightness, cough and shortness of breath.   Cardiovascular: Negative for chest pain, leg swelling and palpitations.  Gastrointestinal: Negative for abdominal distention, abdominal pain, constipation, diarrhea, nausea and vomiting.  Endocrine: Negative for hot flashes.  Musculoskeletal: Negative for arthralgias.  Skin: Negative for itching and rash.  Neurological: Negative for dizziness, extremity weakness, headaches and numbness.  Hematological: Negative for adenopathy. Does not bruise/bleed easily.  Psychiatric/Behavioral: Negative for depression. The patient is not nervous/anxious.       PHYSICAL EXAMINATION  ECOG PERFORMANCE STATUS: 1 - Symptomatic but completely ambulatory  Vitals:   03/26/19 1000  BP: 140/67  Pulse: (!) 54  Resp: 17  Temp: 98.9 F (37.2  C)  SpO2: 97%    Physical Exam Constitutional:      General: She is not in acute distress.    Appearance: Normal appearance. She is not toxic-appearing.  HENT:     Head: Normocephalic and atraumatic.     Mouth/Throat:     Mouth: Mucous membranes are moist.     Pharynx: Oropharynx is clear. No oropharyngeal exudate or posterior oropharyngeal erythema.  Eyes:     General: No scleral icterus.    Pupils: Pupils are equal, round, and reactive to light.  Neck:     Musculoskeletal: Normal range of motion and neck supple.  Cardiovascular:     Rate and Rhythm: Normal rate and regular rhythm.     Pulses: Normal pulses.     Heart sounds: Normal heart sounds.  Pulmonary:     Effort: Pulmonary effort is normal.     Breath sounds: Normal breath sounds.  Abdominal:     General: Abdomen is flat. Bowel sounds are normal. There is no distension.     Palpations: Abdomen is soft.     Tenderness: There is no abdominal tenderness.  Musculoskeletal:        General: No swelling.  Lymphadenopathy:     Cervical: No cervical adenopathy.  Skin:    General: Skin is warm and dry.     Capillary Refill:  Capillary refill takes less than 2 seconds.     Findings: No rash.  Neurological:     General: No focal deficit present.     Mental Status: She is alert.  Psychiatric:        Mood and Affect: Mood normal.        Behavior: Behavior normal.     LABORATORY DATA:  CBC    Component Value Date/Time   WBC 4.6 03/26/2019 0847   WBC 5.2 11/30/2018 1113   RBC 4.16 03/26/2019 0847   HGB 13.9 03/26/2019 0847   HCT 40.1 03/26/2019 0847   PLT 142 (L) 03/26/2019 0847   MCV 96.4 03/26/2019 0847   MCH 33.4 03/26/2019 0847   MCHC 34.7 03/26/2019 0847   RDW 12.2 03/26/2019 0847   LYMPHSABS 1.0 03/26/2019 0847   MONOABS 0.5 03/26/2019 0847   EOSABS 0.2 03/26/2019 0847   BASOSABS 0.0 03/26/2019 0847    CMP     Component Value Date/Time   NA 139 03/26/2019 0847   K 4.5 03/26/2019 0847   CL 106 03/26/2019 0847   CO2 24 03/26/2019 0847   GLUCOSE 144 (H) 03/26/2019 0847   BUN 14 03/26/2019 0847   CREATININE 0.72 03/26/2019 0847   CALCIUM 8.8 (L) 03/26/2019 0847   PROT 6.3 (L) 03/26/2019 0847   ALBUMIN 3.6 03/26/2019 0847   AST 75 (H) 03/26/2019 0847   ALT 109 (H) 03/26/2019 0847   ALKPHOS 84 03/26/2019 0847   BILITOT 0.7 03/26/2019 0847   GFRNONAA >60 03/26/2019 0847   GFRAA >60 03/26/2019 0847      ASSESSMENT and THERAPY PLAN:   Malignant neoplasm of upper-inner quadrant of left breast in female, estrogen receptor negative ([place]) 08/17/2018:Screening mammogram detected abnormality in the left breast by ultrasound irregular mass 10:00 position 1.3 cm, 10 o'clock position no enlarged lymph nodes, ER 0%, PR 0%, Ki-67 80%, HER-2 +3+ by IHC, grade 3, lymphovascular invasion present, T1c N0 stage Ia clinical stage  12/05/2018:Left lumpectomy: No residual invasive carcinoma, margins negative, 0/2 lymph nodes negative, complete pathologic response [id number] Adjuvant radiation therapy: 01/16/2019-02/11/2019  Treatment plan: Herceptin maintenance to complete 1  year of therapy Last echo  03/14/19 with Dr. Haroldine Laws: EF 55-60%  Andrea Clarke is doing well with the Herceptin, and she will continue this for one year of Herceptin therapy.  She will continue PT for her lymphedema.    Return to clinic every 3 weeks for Herceptin every 6 weeks for follow-up with Dr. Lindi Adie with labs.     All questions were answered. The patient knows to call the clinic with any problems, questions or concerns. We can certainly see the patient much sooner if necessary.  A total of (20) minutes of face-to-face time was spent with this patient with greater than 50% of that time in counseling and care-coordination.  This note was electronically signed. Scot Dock, NP 03/26/2019

## 2019-03-26 NOTE — Patient Instructions (Signed)

## 2019-03-28 ENCOUNTER — Ambulatory Visit: Payer: PRIVATE HEALTH INSURANCE | Admitting: Physical Therapy

## 2019-03-28 ENCOUNTER — Encounter: Payer: Self-pay | Admitting: Physical Therapy

## 2019-03-28 ENCOUNTER — Other Ambulatory Visit: Payer: Self-pay

## 2019-03-28 DIAGNOSIS — L599 Disorder of the skin and subcutaneous tissue related to radiation, unspecified: Secondary | ICD-10-CM

## 2019-03-28 DIAGNOSIS — M25612 Stiffness of left shoulder, not elsewhere classified: Secondary | ICD-10-CM

## 2019-03-28 DIAGNOSIS — M79602 Pain in left arm: Secondary | ICD-10-CM

## 2019-03-28 DIAGNOSIS — I890 Lymphedema, not elsewhere classified: Secondary | ICD-10-CM

## 2019-03-28 NOTE — Therapy (Signed)
[place], [place], [address] Phone: [phone]   Fax:  [phone]  Physical Therapy Treatment  Patient Details  Name: Andrea Clarke MRN: [id number] Date of Birth: 08/27/1946 Referring Provider (PT): Ave Filter Date: 03/28/2019  PT End of Session - 03/28/19 0925    Visit Number  4    Number of Visits  9    Date for PT Re-Evaluation  04/16/19    PT Start Time  0833    PT Stop Time  0919    PT Time Calculation (min)  46 min    Activity Tolerance  Patient tolerated treatment well    Behavior During Therapy  [facility] for tasks assessed/performed       Past Medical History:  Diagnosis Date  . Atrial tachycardia ([place])   . Breast cancer ([place])    left breast cancer 2019  . 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 Allegra Lai)  . GERD (gastroesophageal reflux disease)   . Glaucoma   . Hyperlipidemia   . Hypertension   . Personal history of chemotherapy   . Personal history of radiation therapy    finished 02/11/19  . PONV (postoperative nausea and vomiting)     Past Surgical History:  Procedure Laterality Date  . ABDOMINAL HYSTERECTOMY    . ANKLE SURGERY    . APPENDECTOMY    . BREAST BIOPSY     Left  . BREAST LUMPECTOMY Left 12/05/2018  . BREAST LUMPECTOMY WITH RADIOACTIVE SEED AND SENTINEL LYMPH NODE BIOPSY N/A 12/05/2018   Procedure: LEFT BREAST LUMPECTOMY WITH RADIOACTIVE SEED AND LEFT AXILLARY SENTINEL LYMPH NODE BIOPSY AND BLUE DYE INJECTION;  Surgeon: Rolm Bookbinder, MD;  Location: [place];  Service: General;  Laterality: N/A;  . CATARACT EXTRACTION W/ INTRAOCULAR LENS IMPLANT Bilateral   . PORTACATH PLACEMENT Right 09/03/2018   Procedure: INSERTION PORT-A-CATH WITH [place];  Surgeon: Rolm Bookbinder, MD;  Location: [place];  Service: General;  Laterality: Right;  . ROTATOR CUFF REPAIR Right   . TONSILLECTOMY      There were no vitals  filed for this visit.  Subjective Assessment - 03/28/19 0836    Subjective  I am having more swelling in my hand and it hurts. It is in my fingers and palm. I am going to call [facility] to get a glove instead of a gauntlet. I have been doing the self massage.    Pertinent History  left breast cancer 12/05/18- left lumpectomy 0/2 nodes negative, 09-03-18- chemotherapy, radiation 01/26/19-02/11/19, right rotator cuff sugery    Patient Stated Goals  to get back to playing golf and doing things I like without pain    Currently in Pain?  Yes    Pain Score  7     Pain Location  Arm    Pain Orientation  Left    Pain Descriptors / Indicators  Sore    Pain Type  Acute pain    Aggravating Factors   raising arm    Pain Relieving Factors  keeping arm still    Effect of Pain on Daily Activities  hard to reach up                  Outpatient Re[facility]19/[facility]  Lymphedema Life Impact Scale Total Score  51.47 %           [facility] Adult PT Treatment/Exercise - 03/28/19 0001  Manual Therapy   Manual Therapy  Manual Lymphatic Drainage (MLD);Myofascial release;Passive ROM;Neural Stretch    Myofascial Release  gently in left axilla to cording using cross hand technique and at antecubital fossa    Manual Lymphatic Drainage (MLD)  short neck, superficial and deep abdominals, right axillary nodes and establishment of interaxilly pathway, left inguinal nodes and establishment of axillo inguinal pathway, Lt UE with focus on hand working proximal to distal then retracing    Passive ROM  to left shoulder in direction of flexion and abduction with scapualr depression throughout to improve comfort of end P/ROM    Neural Stretch  median nerve glide performed in supine                  PT Long Term Goals - 03/19/19 1026      PT LONG TERM GOAL #1   Title  Pt will be indepdent in self MLD for managmement of LUE swelling.    Time  4     Period  Weeks    Status  New    Target Date  04/16/19      PT LONG TERM GOAL #2   Title  Pt will obtain appropriate compression garments for long term management of lymphedema.    Time  4    Period  Weeks    Status  New    Target Date  04/16/19      PT LONG TERM GOAL #3   Title  Pt will report 0/10 pain in left arm due to cording to allow her to clean her house without discomfort.    Time  4    Period  Weeks    Status  New    Target Date  04/16/19      PT LONG TERM GOAL #4   Title  Pt will be independent with a home exercise program for continued strengthening and stretching.    Time  4    Period  Weeks    Status  New    Target Date  04/16/19      PT LONG TERM GOAL #5   Title  Pt will demonstate 165 degrees of left shoulder flexion and abduction to allow her to returnto prior level of function.    Baseline  L abd 129, L flex 138    Time  4    Period  Weeks    Status  New    Target Date  04/16/19            Plan - 03/28/19 6553    Clinical Impression Statement  Pt arrived today with her hand more swollen especially her fingers. She was going to call [facility] and get a glove instead of a gauntlet. Educated pt that while she awaits for arrival of the glove she may want to hold off on wearing her sleeve since her hand swelling has worsened. Pt is still having increased pain from cording down her arm. The cording is palpable at her axilla but was unable to palpate cording past that point today. Performed myofascial release and PROM to help decrease discomfort in those areas.    Rehab Potential  Good    PT Frequency  2x / week    PT Duration  4 weeks    PT Treatment/Interventions  ADLs/Self Care Home Management;Therapeutic exercise;Manual techniques;Orthotic Fit/Training;Patient/family education;Manual lymph drainage;Compression bandaging;Scar mobilization;Passive range of motion;Taping    PT Next Visit Plan  give median nerve stretch, see if glove was  ordered,  continue PROM to L and myofascial to cording, work on hard area on inferior left breast, review self MLD    PT Home Exercise Plan  supine dowel exercises    Consulted and Agree with Plan of Care  Patient       Patient will benefit from skilled therapeutic intervention in order to improve the following deficits and impairments:  Increased fascial restricitons, Pain, Postural dysfunction, Impaired UE functional use, Decreased strength, Decreased range of motion, Increased edema  Visit Diagnosis: Lymphedema, not elsewhere classified  Stiffness of left shoulder, not elsewhere classified  Pain in left arm  Disorder of the skin and subcutaneous tissue related to radiation, unspecified     Problem List Patient Active Problem List   Diagnosis Date Noted  . Port-A-Cath in place 08/31/2018  . Malignant neoplasm of upper-inner quadrant of left breast in female, estrogen receptor negative ([place]) 08/28/2018  . Hypertension   . Hyperlipidemia   . Glaucoma   . GERD (gastroesophageal reflux disease)   . HYPERLIPIDEMIA 06/11/2007  . GLAUCOMA NOS 06/11/2007  . HTN (hypertension) 06/11/2007  . ALLERGIC RHINITIS 06/11/2007  . GERD 06/11/2007  . HERPES ZOSTER, UNCOMPLICATED 80/88/1103    Allyson Sabal [facility] 03/28/2019, 9:32 AM  [place], [place], [address] Phone: [phone]   Fax:  [phone]  Name: Andrea Clarke MRN: [id number] Date of Birth: 1945-11-15  Manus Gunning, PT 03/28/19 9:32 AM

## 2019-04-01 ENCOUNTER — Ambulatory Visit: Payer: PRIVATE HEALTH INSURANCE

## 2019-04-01 ENCOUNTER — Other Ambulatory Visit: Payer: Self-pay

## 2019-04-01 DIAGNOSIS — M79602 Pain in left arm: Secondary | ICD-10-CM

## 2019-04-01 DIAGNOSIS — I890 Lymphedema, not elsewhere classified: Secondary | ICD-10-CM

## 2019-04-01 DIAGNOSIS — M25612 Stiffness of left shoulder, not elsewhere classified: Secondary | ICD-10-CM

## 2019-04-01 DIAGNOSIS — L599 Disorder of the skin and subcutaneous tissue related to radiation, unspecified: Secondary | ICD-10-CM

## 2019-04-01 NOTE — Therapy (Signed)
[place], [place], [address] Phone: [phone]   Fax:  [phone]  Physical Therapy Treatment  Patient Details  Name: Andrea Clarke MRN: [id number] Date of Birth: Jan 05, 1946 Referring Provider (PT): Andrea Clarke Date: 04/01/2019  PT End of Session - 04/01/19 0855    Visit Number  5    Number of Visits  9    Date for PT Re-Evaluation  04/16/19    PT Start Time  0803    PT Stop Time  0850    PT Time Calculation (min)  47 min    Activity Tolerance  Patient tolerated treatment well    Behavior During Therapy  [facility] for tasks assessed/performed       Past Medical History:  Diagnosis Date  . Atrial tachycardia ([place])   . Breast cancer ([place])    left breast cancer 2019  . 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 Allegra Clarke)  . GERD (gastroesophageal reflux disease)   . Glaucoma   . Hyperlipidemia   . Hypertension   . Personal history of chemotherapy   . Personal history of radiation therapy    finished 02/11/19  . PONV (postoperative nausea and vomiting)     Past Surgical History:  Procedure Laterality Date  . ABDOMINAL HYSTERECTOMY    . ANKLE SURGERY    . APPENDECTOMY    . BREAST BIOPSY     Left  . BREAST LUMPECTOMY Left 12/05/2018  . BREAST LUMPECTOMY WITH RADIOACTIVE SEED AND SENTINEL LYMPH NODE BIOPSY N/A 12/05/2018   Procedure: LEFT BREAST LUMPECTOMY WITH RADIOACTIVE SEED AND LEFT AXILLARY SENTINEL LYMPH NODE BIOPSY AND BLUE DYE INJECTION;  Surgeon: Andrea Bookbinder, MD;  Location: [place];  Service: General;  Laterality: N/A;  . CATARACT EXTRACTION W/ INTRAOCULAR LENS IMPLANT Bilateral   . PORTACATH PLACEMENT Right 09/03/2018   Procedure: INSERTION PORT-A-CATH WITH [place];  Surgeon: Andrea Bookbinder, MD;  Location: [place];  Service: General;  Laterality: Right;  . ROTATOR CUFF REPAIR Right   . TONSILLECTOMY      There were no vitals  filed for this visit.  Subjective Assessment - 04/01/19 0805    Subjective  My hand is really bothering me from the swelling. I stopped wearing my sleeve since I don't have any hand coverage as the gauntlet was making my hand worse as Andrea Clarke had suggested. The earliest appt I could get at [facility] was for tomorrow to get a glove. I started taking a B12 tablet per Dr. Landis Gandy PA [place], but not sure about the mg. I'll bring it to my next appt. And I've been trying to do the massage like you guys but I just don't have your touch.    Pertinent History  left breast cancer 12/05/18- left lumpectomy 0/2 nodes negative, 09-03-18- chemotherapy, radiation 01/26/19-02/11/19, right rotator cuff sugery    Patient Stated Goals  to get back to playing golf and doing things I like without pain    Currently in Pain?  No/denies                  Outpatient Re[facility]  Lymphedema Life Impact Scale Total Score  51.47 %           [facility] Adult PT Treatment/Exercise - 04/01/19 0001      Manual Therapy   Manual Therapy  Manual Lymphatic Drainage (MLD);Myofascial release;Passive ROM;Neural Stretch  Myofascial Release  gently in left axilla to cording using cross hand technique, only at axilla today as notightness palpated at antecubital fossa    Manual Lymphatic Drainage (MLD)  short neck, superficial and deep abdominals, right axillary nodes and establishment of interaxilly pathway, left inguinal nodes and establishment of axillo inguinal pathway, Lt UE with focus on hand working proximal to distal then retracing all steps verbally reviewing pressure with pt throughout    Passive ROM  to left shoulder in direction of flexion and abduction to pts end range    Neural Stretch  median nerve glide performed in supine, but this much improved today with pt reporting very min tightness                  PT Long Term Goals - 03/19/19  1026      PT LONG TERM GOAL #1   Title  Pt will be indepdent in self MLD for managmement of LUE swelling.    Time  4    Period  Weeks    Status  New    Target Date  04/16/19      PT LONG TERM GOAL #2   Title  Pt will obtain appropriate compression garments for long term management of lymphedema.    Time  4    Period  Weeks    Status  New    Target Date  04/16/19      PT LONG TERM GOAL #3   Title  Pt will report 0/10 pain in left arm due to cording to allow her to clean her house without discomfort.    Time  4    Period  Weeks    Status  New    Target Date  04/16/19      PT LONG TERM GOAL #4   Title  Pt will be independent with a home exercise program for continued strengthening and stretching.    Time  4    Period  Weeks    Status  New    Target Date  04/16/19      PT LONG TERM GOAL #5   Title  Pt will demonstate 165 degrees of left shoulder flexion and abduction to allow her to returnto prior level of function.    Baseline  L abd 129, L flex 138    Time  4    Period  Weeks    Status  New    Target Date  04/16/19            Plan - 04/01/19 0856    Clinical Impression Statement  Continued with focus on manual therapy today as pt reports her hand feeling more swollen though she did go fora 2 mile walk for first time yesterday. And as she is not able to wear compression with increased activity due to needing a glove, instructed pt it's great to walk, but if able plan to do self MLD as soon after until she gets proper compression for Lt UE. Pt verbalized understanding this. Her end P/ROM was full today and pt reported very little to no tightness. Answered her questions during session regarding how to safely progress into strengthening with "low and slow" in mind. Pt is interested in Systems analyst ABC program.    Personal Factors and Comorbidities  Other   history of chemo and radiation   Examination-Activity Limitations  Lift;Reach Overhead;Carry     Examination-Participation Restrictions  Cleaning    Stability/Clinical Decision Making  Evolving/Moderate complexity  Rehab Potential  Good    PT Frequency  2x / week    PT Duration  4 weeks    PT Treatment/Interventions  ADLs/Self Care Home Management;Therapeutic exercise;Manual techniques;Orthotic Fit/Training;Patient/family education;Manual lymph drainage;Compression bandaging;Scar mobilization;Passive range of motion;Taping    PT Next Visit Plan  See if glove was ordered,remeasure A/ROM;  instruct pt in Strength ABC program but instruct to not add at home until has glove and can wear compression for Lt UE; continue PROM to Lt and myofascial to cording, work on hard area on inferior left breast, review self MLDprn    Consulted and Agree with Plan of Care  Patient       Patient will benefit from skilled therapeutic intervention in order to improve the following deficits and impairments:  Increased fascial restricitons, Pain, Postural dysfunction, Impaired UE functional use, Decreased strength, Decreased range of motion, Increased edema  Visit Diagnosis: Lymphedema, not elsewhere classified  Stiffness of left shoulder, not elsewhere classified  Pain in left arm  Disorder of the skin and subcutaneous tissue related to radiation, unspecified     Problem List Patient Active Problem List   Diagnosis Date Noted  . Port-A-Cath in place 08/31/2018  . Malignant neoplasm of upper-inner quadrant of left breast in female, estrogen receptor negative ([place]) 08/28/2018  . Hypertension   . Hyperlipidemia   . Glaucoma   . GERD (gastroesophageal reflux disease)   . HYPERLIPIDEMIA 06/11/2007  . GLAUCOMA NOS 06/11/2007  . HTN (hypertension) 06/11/2007  . ALLERGIC RHINITIS 06/11/2007  . GERD 06/11/2007  . HERPES ZOSTER, UNCOMPLICATED 12/81/1886    Otelia Limes, PTA 04/01/2019, 9:04 AM  [place], [place], [address] Phone: [phone]   Fax:  [phone]  Name: Andrea Clarke MRN: [id number] Date of Birth: [birth year]

## 2019-04-03 ENCOUNTER — Other Ambulatory Visit: Payer: Self-pay

## 2019-04-03 ENCOUNTER — Ambulatory Visit: Payer: PRIVATE HEALTH INSURANCE

## 2019-04-03 DIAGNOSIS — M25612 Stiffness of left shoulder, not elsewhere classified: Secondary | ICD-10-CM

## 2019-04-03 DIAGNOSIS — M79602 Pain in left arm: Secondary | ICD-10-CM

## 2019-04-03 DIAGNOSIS — L599 Disorder of the skin and subcutaneous tissue related to radiation, unspecified: Secondary | ICD-10-CM

## 2019-04-03 DIAGNOSIS — I890 Lymphedema, not elsewhere classified: Secondary | ICD-10-CM

## 2019-04-03 NOTE — Therapy (Signed)
[place], [place], [address] Phone: [phone]   Fax:  [phone]  Physical Therapy Treatment  Patient Details  Name: Andrea Clarke MRN: [id number] Date of Birth: Aug 12, 1946 Referring Provider (PT): Ave Filter Date: 04/03/2019  PT End of Session - 04/03/19 0856    Visit Number  6    Number of Visits  9    Date for PT Re-Evaluation  04/16/19    PT Start Time  0802    PT Stop Time  0850    PT Time Calculation (min)  48 min    Activity Tolerance  Patient tolerated treatment well    Behavior During Therapy  [facility] for tasks assessed/performed       Past Medical History:  Diagnosis Date  . Atrial tachycardia ([place])   . Breast cancer ([place])    left breast cancer 2019  . 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 Allegra Lai)  . GERD (gastroesophageal reflux disease)   . Glaucoma   . Hyperlipidemia   . Hypertension   . Personal history of chemotherapy   . Personal history of radiation therapy    finished 02/11/19  . PONV (postoperative nausea and vomiting)     Past Surgical History:  Procedure Laterality Date  . ABDOMINAL HYSTERECTOMY    . ANKLE SURGERY    . APPENDECTOMY    . BREAST BIOPSY     Left  . BREAST LUMPECTOMY Left 12/05/2018  . BREAST LUMPECTOMY WITH RADIOACTIVE SEED AND SENTINEL LYMPH NODE BIOPSY N/A 12/05/2018   Procedure: LEFT BREAST LUMPECTOMY WITH RADIOACTIVE SEED AND LEFT AXILLARY SENTINEL LYMPH NODE BIOPSY AND BLUE DYE INJECTION;  Surgeon: Rolm Bookbinder, MD;  Location: [facility];  Service: General;  Laterality: N/A;  . CATARACT EXTRACTION W/ INTRAOCULAR LENS IMPLANT Bilateral   . PORTACATH PLACEMENT Right 09/03/2018   Procedure: INSERTION PORT-A-CATH WITH [place];  Surgeon: Rolm Bookbinder, MD;  Location: [facility];  Service: General;  Laterality: Right;  . ROTATOR CUFF REPAIR Right   . TONSILLECTOMY      There were no vitals  filed for this visit.  Subjective Assessment - 04/03/19 0810    Subjective  I can tell my tightness is much improved in my Lt UE, and end ROM is improving as well. I got measured for my compression glove and was told that would arrive by weeks end.    Pertinent History  left breast cancer 12/05/18- left lumpectomy 0/2 nodes negative, 09-03-18- chemotherapy, radiation 01/26/19-02/11/19, right rotator cuff sugery    Patient Stated Goals  to get back to playing golf and doing things I like without pain    Currently in Pain?  No/denies                  Outpatient Rehab from [facility]/19/[facility]  Lymphedema Life Impact Scale Total Score  51.47 %           [facility] Adult PT Treatment/Exercise - 04/03/19 0001      Exercises   Exercises  Other Exercises    Other Exercises   Instructed pt in entirety of Strength ABC exercise packet. She did excellent with return of therapist demo for all.             PT Education - 04/03/19 0856    Education Details  Strength ABC packet    Person(s) Educated  Patient    Methods  Explanation;Demonstration;Handout  Comprehension  Verbalized understanding;Returned demonstration;Tactile cues required          PT Long Term Goals - 03/19/19 1026      PT LONG TERM GOAL #1   Title  Pt will be indepdent in self MLD for managmement of LUE swelling.    Time  4    Period  Weeks    Status  New    Target Date  04/16/19      PT LONG TERM GOAL #2   Title  Pt will obtain appropriate compression garments for long term management of lymphedema.    Time  4    Period  Weeks    Status  New    Target Date  04/16/19      PT LONG TERM GOAL #3   Title  Pt will report 0/10 pain in left arm due to cording to allow her to clean her house without discomfort.    Time  4    Period  Weeks    Status  New    Target Date  04/16/19      PT LONG TERM GOAL #4   Title  Pt will be independent with a home exercise program  for continued strengthening and stretching.    Time  4    Period  Weeks    Status  New    Target Date  04/16/19      PT LONG TERM GOAL #5   Title  Pt will demonstate 165 degrees of left shoulder flexion and abduction to allow her to returnto prior level of function.    Baseline  L abd 129, L flex 138    Time  4    Period  Weeks    Status  New    Target Date  04/16/19            Plan - 04/03/19 0856    Clinical Impression Statement  Instructed pt in Strength ABC exercise program today which she did excellent with. Some tactile and VC for correct technique after demo but she was able to demonstrate good postural and muscular control returning correct demonstrations. Pt reported feeling this was going to be very beneficial in furthering her recovery and helping her to get back into her regular exercise routine she was doing before her diagnosis.    Personal Factors and Comorbidities  Other   history of chemo and radiation   Examination-Activity Limitations  Lift;Reach Overhead;Carry    Examination-Participation Restrictions  Cleaning    Stability/Clinical Decision Making  Evolving/Moderate complexity    Rehab Potential  Good    PT Frequency  2x / week    PT Duration  4 weeks    PT Treatment/Interventions  ADLs/Self Care Home Management;Therapeutic exercise;Manual techniques;Orthotic Fit/Training;Patient/family education;Manual lymph drainage;Compression bandaging;Scar mobilization;Passive range of motion;Taping    PT Next Visit Plan  Remeasure A/ROM;  review prn Strength ABC program; continue PROM to Lt and myofascial to cording, work on hard area on inferior left breast, review self MLDprn    Consulted and Agree with Plan of Care  Patient       Patient will benefit from skilled therapeutic intervention in order to improve the following deficits and impairments:  Increased fascial restricitons, Pain, Postural dysfunction, Impaired UE functional use, Decreased strength, Decreased  range of motion, Increased edema  Visit Diagnosis: 1. Lymphedema, not elsewhere classified   2. Stiffness of left shoulder, not elsewhere classified   3. Pain in left arm   4. Disorder of  the skin and subcutaneous tissue related to radiation, unspecified        Problem List Patient Active Problem List   Diagnosis Date Noted  . Port-A-Cath in place 08/31/2018  . Malignant neoplasm of upper-inner quadrant of left breast in female, estrogen receptor negative ([place]) 08/28/2018  . Hypertension   . Hyperlipidemia   . Glaucoma   . GERD (gastroesophageal reflux disease)   . HYPERLIPIDEMIA 06/11/2007  . GLAUCOMA NOS 06/11/2007  . HTN (hypertension) 06/11/2007  . ALLERGIC RHINITIS 06/11/2007  . GERD 06/11/2007  . HERPES ZOSTER, UNCOMPLICATED 00/86/7619    Otelia Limes, PTA 04/03/2019, 8:59 AM  [place], [place], [address] Phone: [phone]   Fax:  [phone]  Name: Andrea Clarke MRN: [id number] Date of Birth: 06-19-1946

## 2019-04-08 ENCOUNTER — Ambulatory Visit: Payer: PRIVATE HEALTH INSURANCE

## 2019-04-08 ENCOUNTER — Other Ambulatory Visit: Payer: Self-pay

## 2019-04-08 DIAGNOSIS — M25612 Stiffness of left shoulder, not elsewhere classified: Secondary | ICD-10-CM | POA: Diagnosis not present

## 2019-04-08 DIAGNOSIS — I890 Lymphedema, not elsewhere classified: Secondary | ICD-10-CM

## 2019-04-08 DIAGNOSIS — M79602 Pain in left arm: Secondary | ICD-10-CM

## 2019-04-08 DIAGNOSIS — L599 Disorder of the skin and subcutaneous tissue related to radiation, unspecified: Secondary | ICD-10-CM

## 2019-04-08 NOTE — Therapy (Signed)
[place], [place], [address] Phone: [phone]   Fax:  [phone]  Physical Therapy Treatment  Patient Details  Name: Andrea Clarke MRN: [id number] Date of Birth: December 30, 1945 Referring Provider (PT): Ave Filter Date: 04/08/2019  PT End of Session - 04/08/19 0853    Visit Number  7    Number of Visits  9    Date for PT Re-Evaluation  04/16/19    PT Start Time  0802    PT Stop Time  0845    PT Time Calculation (min)  43 min    Activity Tolerance  Patient tolerated treatment well    Behavior During Therapy  [facility] for tasks assessed/performed       Past Medical History:  Diagnosis Date  . Atrial tachycardia ([place])   . Breast cancer ([place])    left breast cancer 2019  . 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 Allegra Lai)  . GERD (gastroesophageal reflux disease)   . Glaucoma   . Hyperlipidemia   . Hypertension   . Personal history of chemotherapy   . Personal history of radiation therapy    finished 02/11/19  . PONV (postoperative nausea and vomiting)     Past Surgical History:  Procedure Laterality Date  . ABDOMINAL HYSTERECTOMY    . ANKLE SURGERY    . APPENDECTOMY    . BREAST BIOPSY     Left  . BREAST LUMPECTOMY Left 12/05/2018  . BREAST LUMPECTOMY WITH RADIOACTIVE SEED AND SENTINEL LYMPH NODE BIOPSY N/A 12/05/2018   Procedure: LEFT BREAST LUMPECTOMY WITH RADIOACTIVE SEED AND LEFT AXILLARY SENTINEL LYMPH NODE BIOPSY AND BLUE DYE INJECTION;  Surgeon: Rolm Bookbinder, MD;  Location: [place];  Service: General;  Laterality: N/A;  . CATARACT EXTRACTION W/ INTRAOCULAR LENS IMPLANT Bilateral   . PORTACATH PLACEMENT Right 09/03/2018   Procedure: INSERTION PORT-A-CATH WITH [place];  Surgeon: Rolm Bookbinder, MD;  Location: [place];  Service: General;  Laterality: Right;  . ROTATOR CUFF REPAIR Right   . TONSILLECTOMY      There were no vitals  filed for this visit.  Subjective Assessment - 04/08/19 0806    Subjective  I got my glove so I've been wearing my compression all weekend but my hand seems more swollen. Maybe it just hasn't been long enough since I've only had it 2 whole days.    Pertinent History  left breast cancer 12/05/18- left lumpectomy 0/2 nodes negative, 09-03-18- chemotherapy, radiation 01/26/19-02/11/19, right rotator cuff sugery    Patient Stated Goals  to get back to playing golf and doing things I like without pain            LYMPHEDEMA/ONCOLOGY QUESTIONNAIRE - 04/08/19 0807      Left Upper Extremity Lymphedema   15 cm Proximal to Olecranon Process  29.1 cm    Olecranon Process  24.3 cm    15 cm Proximal to Ulnar Styloid Process  23.4 cm    Just Proximal to Ulnar Styloid Process  15.8 cm    Across Hand at [employer]  18.7 cm    At [place] of 2nd Digit  6.2 cm           Outpatient [facility]  Lymphedema Life Impact Scale Total Score  51.47 %           [facility] Adult PT Treatment/Exercise - 04/08/19 0001  Manual Therapy   Manual Therapy  Manual Lymphatic Drainage (MLD);Compression Bandaging    Manual Lymphatic Drainage (MLD)  short neck, superficial and deep abdominals, right axillary nodes and establishment of anterior inter-axilly pathway, left inguinal nodes and establishment of Lt axillo-inguinal pathway, Lt UE with focus on hand working proximal to distal then retracing all steps    Compression Bandaging  Massage cream, thin stockinette, Elastomull to fingers 1-4, Artiflex x1, and 1-6, 1-8, and 1-12 cm short stretch compression bandages from hand to axilla.              PT Education - 04/08/19 [phone]    Education Details  Remedial exercises for Lt UE with compression bandages on; also to remove if having any tingling symptoms that aren't alleviated with remedial exercises, but otherwise try to leave on until next session     Person(s) Educated  Patient    Methods  Explanation;Demonstration    Comprehension  Verbalized understanding          PT Long Term Goals - 03/19/19 1026      PT LONG TERM GOAL #1   Title  Pt will be indepdent in self MLD for managmement of LUE swelling.    Time  4    Period  Weeks    Status  New    Target Date  04/16/19      PT LONG TERM GOAL #2   Title  Pt will obtain appropriate compression garments for long term management of lymphedema.    Time  4    Period  Weeks    Status  New    Target Date  04/16/19      PT LONG TERM GOAL #3   Title  Pt will report 0/10 pain in left arm due to cording to allow her to clean her house without discomfort.    Time  4    Period  Weeks    Status  New    Target Date  04/16/19      PT LONG TERM GOAL #4   Title  Pt will be independent with a home exercise program for continued strengthening and stretching.    Time  4    Period  Weeks    Status  New    Target Date  04/16/19      PT LONG TERM GOAL #5   Title  Pt will demonstate 165 degrees of left shoulder flexion and abduction to allow her to returnto prior level of function.    Baseline  L abd 129, L flex 138    Time  4    Period  Weeks    Status  New    Target Date  04/16/19            Plan - 04/08/19 0853    Clinical Impression Statement  Pt came in reporting she had received her compression glove Friday afternoon so wore that with compression sleeve all weekend (daytime only) and still felt her hand/forearm was slightly more swollen. Remeasured circumference and her wrist and upper arm did have increased circumference from last time measured. So gave pt option of trial of bandaging to try to decrease increased fluid despite use of compression. Pt was very agreeable to try this so applied compression bandages today that she will try to leave on until next visits in 2 days. Pt reports bandages comfortable upon leaving session.    Personal Factors and Comorbidities  Other    hx of chemo and  radiation   Examination-Activity Limitations  Lift;Reach Overhead;Carry    Examination-Participation Restrictions  Cleaning    Stability/Clinical Decision Making  Evolving/Moderate complexity    Rehab Potential  Good    PT Frequency  2x / week    PT Duration  4 weeks    PT Treatment/Interventions  ADLs/Self Care Home Management;Therapeutic exercise;Manual techniques;Orthotic Fit/Training;Patient/family education;Manual lymph drainage;Compression bandaging;Scar mobilization;Passive range of motion;Taping    PT Next Visit Plan  See how trial of compression bandaging was tolerated and remeasure circumference to assess effectiveness; if beneficial and pt agreeable cont this and MLD to Lt UE; P/ROM and myofascial release to cording of Lt UE as well    Consulted and Agree with Plan of Care  Patient       Patient will benefit from skilled therapeutic intervention in order to improve the following deficits and impairments:  Increased fascial restricitons, Pain, Postural dysfunction, Impaired UE functional use, Decreased strength, Decreased range of motion, Increased edema  Visit Diagnosis: 1. Lymphedema, not elsewhere classified   2. Stiffness of left shoulder, not elsewhere classified   3. Pain in left arm   4. Disorder of the skin and subcutaneous tissue related to radiation, unspecified        Problem List Patient Active Problem List   Diagnosis Date Noted  . Port-A-Cath in place 08/31/2018  . Malignant neoplasm of upper-inner quadrant of left breast in female, estrogen receptor negative ([place]) 08/28/2018  . Hypertension   . Hyperlipidemia   . Glaucoma   . GERD (gastroesophageal reflux disease)   . HYPERLIPIDEMIA 06/11/2007  . GLAUCOMA NOS 06/11/2007  . HTN (hypertension) 06/11/2007  . ALLERGIC RHINITIS 06/11/2007  . GERD 06/11/2007  . HERPES ZOSTER, UNCOMPLICATED 68/12/2120    Otelia Limes, PTA 04/08/2019, 8:58 AM  [place], [place], [address] Phone: [phone]   Fax:  [phone]  Name: Andrea Clarke MRN: [id number] Date of Birth: Jun 16, 1946

## 2019-04-10 ENCOUNTER — Other Ambulatory Visit: Payer: Self-pay

## 2019-04-10 ENCOUNTER — Ambulatory Visit: Payer: PRIVATE HEALTH INSURANCE

## 2019-04-10 DIAGNOSIS — M25612 Stiffness of left shoulder, not elsewhere classified: Secondary | ICD-10-CM

## 2019-04-10 DIAGNOSIS — M79602 Pain in left arm: Secondary | ICD-10-CM

## 2019-04-10 DIAGNOSIS — I890 Lymphedema, not elsewhere classified: Secondary | ICD-10-CM

## 2019-04-10 DIAGNOSIS — L599 Disorder of the skin and subcutaneous tissue related to radiation, unspecified: Secondary | ICD-10-CM

## 2019-04-10 NOTE — Therapy (Signed)
[place], [place], [address] Phone: [phone]   Fax:  [phone]  Physical Therapy Treatment  Patient Details  Name: Neal Oshea MRN: [id number] Date of Birth: [birth year] Referring Provider (PT): Ave Filter Date: 04/10/2019  PT End of Session - 04/10/19 0848    Visit Number  8    Number of Visits  9    Date for PT Re-Evaluation  04/16/19    PT Start Time  0802    PT Stop Time  0846    PT Time Calculation (min)  44 min    Activity Tolerance  Patient tolerated treatment well    Behavior During Therapy  [facility] for tasks assessed/performed       Past Medical History:  Diagnosis Date  . Atrial tachycardia ([place])   . Breast cancer ([place])    left breast cancer 2019  . 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 Allegra Lai)  . GERD (gastroesophageal reflux disease)   . Glaucoma   . Hyperlipidemia   . Hypertension   . Personal history of chemotherapy   . Personal history of radiation therapy    finished 02/11/19  . PONV (postoperative nausea and vomiting)     Past Surgical History:  Procedure Laterality Date  . ABDOMINAL HYSTERECTOMY    . ANKLE SURGERY    . APPENDECTOMY    . BREAST BIOPSY     Left  . BREAST LUMPECTOMY Left 12/05/2018  . BREAST LUMPECTOMY WITH RADIOACTIVE SEED AND SENTINEL LYMPH NODE BIOPSY N/A 12/05/2018   Procedure: LEFT BREAST LUMPECTOMY WITH RADIOACTIVE SEED AND LEFT AXILLARY SENTINEL LYMPH NODE BIOPSY AND BLUE DYE INJECTION;  Surgeon: Rolm Bookbinder, MD;  Location: [place];  Service: General;  Laterality: N/A;  . CATARACT EXTRACTION W/ INTRAOCULAR LENS IMPLANT Bilateral   . PORTACATH PLACEMENT Right 09/03/2018   Procedure: INSERTION PORT-A-CATH WITH [place];  Surgeon: Rolm Bookbinder, MD;  Location: [place];  Service: General;  Laterality: Right;  . ROTATOR CUFF REPAIR Right   . TONSILLECTOMY      There were no vitals  filed for this visit.  Subjective Assessment - 04/10/19 0804    Subjective  I kept the bandage on but I couldn't do much of anything.    Pertinent History  left breast cancer 12/05/18- left lumpectomy 0/2 nodes negative, 09-03-18- chemotherapy, radiation 01/26/19-02/11/19, right rotator cuff sugery    Patient Stated Goals  to get back to playing golf and doing things I like without pain            LYMPHEDEMA/ONCOLOGY QUESTIONNAIRE - 04/10/19 0805      Left Upper Extremity Lymphedema   15 cm Proximal to Olecranon Process  28.6 cm    Olecranon Process  23.8 cm    15 cm Proximal to Ulnar Styloid Process  23.4 cm    Just Proximal to Ulnar Styloid Process  15.3 cm    Across Hand at [employer]  18.2 cm    At [place] of 2nd Digit  6.2 cm           Outpatient Re[facility] from [facility]19/[facility]  Lymphedema Life Impact Scale Total Score  51.47 %           [facility] Adult PT Treatment/Exercise - 04/10/19 0001      Manual Therapy   Manual Lymphatic Drainage (MLD)  short neck, superficial and deep abdominals, right axillary nodes and  establishment of anterior inter-axilly pathway, left inguinal nodes and establishment of Lt axillo-inguinal pathway, Lt UE with focus on hand working proximal to distal then retracing all steps    Compression Bandaging  Massage cream, thick stockinette, Elastomull to fingers 1-4, Artiflex x1, and 1-6, 1-8, and 1-12 cm short stretch compression bandages from hand to axilla.                   PT Long Term Goals - 03/19/19 1026      PT LONG TERM GOAL #1   Title  Pt will be indepdent in self MLD for managmement of LUE swelling.    Time  4    Period  Weeks    Status  New    Target Date  04/16/19      PT LONG TERM GOAL #2   Title  Pt will obtain appropriate compression garments for long term management of lymphedema.    Time  4    Period  Weeks    Status  New    Target Date  04/16/19      PT LONG TERM  GOAL #3   Title  Pt will report 0/10 pain in left arm due to cording to allow her to clean her house without discomfort.    Time  4    Period  Weeks    Status  New    Target Date  04/16/19      PT LONG TERM GOAL #4   Title  Pt will be independent with a home exercise program for continued strengthening and stretching.    Time  4    Period  Weeks    Status  New    Target Date  04/16/19      PT LONG TERM GOAL #5   Title  Pt will demonstate 165 degrees of left shoulder flexion and abduction to allow her to returnto prior level of function.    Baseline  L abd 129, L flex 138    Time  4    Period  Weeks    Status  New    Target Date  04/16/19            Plan - 04/10/19 0848    Clinical Impression Statement  Pt tolerated bandaging very well and came in with them still on. her circumference measurements were improved and visible reductions as well, especially at hand and forearm. Continued with complete decongestive therapy for at least one more trial. Pt has done Strength ABC Packet 2x since instructed in this last week and reports no problems or questions with this. Pt is to wear the bandages for at least one more day for further reductions, then wear her new compression garments daily through weekend and perform self MLD daily to see if her lymphedema is manageable or will she need to exchange new garments for better containing flat knits?    Personal Factors and Comorbidities  Other   hx of chemo and radiation   Examination-Activity Limitations  Lift;Reach Overhead;Carry    Examination-Participation Restrictions  Cleaning    Stability/Clinical Decision Making  Evolving/Moderate complexity    Rehab Potential  Good    PT Frequency  2x / week    PT Duration  4 weeks    PT Treatment/Interventions  ADLs/Self Care Home Management;Therapeutic exercise;Manual techniques;Orthotic Fit/Training;Patient/family education;Manual lymph drainage;Compression bandaging;Scar mobilization;Passive  range of motion;Taping    PT Next Visit Plan  See how wearing compression garments over weekend and performing  self MLD went and remeasure circumference/ will she need new flat knit garments?    Consulted and Agree with Plan of Care  Patient       Patient will benefit from skilled therapeutic intervention in order to improve the following deficits and impairments:  Increased fascial restricitons, Pain, Postural dysfunction, Impaired UE functional use, Decreased strength, Decreased range of motion, Increased edema  Visit Diagnosis: 1. Lymphedema, not elsewhere classified   2. Stiffness of left shoulder, not elsewhere classified   3. Pain in left arm   4. Disorder of the skin and subcutaneous tissue related to radiation, unspecified        Problem List Patient Active Problem List   Diagnosis Date Noted  . Port-A-Cath in place 08/31/2018  . Malignant neoplasm of upper-inner quadrant of left breast in female, estrogen receptor negative ([place]) 08/28/2018  . Hypertension   . Hyperlipidemia   . Glaucoma   . GERD (gastroesophageal reflux disease)   . HYPERLIPIDEMIA 06/11/2007  . GLAUCOMA NOS 06/11/2007  . HTN (hypertension) 06/11/2007  . ALLERGIC RHINITIS 06/11/2007  . GERD 06/11/2007  . HERPES ZOSTER, UNCOMPLICATED 87/19/5974    Otelia Limes, PTA 04/10/2019, 8:54 AM  [place], [place], [address] Phone: [phone]   Fax:  [phone]  Name: Julieta Rogalski MRN: [id number] Date of Birth: 1946-02-26

## 2019-04-11 ENCOUNTER — Other Ambulatory Visit: Payer: Self-pay | Admitting: Cardiology

## 2019-04-11 ENCOUNTER — Other Ambulatory Visit: Payer: Self-pay | Admitting: Cardiovascular Disease

## 2019-04-15 ENCOUNTER — Ambulatory Visit: Payer: PRIVATE HEALTH INSURANCE

## 2019-04-15 ENCOUNTER — Other Ambulatory Visit: Payer: Self-pay

## 2019-04-15 DIAGNOSIS — L599 Disorder of the skin and subcutaneous tissue related to radiation, unspecified: Secondary | ICD-10-CM

## 2019-04-15 DIAGNOSIS — I890 Lymphedema, not elsewhere classified: Secondary | ICD-10-CM

## 2019-04-15 DIAGNOSIS — M25612 Stiffness of left shoulder, not elsewhere classified: Secondary | ICD-10-CM

## 2019-04-15 DIAGNOSIS — M79602 Pain in left arm: Secondary | ICD-10-CM

## 2019-04-15 NOTE — Therapy (Signed)
[place], [place], [address] Phone: [phone]   Fax:  [phone]  Physical Therapy Treatment  Patient Details  Name: Andrea Clarke MRN: [id number] Date of Birth: 09/19/1946 Referring Provider (PT): Ave Filter Date: 04/15/2019  PT End of Session - 04/15/19 0856    Visit Number  9    Number of Visits  13    Date for PT Re-Evaluation  05/13/19    PT Start Time  0801    PT Stop Time  6579    PT Time Calculation (min)  54 min    Activity Tolerance  Patient tolerated treatment well    Behavior During Therapy  [facility] for tasks assessed/performed       Past Medical History:  Diagnosis Date  . Atrial tachycardia ([place])   . Breast cancer ([place])    left breast cancer 2019  . 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 Allegra Lai)  . GERD (gastroesophageal reflux disease)   . Glaucoma   . Hyperlipidemia   . Hypertension   . Personal history of chemotherapy   . Personal history of radiation therapy    finished 02/11/19  . PONV (postoperative nausea and vomiting)     Past Surgical History:  Procedure Laterality Date  . ABDOMINAL HYSTERECTOMY    . ANKLE SURGERY    . APPENDECTOMY    . BREAST BIOPSY     Left  . BREAST LUMPECTOMY Left 12/05/2018  . BREAST LUMPECTOMY WITH RADIOACTIVE SEED AND SENTINEL LYMPH NODE BIOPSY N/A 12/05/2018   Procedure: LEFT BREAST LUMPECTOMY WITH RADIOACTIVE SEED AND LEFT AXILLARY SENTINEL LYMPH NODE BIOPSY AND BLUE DYE INJECTION;  Surgeon: Rolm Bookbinder, MD;  Location: [place];  Service: General;  Laterality: N/A;  . CATARACT EXTRACTION W/ INTRAOCULAR LENS IMPLANT Bilateral   . PORTACATH PLACEMENT Right 09/03/2018   Procedure: INSERTION PORT-A-CATH WITH [place];  Surgeon: Rolm Bookbinder, MD;  Location: [place];  Service: General;  Laterality: Right;  . ROTATOR CUFF REPAIR Right   . TONSILLECTOMY      There were no vitals  filed for this visit.  Subjective Assessment - 04/15/19 0805    Subjective  I took the bandages off Friday morning and I've been wearing the compression garments ever since and I feel like my swelling is doing much better now. I tried call [facility] but they couldn't tell me if my garment was flat or circular knit. My arm soreness is much improved as the swelling is down now as well, especially at my hand and forearm. I did the self MLD yesterday.    Pertinent History  left breast cancer 12/05/18- left lumpectomy 0/2 nodes negative, 09-03-18- chemotherapy, radiation 01/26/19-02/11/19, right rotator cuff sugery    Patient Stated Goals  to get back to playing golf and doing things I like without pain    Currently in Pain?  No/denies            LYMPHEDEMA/ONCOLOGY QUESTIONNAIRE - 04/15/19 0807      Left Upper Extremity Lymphedema   15 cm Proximal to Olecranon Process  28.7 cm    Olecranon Process  24 cm    15 cm Proximal to Ulnar Styloid Process  23.5 cm    Just Proximal to Ulnar Styloid Process  15.7 cm    Across Hand at [employer]  18.6 cm    At [place] of 2nd Digit  6.2 cm  Outpatient Re[facility] from [facility]  Lymphedema Life Impact Scale Total Score  51.47 %           [facility] Adult PT Treatment/Exercise - 04/15/19 0001      Manual Therapy   Manual Lymphatic Drainage (MLD)  short neck, superficial and deep abdominals, right axillary nodes and establishment of anterior inter-axilly pathway, left inguinal nodes and establishment of Lt axillo-inguinal pathway, Lt UE with focus on hand working proximal to distal then retracing all steps    Compression Bandaging  --                  PT Long Term Goals - 04/15/19 0815      PT LONG TERM GOAL #1   Title  Pt will be indepdent in self MLD for managmement of LUE swelling.    Baseline  Pt is doing this 1-2x/daily-04/11/29    Status  Achieved      PT LONG TERM  GOAL #2   Title  Pt will obtain appropriate compression garments for long term management of lymphedema.    Baseline  Pt has and is wearing these daily now-04/15/19    Status  Achieved      PT LONG TERM GOAL #3   Title  Pt will report 0/10 pain in left arm due to cording to allow her to clean her house without discomfort.    Baseline  2/10 at the most now, was up to 8-9/10 - 04/15/19    Status  Partially Met      PT LONG TERM GOAL #4   Title  Pt will be independent with a home exercise program for continued strengthening and stretching.    Status  On-going      PT LONG TERM GOAL #5   Title  Pt will demonstate 165 degrees of left shoulder flexion and abduction to allow her to returnto prior level of function.    Baseline  L abd 129, L flex 138; flex 167 and abd 173 degrees - 04/15/19    Status  Achieved            Plan - 04/15/19 0858    Clinical Impression Statement  Pt comes in after having doffed bandages on Friday as we discussed and she wore her compression garments rest of the weekend. She reports today noticing some slight swelling still at dorsal hand and this was slightly elevated from last time measured (0.4 cm) but still looks more improved than weeks ago. Pt also notices soreness in hand and forearm much improved as swelling improves (was 8-9/10 pain, now reduced to 2/10 during day). Educated pt in self bandaging of hand today with having her return demonstration (and verbally instructed for arm) so she will be able to try this prn when/if she notices increased hand swelling for nighttime use mostly, also issued handout. She has met her ROM goals and some of HEP (has been instructed in Strength ABC program, but would also benefit from learning supine scapular series). Pt is up for a renewal and would benefit from continued therapy but decreasing to 1x/wk to finalize independence with self compression bandaging and to also finalize HEP. Pt is agreeable to this.    Personal Factors  and Comorbidities  Other   hx of chemo and radiation   Examination-Activity Limitations  Lift;Reach Overhead;Carry    Examination-Participation Restrictions  Cleaning    Stability/Clinical Decision Making  Evolving/Moderate complexity    Rehab Potential  Good  PT Frequency  1x / week    PT Duration  4 weeks    PT Treatment/Interventions  ADLs/Self Care Home Management;Therapeutic exercise;Manual techniques;Orthotic Fit/Training;Patient/family education;Manual lymph drainage;Compression bandaging;Scar mobilization;Passive range of motion;Taping    PT Next Visit Plan  Renewal done this visit. Assess and review self compression bandaging techniques; instruct in supine scapular series finalizing independence with management of lymphedema and HEP for strengthening.    PT Home Exercise Plan  supine dowel exercises; Strength ABC program    Consulted and Agree with Plan of Care  Patient       Patient will benefit from skilled therapeutic intervention in order to improve the following deficits and impairments:  Increased fascial restricitons, Pain, Postural dysfunction, Impaired UE functional use, Decreased strength, Decreased range of motion, Increased edema  Visit Diagnosis: 1. Lymphedema, not elsewhere classified   2. Stiffness of left shoulder, not elsewhere classified   3. Pain in left arm   4. Disorder of the skin and subcutaneous tissue related to radiation, unspecified        Problem List Patient Active Problem List   Diagnosis Date Noted  . Port-A-Cath in place 08/31/2018  . Malignant neoplasm of upper-inner quadrant of left breast in female, estrogen receptor negative ([place]) 08/28/2018  . Hypertension   . Hyperlipidemia   . Glaucoma   . GERD (gastroesophageal reflux disease)   . HYPERLIPIDEMIA 06/11/2007  . GLAUCOMA NOS 06/11/2007  . HTN (hypertension) 06/11/2007  . ALLERGIC RHINITIS 06/11/2007  . GERD 06/11/2007  . HERPES ZOSTER, UNCOMPLICATED 18/33/5825    Otelia Limes, PTA 04/15/2019, 9:11 AM  [place], [place], [address] Phone: [phone]   Fax:  [phone]  Name: Andrea Clarke MRN: [id number] Date of Birth: [birth year]

## 2019-04-16 ENCOUNTER — Inpatient Hospital Stay: Payer: PRIVATE HEALTH INSURANCE

## 2019-04-16 ENCOUNTER — Other Ambulatory Visit: Payer: Self-pay

## 2019-04-16 VITALS — BP 148/77 | HR 51 | Temp 98.0°F | Resp 20 | Wt 165.2 lb

## 2019-04-16 DIAGNOSIS — Z95828 Presence of other vascular implants and grafts: Secondary | ICD-10-CM

## 2019-04-16 DIAGNOSIS — Z171 Estrogen receptor negative status [ER-]: Secondary | ICD-10-CM

## 2019-04-16 DIAGNOSIS — C50212 Malignant neoplasm of upper-inner quadrant of left female breast: Secondary | ICD-10-CM

## 2019-04-16 DIAGNOSIS — Z5112 Encounter for antineoplastic immunotherapy: Secondary | ICD-10-CM | POA: Diagnosis not present

## 2019-04-16 LAB — CBC WITH DIFFERENTIAL (CANCER CENTER ONLY)
Abs Immature Granulocytes: 0.01 10*3/uL (ref 0.00–0.07)
Basophils Absolute: 0 10*3/uL (ref 0.0–0.1)
Basophils Relative: 0 %
Eosinophils Absolute: 0.3 10*3/uL (ref 0.0–0.5)
Eosinophils Relative: 6 %
HCT: 40.5 % (ref 36.0–46.0)
Hemoglobin: 14.1 g/dL (ref 12.0–15.0)
Immature Granulocytes: 0 %
Lymphocytes Relative: 24 %
Lymphs Abs: 1.1 10*3/uL (ref 0.7–4.0)
MCH: 33.7 pg (ref 26.0–34.0)
MCHC: 34.8 g/dL (ref 30.0–36.0)
MCV: 96.9 fL (ref 80.0–100.0)
Monocytes Absolute: 0.5 10*3/uL (ref 0.1–1.0)
Monocytes Relative: 12 %
Neutro Abs: 2.6 10*3/uL (ref 1.7–7.7)
Neutrophils Relative %: 58 %
Platelet Count: 160 10*3/uL (ref 150–400)
RBC: 4.18 MIL/uL (ref 3.87–5.11)
RDW: 12.5 % (ref 11.5–15.5)
WBC Count: 4.5 10*3/uL (ref 4.0–10.5)
nRBC: 0 % (ref 0.0–0.2)

## 2019-04-16 LAB — CMP (CANCER CENTER ONLY)
ALT: 121 U/L — ABNORMAL HIGH (ref 0–44)
AST: 87 U/L — ABNORMAL HIGH (ref 15–41)
Albumin: 3.6 g/dL (ref 3.5–5.0)
Alkaline Phosphatase: 96 U/L (ref 38–126)
Anion gap: 9 (ref 5–15)
BUN: 11 mg/dL (ref 8–23)
CO2: 26 mmol/L (ref 22–32)
Calcium: 8.9 mg/dL (ref 8.9–10.3)
Chloride: 104 mmol/L (ref 98–111)
Creatinine: 0.73 mg/dL (ref 0.44–1.00)
GFR, Est AFR Am: >60 mL/min (ref >60)
GFR, Estimated: >60 mL/min (ref >60)
Glucose, Bld: 142 mg/dL — ABNORMAL HIGH (ref 70–99)
Potassium: 4.3 mmol/L (ref 3.5–5.1)
Sodium: 139 mmol/L (ref 135–145)
Total Bilirubin: 0.7 mg/dL (ref 0.3–1.2)
Total Protein: 6.4 g/dL — ABNORMAL LOW (ref 6.5–8.1)

## 2019-04-16 MED ORDER — ACETAMINOPHEN 325 MG PO TABS
650.0000 mg | ORAL_TABLET | Freq: Once | ORAL | Status: AC
  Administered 2019-04-16: 650 mg via ORAL

## 2019-04-16 MED ORDER — ACETAMINOPHEN 325 MG PO TABS
ORAL_TABLET | ORAL | Status: AC
  Filled 2019-04-16: qty 2

## 2019-04-16 MED ORDER — DIPHENHYDRAMINE HCL 50 MG/ML IJ SOLN
INTRAMUSCULAR | Status: AC
  Filled 2019-04-16: qty 1

## 2019-04-16 MED ORDER — DIPHENHYDRAMINE HCL 50 MG/ML IJ SOLN
25.0000 mg | Freq: Once | INTRAMUSCULAR | Status: AC
  Administered 2019-04-16: 25 mg via INTRAVENOUS

## 2019-04-16 MED ORDER — TRASTUZUMAB CHEMO 150 MG IV SOLR
450.0000 mg | Freq: Once | INTRAVENOUS | Status: AC
  Administered 2019-04-16: 450 mg via INTRAVENOUS
  Filled 2019-04-16: qty 21.43

## 2019-04-16 MED ORDER — SODIUM CHLORIDE 0.9% FLUSH
10.0000 mL | Freq: Once | INTRAVENOUS | Status: AC
  Administered 2019-04-16: 10 mL
  Filled 2019-04-16: qty 10

## 2019-04-16 MED ORDER — SODIUM CHLORIDE 0.9 % IV SOLN
Freq: Once | INTRAVENOUS | Status: AC
  Administered 2019-04-16: 09:00:00 via INTRAVENOUS
  Filled 2019-04-16: qty 250

## 2019-04-16 MED ORDER — HEPARIN SOD (PORK) LOCK FLUSH 100 UNIT/ML IV SOLN
500.0000 [IU] | Freq: Once | INTRAVENOUS | Status: AC | PRN
  Administered 2019-04-16: 500 [IU]
  Filled 2019-04-16: qty 5

## 2019-04-16 MED ORDER — SODIUM CHLORIDE 0.9% FLUSH
10.0000 mL | INTRAVENOUS | Status: DC | PRN
  Administered 2019-04-16: 10 mL
  Filled 2019-04-16: qty 10

## 2019-04-16 NOTE — Patient Instructions (Signed)
[facility] Discharge Instructions for Patients Receiving Chemotherapy  Today you received the following Immunotherapy:  Herceptin  To help prevent nausea and vomiting after your treatment, we encourage you to take your nausea medication as directed by your MD.   If you develop nausea and vomiting that is not controlled by your nausea medication, call the clinic.   BELOW ARE SYMPTOMS THAT SHOULD BE REPORTED IMMEDIATELY:  *FEVER GREATER THAN 100.5 F  *CHILLS WITH OR WITHOUT FEVER  NAUSEA AND VOMITING THAT IS NOT CONTROLLED WITH YOUR NAUSEA MEDICATION  *UNUSUAL SHORTNESS OF BREATH  *UNUSUAL BRUISING OR BLEEDING  TENDERNESS IN MOUTH AND THROAT WITH OR WITHOUT PRESENCE OF ULCERS  *URINARY PROBLEMS  *BOWEL PROBLEMS  UNUSUAL RASH Items with * indicate a potential emergency and should be followed up as soon as possible.  Feel free to call the clinic should you have any questions or concerns. The clinic phone number is [phone].  Please show the CHEMO ALERT CARD at check-in to the Emergency Department and triage nurse.  Coronavirus (COVID-19) Are you at risk?  Are you at risk for the Coronavirus (COVID-19)?  To be considered HIGH RISK for Coronavirus (COVID-19), you have to meet the following criteria:  . Traveled to [place], [place], [place], [place] or [place]; or in the [place] to [place], [place], [place], or [place]; and have fever, cough, and shortness of breath within the last 2 weeks of travel OR . Been in close contact with a person diagnosed with COVID-19 within the last 2 weeks and have fever, cough, and shortness of breath . IF YOU DO NOT MEET THESE CRITERIA, YOU ARE CONSIDERED LOW RISK FOR COVID-19.  What to do if you are HIGH RISK for COVID-19?  . If you are having a medical emergency, call 911. . Seek medical care right away. Before you go to a doctor's office, urgent care or emergency department, call ahead and tell them about  your recent travel, contact with someone diagnosed with COVID-19, and your symptoms. You should receive instructions from your physician's office regarding next steps of care.  . When you arrive at healthcare provider, tell the healthcare staff immediately you have returned from visiting [place], [place], [place], [place] or [place]; or traveled in the [place] to [place], [place], [place], or [place]; in the last two weeks or you have been in close contact with a person diagnosed with COVID-19 in the last 2 weeks.   . Tell the health care staff about your symptoms: fever, cough and shortness of breath. . After you have been seen by a medical provider, you will be either: o Tested for (COVID-19) and discharged home on quarantine except to seek medical care if symptoms worsen, and asked to  - Stay home and avoid contact with others until you get your results (4-5 days)  - Avoid travel on public transportation if possible (such as bus, train, or airplane) or o Sent to the Emergency Department by EMS for evaluation, COVID-19 testing, and possible admission depending on your condition and test results.  What to do if you are LOW RISK for COVID-19?  Reduce your risk of any infection by using the same precautions used for avoiding the common cold or flu:  . Wash your hands often with soap and warm water for at least 20 seconds.  If soap and water are not readily available, use an alcohol-based hand sanitizer with at least 60% alcohol.  . If   coughing or sneezing, cover your mouth and nose by coughing or sneezing into the elbow areas of your shirt or coat, into a tissue or into your sleeve (not your hands). . Avoid shaking hands with others and consider head nods or verbal greetings only. . Avoid touching your eyes, nose, or mouth with unwashed hands.  . Avoid close contact with people who are sick. . Avoid places or events with large numbers of people in one location, like concerts or sporting  events. . Carefully consider travel plans you have or are making. . If you are planning any travel outside or inside the [place], visit the CDC's Travelers' Health webpage for the latest health notices. . If you have some symptoms but not all symptoms, continue to monitor at home and seek medical attention if your symptoms worsen. . If you are having a medical emergency, call 911.   ADDITIONAL HEALTHCARE OPTIONS FOR PATIENTS  [facility] / e-Visit: [email or web address]         [facility]: [phone]  [facility]: [phone]                   [facility]: [phone]    

## 2019-04-24 ENCOUNTER — Other Ambulatory Visit: Payer: Self-pay

## 2019-04-24 ENCOUNTER — Ambulatory Visit: Payer: PRIVATE HEALTH INSURANCE | Attending: General Surgery

## 2019-04-24 DIAGNOSIS — I890 Lymphedema, not elsewhere classified: Secondary | ICD-10-CM | POA: Diagnosis not present

## 2019-04-24 DIAGNOSIS — M79602 Pain in left arm: Secondary | ICD-10-CM

## 2019-04-24 DIAGNOSIS — M25612 Stiffness of left shoulder, not elsewhere classified: Secondary | ICD-10-CM | POA: Diagnosis present

## 2019-04-24 DIAGNOSIS — L599 Disorder of the skin and subcutaneous tissue related to radiation, unspecified: Secondary | ICD-10-CM | POA: Diagnosis present

## 2019-04-24 NOTE — Patient Instructions (Signed)

## 2019-04-24 NOTE — Therapy (Signed)
[place], [place], [address] Phone: [phone]   Fax:  [phone]  Physical Therapy Treatment  Patient Details  Name: Andrea Clarke MRN: [id number] Date of Birth: 07-02-1946 Referring Provider (PT): Ave Filter Date: 04/24/2019  PT End of Session - 04/24/19 0953    Visit Number  10    Number of Visits  13    Date for PT Re-Evaluation  05/13/19    PT Start Time  0902    PT Stop Time  0950    PT Time Calculation (min)  48 min    Activity Tolerance  Patient tolerated treatment well    Behavior During Therapy  [facility] for tasks assessed/performed       Past Medical History:  Diagnosis Date  . Atrial tachycardia ([place])   . Breast cancer ([place])    left breast cancer 2019  . 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 Allegra Lai)  . GERD (gastroesophageal reflux disease)   . Glaucoma   . Hyperlipidemia   . Hypertension   . Personal history of chemotherapy   . Personal history of radiation therapy    finished 02/11/19  . PONV (postoperative nausea and vomiting)     Past Surgical History:  Procedure Laterality Date  . ABDOMINAL HYSTERECTOMY    . ANKLE SURGERY    . APPENDECTOMY    . BREAST BIOPSY     Left  . BREAST LUMPECTOMY Left 12/05/2018  . BREAST LUMPECTOMY WITH RADIOACTIVE SEED AND SENTINEL LYMPH NODE BIOPSY N/A 12/05/2018   Procedure: LEFT BREAST LUMPECTOMY WITH RADIOACTIVE SEED AND LEFT AXILLARY SENTINEL LYMPH NODE BIOPSY AND BLUE DYE INJECTION;  Surgeon: Rolm Bookbinder, MD;  Location: [place];  Service: General;  Laterality: N/A;  . CATARACT EXTRACTION W/ INTRAOCULAR LENS IMPLANT Bilateral   . PORTACATH PLACEMENT Right 09/03/2018   Procedure: INSERTION PORT-A-CATH WITH [place];  Surgeon: Rolm Bookbinder, MD;  Location: [place];  Service: General;  Laterality: Right;  . ROTATOR CUFF REPAIR Right   . TONSILLECTOMY      There were no vitals  filed for this visit.  Subjective Assessment - 04/24/19 0908    Subjective  Overall my lymphedema has done well. I've been wearing my compression garments most days. I did have a flare up yesterday but I hadn't worn my garments the day before so I think that was it. I've been doing my self MLD daily as well and I can tell that's helping alot.    Pertinent History  left breast cancer 12/05/18- left lumpectomy 0/2 nodes negative, 09-03-18- chemotherapy, radiation 01/26/19-02/11/19, right rotator cuff sugery    Patient Stated Goals  to get back to playing golf and doing things I like without pain    Currently in Pain?  No/denies   palm still gets sore with increased swelling        [facility] PT Assessment - 04/24/19 0001      AROM   Left Shoulder Flexion  159 Degrees    Left Shoulder ABduction  172 Degrees    Left Shoulder Internal Rotation  81 Degrees    Left Shoulder External Rotation  77 Degrees        LYMPHEDEMA/ONCOLOGY QUESTIONNAIRE - 04/24/19 0910      Left Upper Extremity Lymphedema   15 cm Proximal to Olecranon Process  28.6 cm    Olecranon Process  24 cm    15 cm Proximal to Ulnar Styloid  Process  23.4 cm    Just Proximal to Ulnar Styloid Process  15.4 cm    Across Hand at [employer]  18.6 cm    At [place] of 2nd Digit  6.2 cm           [facility]patient [facility]  Lymphedema Life Impact Scale Total Score  51.47 %           [facility] Adult PT Treatment/Exercise - 04/24/19 0001      Shoulder Exercises: Supine   Horizontal ABduction  Strengthening;Both;10 reps;Theraband    Theraband Level (Shoulder Horizontal ABduction)  Level 2 (Red)    External Rotation  Strengthening;Both;10 reps;Theraband    Theraband Level (Shoulder External Rotation)  Level 2 (Red)    Flexion  Strengthening;Both;10 reps;Theraband   Narrow and WIde grip, 10 times each   Theraband Level (Shoulder Flexion)  Level 2 (Red)    Diagonals   Strengthening;Right;Left;10 reps;Theraband    Theraband Level (Shoulder Diagonals)  Level 2 (Red)    Diagonals Limitations  Pt returned demo for all above supine exercises with VCs for reinforcing correct technique      Manual Therapy   Manual Lymphatic Drainage (MLD)  short neck, superficial and deep abdominals, right axillary nodes and establishment of anterior inter-axilly pathway, left inguinal nodes and establishment of Lt axillo-inguinal pathway, Lt UE with focus on hand working proximal to distal then retracing all steps    Passive ROM  to left shoulder in direction of flexion and abduction to pts end range             PT Education - 04/24/19 0957    Education Details  Supine scapular series and reissued Strength ABC Program    Person(s) Educated  Patient    Methods  Explanation;Demonstration;Handout    Comprehension  Verbalized understanding;Returned demonstration          PT Long Term Goals - 04/15/19 0815      PT LONG TERM GOAL #1   Title  Pt will be indepdent in self MLD for managmement of LUE swelling.    Baseline  Pt is doing this 1-2x/daily-04/11/29    Status  Achieved      PT LONG TERM GOAL #2   Title  Pt will obtain appropriate compression garments for long term management of lymphedema.    Baseline  Pt has and is wearing these daily now-04/15/19    Status  Achieved      PT LONG TERM GOAL #3   Title  Pt will report 0/10 pain in left arm due to cording to allow her to clean her house without discomfort.    Baseline  2/10 at the most now, was up to 8-9/10 - 04/15/19    Status  Partially Met      PT LONG TERM GOAL #4   Title  Pt will be independent with a home exercise program for continued strengthening and stretching.    Status  On-going      PT LONG TERM GOAL #5   Title  Pt will demonstate 165 degrees of left shoulder flexion and abduction to allow her to returnto prior level of function.    Baseline  L abd 129, L flex 138; flex 167 and abd 173 degrees -  04/15/19    Status  Achieved            Plan - 04/24/19 0954    Clinical Impression Statement  Overall pt is doing  very well with maintaining her lymphedema symptoms. She is independent in self manual lymph drainage and wear of her compression garments. Progressed her HEP to include supine scapular series and reissued handout for Strength ABC program as she reports having lost her copy when moving office to home over weekend. Pt is progressing very well and will be ready for D/C in next 1-2 sessions.    Personal Factors and Comorbidities  Other   hx of chemo and radiation   Examination-Activity Limitations  Lift;Reach Overhead;Carry    Examination-Participation Restrictions  Cleaning    Stability/Clinical Decision Making  Evolving/Moderate complexity    Rehab Potential  Good    PT Frequency  1x / week    PT Duration  4 weeks    PT Treatment/Interventions  ADLs/Self Care Home Management;Therapeutic exercise;Manual techniques;Orthotic Fit/Training;Patient/family education;Manual lymph drainage;Compression bandaging;Scar mobilization;Passive range of motion;Taping    PT Next Visit Plan  Assess circumference and goals at next session. Review HEP prn and determine if ready for D/C in next 1-2 sessions. Remind pt of option of bandaging if she has flare up of lymphedema.    PT Home Exercise Plan  supine dowel exercises; Strength ABC program, supine scapular series    Consulted and Agree with Plan of Care  Patient       Patient will benefit from skilled therapeutic intervention in order to improve the following deficits and impairments:  Increased fascial restricitons, Pain, Postural dysfunction, Impaired UE functional use, Decreased strength, Decreased range of motion, Increased edema  Visit Diagnosis: 1. Lymphedema, not elsewhere classified   2. Stiffness of left shoulder, not elsewhere classified   3. Pain in left arm   4. Disorder of the skin and subcutaneous tissue related to radiation,  unspecified        Problem List Patient Active Problem List   Diagnosis Date Noted  . Port-A-Cath in place 08/31/2018  . Malignant neoplasm of upper-inner quadrant of left breast in female, estrogen receptor negative ([place]) 08/28/2018  . Hypertension   . Hyperlipidemia   . Glaucoma   . GERD (gastroesophageal reflux disease)   . HYPERLIPIDEMIA 06/11/2007  . GLAUCOMA NOS 06/11/2007  . HTN (hypertension) 06/11/2007  . ALLERGIC RHINITIS 06/11/2007  . GERD 06/11/2007  . HERPES ZOSTER, UNCOMPLICATED 24/26/8341    Otelia Limes, PTA 04/24/2019, 9:58 AM  [place], [place], [address] Phone: [phone]   Fax:  [phone]  Name: Andrea Clarke MRN: [id number] Date of Birth: Dec 10, 1945

## 2019-04-29 ENCOUNTER — Ambulatory Visit: Payer: PRIVATE HEALTH INSURANCE

## 2019-04-29 ENCOUNTER — Other Ambulatory Visit: Payer: Self-pay

## 2019-04-29 DIAGNOSIS — I890 Lymphedema, not elsewhere classified: Secondary | ICD-10-CM

## 2019-04-29 DIAGNOSIS — M79602 Pain in left arm: Secondary | ICD-10-CM

## 2019-04-29 DIAGNOSIS — M25612 Stiffness of left shoulder, not elsewhere classified: Secondary | ICD-10-CM

## 2019-04-29 DIAGNOSIS — L599 Disorder of the skin and subcutaneous tissue related to radiation, unspecified: Secondary | ICD-10-CM

## 2019-04-29 NOTE — Therapy (Signed)
[place], [place], [address] Phone: [phone]   Fax:  [phone]  Physical Therapy Treatment  Patient Details  Name: Andrea Clarke MRN: [id number] Date of Birth: 1946-02-10 Referring Provider (PT): Ave Filter Date: 04/29/2019  PT End of Session - 04/29/19 0854    Visit Number  11    Number of Visits  13    Date for PT Re-Evaluation  05/13/19    PT Start Time  0802    PT Stop Time  0849    PT Time Calculation (min)  47 min    Activity Tolerance  Patient tolerated treatment well    Behavior During Therapy  [facility] for tasks assessed/performed       Past Medical History:  Diagnosis Date  . Atrial tachycardia ([place])   . Breast cancer ([place])    left breast cancer 2019  . 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 Allegra Lai)  . GERD (gastroesophageal reflux disease)   . Glaucoma   . Hyperlipidemia   . Hypertension   . Personal history of chemotherapy   . Personal history of radiation therapy    finished 02/11/19  . PONV (postoperative nausea and vomiting)     Past Surgical History:  Procedure Laterality Date  . ABDOMINAL HYSTERECTOMY    . ANKLE SURGERY    . APPENDECTOMY    . BREAST BIOPSY     Left  . BREAST LUMPECTOMY Left 12/05/2018  . BREAST LUMPECTOMY WITH RADIOACTIVE SEED AND SENTINEL LYMPH NODE BIOPSY N/A 12/05/2018   Procedure: LEFT BREAST LUMPECTOMY WITH RADIOACTIVE SEED AND LEFT AXILLARY SENTINEL LYMPH NODE BIOPSY AND BLUE DYE INJECTION;  Surgeon: Rolm Bookbinder, MD;  Location: [place];  Service: General;  Laterality: N/A;  . CATARACT EXTRACTION W/ INTRAOCULAR LENS IMPLANT Bilateral   . PORTACATH PLACEMENT Right 09/03/2018   Procedure: INSERTION PORT-A-CATH WITH [place];  Surgeon: Rolm Bookbinder, MD;  Location: [place];  Service: General;  Laterality: Right;  . ROTATOR CUFF REPAIR Right   . TONSILLECTOMY      There were no vitals  filed for this visit.  Subjective Assessment - 04/29/19 0805    Subjective  I saw Dr. Donne Hazel end of last week and he drained my seroma again and wants to do it again tomorrow, and then possibly Friday. He's hoping if he can get alot out maybe my body will be able to seal it up.Other than that not having any problems, my ROM is doing well. Just feeling that seroma is my bigest complaint. My swelling is also doing well.    Pertinent History  left breast cancer 12/05/18- left lumpectomy 0/2 nodes negative, 09-03-18- chemotherapy, radiation 01/26/19-02/11/19, right rotator cuff sugery    Patient Stated Goals  to get back to playing golf and doing things I like without pain    Currently in Pain?  No/denies                  Outpatient Re[facility] from [facility]  Lymphedema Life Impact Scale Total Score  51.47 %           [facility] Adult PT Treatment/Exercise - 04/29/19 0001      Manual Therapy   Manual Therapy  Manual Lymphatic Drainage (MLD);Passive ROM;Taping    Myofascial Release  to Lt axilla at end P/ROM of flexion and abduction, minimal cording palpated initially but this improved  after a few stretches  and was no longer palpable    Manual Lymphatic Drainage (MLD)  short neck, superficial and deep abdominals, right axillary nodes and establishment of anterior inter-axilly pathway, left inguinal nodes and establishment of Lt axillo-inguinal pathway, Lt UE with focus on hand working proximal to distal then retracing all steps; then into Rt S/L for further work on and around seroma focusing on directing fluid towards Rt axillo-inguinal anastomosis and breifly towards posterior inter-axillary anastomosis    Passive ROM  to left shoulder in direction of flexion and abduction to pts end range briefly as pt now has full ROM and feels little stretch at end of motion    Kinesiotex  Edema      Kinesiotix   Edema  [place] applied then with pt in Rt  S/L and arm OH applied "I" band applied along Lt axillo-inguinal anastomosis instructing pt in this.                  PT Long Term Goals - 04/15/19 0815      PT LONG TERM GOAL #1   Title  Pt will be indepdent in self MLD for managmement of LUE swelling.    Baseline  Pt is doing this 1-2x/daily-04/11/29    Status  Achieved      PT LONG TERM GOAL #2   Title  Pt will obtain appropriate compression garments for long term management of lymphedema.    Baseline  Pt has and is wearing these daily now-04/15/19    Status  Achieved      PT LONG TERM GOAL #3   Title  Pt will report 0/10 pain in left arm due to cording to allow her to clean her house without discomfort.    Baseline  2/10 at the most now, was up to 8-9/10 - 04/15/19    Status  Partially Met      PT LONG TERM GOAL #4   Title  Pt will be independent with a home exercise program for continued strengthening and stretching.    Status  On-going      PT LONG TERM GOAL #5   Title  Pt will demonstate 165 degrees of left shoulder flexion and abduction to allow her to returnto prior level of function.    Baseline  L abd 129, L flex 138; flex 167 and abd 173 degrees - 04/15/19    Status  Achieved            Plan - 04/29/19 0855    Clinical Impression Statement  Focused today on manual lymph drainage over area of seroma and trial of kinesiotape today as well. Seroma did seem to become slightly smaller/softer in size by end of session. Pt agreeable to one more session for further treatment of seroma and may bring her husband to learn kinesiotaping if deemed beneficial. Pt is independent with all HEP at this time and has no further questions.    Personal Factors and Comorbidities  Other   hx of chemo and radiation   Examination-Activity Limitations  Lift;Reach Overhead;Carry    Examination-Participation Restrictions  Cleaning    Stability/Clinical Decision Making  Evolving/Moderate complexity    Rehab Potential  Good    PT  Frequency  1x / week    PT Duration  4 weeks    PT Treatment/Interventions  ADLs/Self Care Home Management;Therapeutic exercise;Manual techniques;Orthotic Fit/Training;Patient/family education;Manual lymph drainage;Compression bandaging;Scar mobilization;Passive range of motion;Taping    PT Next Visit Plan  Reassess all goals and take measurements. See if  kinesiotape was beneficial and instruct husband in this if he comes. Cont MLD to area of seroma. D/C next.    PT Home Exercise Plan  supine dowel exercises; Strength ABC program, supine scapular series; daily self manual lymph drainage    Consulted and Agree with Plan of Care  Patient       Patient will benefit from skilled therapeutic intervention in order to improve the following deficits and impairments:  Increased fascial restricitons, Pain, Postural dysfunction, Impaired UE functional use, Decreased strength, Decreased range of motion, Increased edema  Visit Diagnosis: 1. Lymphedema, not elsewhere classified   2. Stiffness of left shoulder, not elsewhere classified   3. Pain in left arm   4. Disorder of the skin and subcutaneous tissue related to radiation, unspecified        Problem List Patient Active Problem List   Diagnosis Date Noted  . Port-A-Cath in place 08/31/2018  . Malignant neoplasm of upper-inner quadrant of left breast in female, estrogen receptor negative ([place]) 08/28/2018  . Hypertension   . Hyperlipidemia   . Glaucoma   . GERD (gastroesophageal reflux disease)   . HYPERLIPIDEMIA 06/11/2007  . GLAUCOMA NOS 06/11/2007  . HTN (hypertension) 06/11/2007  . ALLERGIC RHINITIS 06/11/2007  . GERD 06/11/2007  . HERPES ZOSTER, UNCOMPLICATED 57/10/7791    Otelia Limes, PTA 04/29/2019, 8:59 AM  [place], [place], [address] Phone: [phone]   Fax:  [phone]  Name: Andrea Clarke MRN: [id number] Date of Birth:  [birth year]

## 2019-05-01 NOTE — Assessment & Plan Note (Addendum)
08/17/2018:Screening mammogram detected abnormality in the left breast by ultrasound irregular mass 10:00 position 1.3 cm, 10 o'clock position no enlarged lymph nodes, ER 0%, PR 0%, Ki-67 80%, HER-2 +3+ by IHC, grade 3, lymphovascular invasion present, T1c N0 stage Ia clinical stage  12/05/2018:Left lumpectomy: No residual invasive carcinoma, margins negative, 0/2 lymph nodes negative, complete pathologic response [id number] Adjuvant radiation therapy: 01/16/2019-02/11/2019  Treatment plan: Herceptin maintenance to complete 1 year of therapy to be completed October 2020 Last echo 03/14/19 with Dr. Haroldine Laws: EF 55-60%  continue PT for her lymphedema.    Return to clinic every 3 weeks for Herceptin every 6 weeks for follow-up with me with labs.

## 2019-05-06 ENCOUNTER — Other Ambulatory Visit: Payer: Self-pay

## 2019-05-06 ENCOUNTER — Ambulatory Visit: Payer: PRIVATE HEALTH INSURANCE

## 2019-05-06 DIAGNOSIS — L599 Disorder of the skin and subcutaneous tissue related to radiation, unspecified: Secondary | ICD-10-CM

## 2019-05-06 DIAGNOSIS — M25612 Stiffness of left shoulder, not elsewhere classified: Secondary | ICD-10-CM

## 2019-05-06 DIAGNOSIS — I890 Lymphedema, not elsewhere classified: Secondary | ICD-10-CM

## 2019-05-06 DIAGNOSIS — M79602 Pain in left arm: Secondary | ICD-10-CM

## 2019-05-06 NOTE — Therapy (Signed)
[place], [place], [address] Phone: [phone]   Fax:  [phone]  Physical Therapy Treatment  Patient Details  Name: Andrea Clarke MRN: [id number] Date of Birth: [birth year] Referring Provider (PT): Ave Filter Date: 05/06/2019  PT End of Session - 05/06/19 0953    Visit Number  12    Number of Visits  13    Date for PT Re-Evaluation  05/13/19    PT Start Time  0901    PT Stop Time  0951    PT Time Calculation (min)  50 min    Activity Tolerance  Patient tolerated treatment well    Behavior During Therapy  [facility] for tasks assessed/performed       Past Medical History:  Diagnosis Date  . Atrial tachycardia ([place])   . Breast cancer ([place])    left breast cancer 2019  . 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 Allegra Lai)  . GERD (gastroesophageal reflux disease)   . Glaucoma   . Hyperlipidemia   . Hypertension   . Personal history of chemotherapy   . Personal history of radiation therapy    finished 02/11/19  . PONV (postoperative nausea and vomiting)     Past Surgical History:  Procedure Laterality Date  . ABDOMINAL HYSTERECTOMY    . ANKLE SURGERY    . APPENDECTOMY    . BREAST BIOPSY     Left  . BREAST LUMPECTOMY Left 12/05/2018  . BREAST LUMPECTOMY WITH RADIOACTIVE SEED AND SENTINEL LYMPH NODE BIOPSY N/A 12/05/2018   Procedure: LEFT BREAST LUMPECTOMY WITH RADIOACTIVE SEED AND LEFT AXILLARY SENTINEL LYMPH NODE BIOPSY AND BLUE DYE INJECTION;  Surgeon: Rolm Bookbinder, MD;  Location: [place];  Service: General;  Laterality: N/A;  . CATARACT EXTRACTION W/ INTRAOCULAR LENS IMPLANT Bilateral   . PORTACATH PLACEMENT Right 09/03/2018   Procedure: INSERTION PORT-A-CATH WITH [place];  Surgeon: Rolm Bookbinder, MD;  Location: [place];  Service: General;  Laterality: Right;  . ROTATOR CUFF REPAIR Right   . TONSILLECTOMY      There were no vitals  filed for this visit.  Subjective Assessment - 05/06/19 0919    Subjective  I saw Dr. Donne Hazel 2x last week to aspirate my seroms. First time was 25 cc's and second was 12 cc's on Friday. He is going to see me next week and if it's still filling up he wants to put another drain in but I really don't want that so I don't know what we are going to do. But hte taping really helped and I brought my husband to learn.    Pertinent History  left breast cancer 12/05/18- left lumpectomy 0/2 nodes negative, 09-03-18- chemotherapy, radiation 01/26/19-02/11/19, right rotator cuff sugery    Patient Stated Goals  to get back to playing golf and doing things I like without pain    Currently in Pain?  No/denies                  Outpatient Rehab from [facility]/19/2019 [facility]  Lymphedema Life Impact Scale Total Score  51.47 %           [facility] Adult PT Treatment/Exercise - 05/06/19 0001      Manual Therapy   Manual Therapy  Manual Lymphatic Drainage (MLD);Passive ROM;Taping    Myofascial Release  to Lt axilla at end P/ROM of flexion and abduction, minimal cording palpated initially but this improved  after a few stretches and was no longer palpable    Manual Lymphatic Drainage (MLD)  short neck, superficial and deep abdominals, right axillary nodes and establishment of anterior inter-axilly pathway, left inguinal nodes and establishment of Lt axillo-inguinal pathway, Lt UE with focus on hand working proximal to distal then retracing all steps; then into Rt S/L for further work on and around seroma focusing on directing fluid towards Rt axillo-inguinal anastomosis and breifly towards posterior inter-axillary anastomosis; briefly instructed husband in this when pt in Lt S/L before he needed to leave for work    Passive ROM  --    Kinesiotex  Edema      Kinesiotix   Edema  With pt seated with Lt arm OH and with Rt side lean applied 2 finger strip along Lt  axillo-inguinal anastomosis instructing husband while applying bring tape to directly posterior to seroma.                  PT Long Term Goals - 05/06/19 0950      PT LONG TERM GOAL #1   Title  Pt will be indepdent in self MLD for managmement of LUE swelling.    Baseline  Pt is doing this 1-2x/daily-04/11/29            Plan - 05/06/19 0900    Clinical Impression Statement  Pts husband was able to come in today to learn how to apply kinesiotape so did this at beginning of session. Also briefly instructed him in manual lymph drainage along posterior inter-axillary and Lt axillo-inguinal anastomosis having him return demo using hand over hand instruction. He had to get to work so demo was brief, instructed pt to review with him this evening while still fresh in his head. She verbalized understanding. Pt has one more visit next week and would like to keep this so he can return if needed for further instruction of MLD and we can assess how they did with kinesiotape at home this week.    Personal Factors and Comorbidities  Other   hx of chemo and radiation   Examination-Activity Limitations  Lift;Reach Overhead;Carry    Examination-Participation Restrictions  Cleaning    Stability/Clinical Decision Making  Evolving/Moderate complexity    Rehab Potential  Good    PT Frequency  1x / week    PT Duration  4 weeks    PT Treatment/Interventions  ADLs/Self Care Home Management;Therapeutic exercise;Manual techniques;Orthotic Fit/Training;Patient/family education;Manual lymph drainage;Compression bandaging;Scar mobilization;Passive range of motion;Taping    PT Next Visit Plan  Reassess all goals and take measurements. See if kinesiotape was beneficial and review with husband if able to come, along with MLD. Cont MLD to area of seroma. D/C next.    PT Home Exercise Plan  supine dowel exercises; Strength ABC program, supine scapular series; daily self manual lymph drainage; kinesiotape     Consulted and Agree with Plan of Care  Patient       Patient will benefit from skilled therapeutic intervention in order to improve the following deficits and impairments:  Increased fascial restricitons, Pain, Postural dysfunction, Impaired UE functional use, Decreased strength, Decreased range of motion, Increased edema  Visit Diagnosis: 1. Lymphedema, not elsewhere classified   2. Stiffness of left shoulder, not elsewhere classified   3. Pain in left arm   4. Disorder of the skin and subcutaneous tissue related to radiation, unspecified        Problem List Patient Active Problem List   Diagnosis Date Noted  .  Port-A-Cath in place 08/31/2018  . Malignant neoplasm of upper-inner quadrant of left breast in female, estrogen receptor negative ([place]) 08/28/2018  . Hypertension   . Hyperlipidemia   . Glaucoma   . GERD (gastroesophageal reflux disease)   . HYPERLIPIDEMIA 06/11/2007  . GLAUCOMA NOS 06/11/2007  . HTN (hypertension) 06/11/2007  . ALLERGIC RHINITIS 06/11/2007  . GERD 06/11/2007  . HERPES ZOSTER, UNCOMPLICATED 88/28/0034    Otelia Limes, PTA 05/06/2019, 10:00 AM  [place], [place], [address] Phone: [phone]   Fax:  [phone]  Name: Andrea Clarke MRN: [id number] Date of Birth: [birth year]

## 2019-05-06 NOTE — Progress Notes (Signed)
Patient Care Team: [name], MD as PCP - General (Internal Medicine) Rolm Bookbinder, MD as Consulting Physician (General Surgery)  DIAGNOSIS:    ICD-10-CM   1. Elevated LFTs  R94.5 US Abdomen Complete  2. Malignant neoplasm of upper-inner quadrant of left breast in female, estrogen receptor negative ([place])  C50.212    Z17.1     SUMMARY OF ONCOLOGIC HISTORY: Oncology History  Malignant neoplasm of upper-inner quadrant of left breast in female, estrogen receptor negative ([place])  08/17/2018 Initial Diagnosis   Screening mammogram detected abnormality in the left breast by ultrasound irregular mass 10:00 position 1.3 cm, 10 o'clock position no enlarged lymph nodes, ER 0%, PR 0%, Ki-67 80%, HER-2 +3+ [facility]   08/28/2018 Cancer Staging   Staging form: Breast, AJCC 8th Edition - Clinical stage from 08/28/2018: Stage IA (cT1c, cN0, cM0, G3, ER-, PR-, HER2+) - Signed by Nicholas Lose, MD on 08/28/2018   08/28/2018 Breast MRI   malignancy within the UPPER INNER LEFT breast with dominant mass measuring 2.1 cm. With small adjacent satellite nodules, the entire area 1.5 x 3 x 2.2 cm.No evidence of abnormal lymph nodes or RIGHT breast malignancy.   09/03/2018 -  Chemotherapy   Weekly Taxol and Herceptin followed by maintenance Herceptin for one year.    12/05/2018 Surgery   Left lumpectomy: No residual invasive carcinoma, margins negative, 0/2 lymph nodes negative, complete pathologic response [id number]     CHIEF COMPLIANT: Herceptin maintenance  INTERVAL HISTORY: Andrea Clarke is a 73 y.o. with above-mentioned history of left breast cancer who underwent neoadjuvant chemotherapy, followed by lumpectomy, radiation, and is currently on maintenance Herceptin. She is currently in PT for lymphedema. She presents to the clinic today for treatment.  We have noticed that her LFTs have been trending high.  REVIEW OF SYSTEMS:   Constitutional: Denies fevers, chills or abnormal weight  loss Eyes: Denies blurriness of vision Ears, nose, mouth, throat, and face: Denies mucositis or sore throat Respiratory: Denies cough, dyspnea or wheezes Cardiovascular: Denies palpitation, chest discomfort Gastrointestinal: Denies nausea, heartburn or change in bowel habits Skin: Denies abnormal skin rashes Lymphatics: Denies new lymphadenopathy or easy bruising Neurological: Denies numbness, tingling or new weaknesses Behavioral/Psych: Mood is stable, no new changes  Extremities: No lower extremity edema Breast: denies any pain or lumps or nodules in either breasts All other systems were reviewed with the patient and are negative.  I have reviewed the past medical history, past surgical history, social history and family history with the patient and they are unchanged from previous note.  ALLERGIES:  is allergic to adhesive [tape]; oxycodone hcl; augmentin [amoxicillin-pot clavulanate]; and xylocaine [lidocaine hcl].  MEDICATIONS:  Current Outpatient Medications  Medication Sig Dispense Refill  . acetaminophen (TYLENOL) 500 MG tablet Take 1,000 mg by mouth daily as needed for moderate pain or headache.     Marland Kitchen atorvastatin (LIPITOR) 20 MG tablet Take 20 mg by mouth daily.    . brimonidine-timolol (COMBIGAN) 0.2-0.5 % ophthalmic solution Place 1 drop into both eyes every 12 (twelve) hours.    Marland Kitchen CARTIA XT 120 MG 24 hr capsule TAKE ONE CAPSULE BY MOUTH DAILY, STOPPING METOPROLOL 90 capsule 3  . cetirizine (ZYRTEC) 10 MG tablet Take 10 mg by mouth daily.    . Cholecalciferol (VITAMIN D3) 125 MCG (5000 UT) CAPS Take 5,000 Units by mouth at bedtime.     . Coenzyme Q10 10 MG capsule Take 10 mg by mouth at bedtime.    Marland Kitchen dexlansoprazole (DEXILANT)  60 MG capsule Take 60 mg by mouth daily.    . flecainide (TAMBOCOR) 100 MG tablet TAKE 1 TABLET BY MOUTH TWICE DAILY 180 tablet 0  . fluticasone (FLONASE) 50 MCG/ACT nasal spray Place 1 spray into both nostrils daily as needed for allergies.     .  furosemide (LASIX) 20 MG tablet TAKE 1 TABLET(20 MG) BY MOUTH DAILY 30 tablet 0  . Krill Oil 500 MG CAPS Take 500 mg by mouth at bedtime.    . lidocaine-prilocaine (EMLA) cream Apply to affected area once 30 g 3  . LORazepam (ATIVAN) 0.5 MG tablet Take 1 tablet (0.5 mg total) by mouth at bedtime as needed (Nausea or vomiting). (Patient not taking: Reported on 12/20/2018) 30 tablet 0  . montelukast (SINGULAIR) 10 MG tablet Take 10 mg by mouth daily.    . Multiple Vitamin (MULTIVITAMIN) tablet Take 1 tablet by mouth at bedtime.     . ondansetron (ZOFRAN) 8 MG tablet Take 1 tablet (8 mg total) by mouth 2 (two) times daily as needed (Nausea or vomiting). (Patient not taking: Reported on 12/20/2018) 30 tablet 1  . Polyethyl Glycol-Propyl Glycol (SYSTANE OP) Place 1 drop into both eyes 2 (two) times daily as needed (dry eyes).    . prochlorperazine (COMPAZINE) 10 MG tablet TAKE 1 TABLET(10 MG) BY MOUTH EVERY 6 HOURS AS NEEDED FOR NAUSEA OR VOMITING (Patient not taking: No sig reported) 385 tablet 1  . quinapril (ACCUPRIL) 20 MG tablet Take 1 tablet (20 mg total) by mouth daily. Please make annual appt with Dr. Johnsie Cancel for future refills. Thank you 90 tablet 0  . traMADol (ULTRAM) 50 MG tablet Take 2 tablets (100 mg total) by mouth every 6 (six) hours as needed. (Patient not taking: Reported on 12/20/2018) 12 tablet 0   No current facility-administered medications for this visit.    Facility-Administered Medications Ordered in Other Visits  Medication Dose Route Frequency Provider Last Rate Last Dose  . sodium chloride flush (NS) 0.9 % injection 10 mL  10 mL Intracatheter Once Nicholas Lose, MD        PHYSICAL EXAMINATION: ECOG PERFORMANCE STATUS: 1 - Symptomatic but completely ambulatory  Vitals:   05/07/19 0853  BP: (!) 150/72  Pulse: (!) 53  Resp: 18  Temp: 98.9 F (37.2 C)  SpO2: 96%   Filed Weights   05/07/19 0853  Weight: 162 lb 14.4 oz (73.9 kg)    GENERAL: alert, no distress and  comfortable SKIN: skin color, texture, turgor are normal, no rashes or significant lesions EYES: normal, Conjunctiva are pink and non-injected, sclera clear OROPHARYNX: no exudate, no erythema and lips, buccal mucosa, and tongue normal  NECK: supple, thyroid normal size, non-tender, without nodularity LYMPH: no palpable lymphadenopathy in the cervical, axillary or inguinal LUNGS: clear to auscultation and percussion with normal breathing effort HEART: regular rate & rhythm and no murmurs and no lower extremity edema ABDOMEN: abdomen soft, non-tender and normal bowel sounds MUSCULOSKELETAL: no cyanosis of digits and no clubbing  NEURO: alert & oriented x 3 with fluent speech, no focal motor/sensory deficits EXTREMITIES: No lower extremity edema  LABORATORY DATA:  I have reviewed the data as listed CMP Latest Ref Rng & Units 04/16/2019 03/26/2019 03/05/2019  Glucose 70 - 99 mg/dL 142(H) 144(H) 149(H)  BUN 8 - 23 mg/dL _0 Creatinine 0.44 - 1.00 mg/dL 0.73 0.72 0.75  Sodium 135 - 145 mmol/L 139 139 140  Potassium 3.5 - 5.1 mmol/L 4.3 4.5 4.2  Chloride 98 - 111 mmol/L 104 106 105  CO2 22 - 32 mmol/L _0 Calcium 8.9 - 10.3 mg/dL 8.9 8.8(L) 8.7(L)  Total Protein 6.5 - 8.1 g/dL 6.4(L) 6.3(L) 6.0(L)  Total Bilirubin 0.3 - 1.2 mg/dL 0.7 0.7 0.6  Alkaline Phos 38 - 126 U/L 96 84 84  AST 15 - 41 U/L 87(H) 75(H) 76(H)  ALT 0 - 44 U/L 121(H) 109(H) 109(H)    Lab Results  Component Value Date   WBC 5.1 05/07/2019   HGB 14.2 05/07/2019   HCT 40.5 05/07/2019   MCV 99.3 05/07/2019   PLT 174 05/07/2019   NEUTROABS 3.2 05/07/2019    ASSESSMENT & PLAN:  Malignant neoplasm of upper-inner quadrant of left breast in female, estrogen receptor negative ([place]) 08/17/2018:Screening mammogram detected abnormality in the left breast by ultrasound irregular mass 10:00 position 1.3 cm, 10 o'clock position no enlarged lymph nodes, ER 0%, PR 0%, Ki-67 80%, HER-2 +3+ by IHC, grade 3, lymphovascular  invasion present, T1c N0 stage Ia clinical stage  12/05/2018:Left lumpectomy: No residual invasive carcinoma, margins negative, 0/2 lymph nodes negative, complete pathologic response [id number] Adjuvant radiation therapy: 01/16/2019-02/11/2019  Treatment plan: Herceptin maintenance to complete 1 year of therapy to be completed October 2020 Last echo 03/14/19 with Dr. Haroldine Laws: EF 55-60%  continue PT for her lymphedema.   Elevated LFTs: I suspect fatty liver as the etiology.  I instructed her to start exercising.  We will get an ultrasound of the liver for further evaluation.  Return to clinic every 3 weeks for Herceptin every 6 weeks for follow-up with me with labs.     Orders Placed This Encounter  Procedures  . US Abdomen Complete    Standing Status:   Future    Standing Expiration Date:   06/10/2020    Order Specific Question:   Reason for exam:    Answer:   Elevated LFTs    Order Specific Question:   Preferred imaging location?    Answer:   [facility]   The patient has a good understanding of the overall plan. she agrees with it. she will call with any problems that may develop before the next visit here.  Nicholas Lose, MD 05/07/2019  Julious Oka Dorshimer am acting as scribe for Dr. Nicholas Lose.  I have reviewed the above documentation for accuracy and completeness, and I agree with the above.

## 2019-05-07 ENCOUNTER — Inpatient Hospital Stay (HOSPITAL_BASED_OUTPATIENT_CLINIC_OR_DEPARTMENT_OTHER): Payer: PRIVATE HEALTH INSURANCE | Admitting: Hematology and Oncology

## 2019-05-07 ENCOUNTER — Other Ambulatory Visit: Payer: Self-pay

## 2019-05-07 ENCOUNTER — Inpatient Hospital Stay: Payer: PRIVATE HEALTH INSURANCE | Attending: Hematology and Oncology

## 2019-05-07 ENCOUNTER — Inpatient Hospital Stay: Payer: PRIVATE HEALTH INSURANCE

## 2019-05-07 ENCOUNTER — Other Ambulatory Visit: Payer: Self-pay | Admitting: Hematology and Oncology

## 2019-05-07 VITALS — BP 150/72 | HR 53 | Temp 98.9°F | Resp 18 | Ht 63.0 in | Wt 162.9 lb

## 2019-05-07 DIAGNOSIS — R7989 Other specified abnormal findings of blood chemistry: Secondary | ICD-10-CM | POA: Diagnosis not present

## 2019-05-07 DIAGNOSIS — Z5112 Encounter for antineoplastic immunotherapy: Secondary | ICD-10-CM | POA: Insufficient documentation

## 2019-05-07 DIAGNOSIS — Z171 Estrogen receptor negative status [ER-]: Secondary | ICD-10-CM | POA: Diagnosis not present

## 2019-05-07 DIAGNOSIS — C50212 Malignant neoplasm of upper-inner quadrant of left female breast: Secondary | ICD-10-CM

## 2019-05-07 DIAGNOSIS — Z79899 Other long term (current) drug therapy: Secondary | ICD-10-CM

## 2019-05-07 DIAGNOSIS — Z95828 Presence of other vascular implants and grafts: Secondary | ICD-10-CM

## 2019-05-07 DIAGNOSIS — I890 Lymphedema, not elsewhere classified: Secondary | ICD-10-CM

## 2019-05-07 LAB — CMP (CANCER CENTER ONLY)
ALT: 164 U/L — ABNORMAL HIGH (ref 0–44)
AST: 129 U/L — ABNORMAL HIGH (ref 15–41)
Albumin: 3.6 g/dL (ref 3.5–5.0)
Alkaline Phosphatase: 101 U/L (ref 38–126)
Anion gap: 12 (ref 5–15)
BUN: 14 mg/dL (ref 8–23)
CO2: 24 mmol/L (ref 22–32)
Calcium: 9.2 mg/dL (ref 8.9–10.3)
Chloride: 105 mmol/L (ref 98–111)
Creatinine: 0.75 mg/dL (ref 0.44–1.00)
GFR, Est AFR Am: >60 mL/min (ref >60)
GFR, Estimated: >60 mL/min (ref >60)
Glucose, Bld: 143 mg/dL — ABNORMAL HIGH (ref 70–99)
Potassium: 4.6 mmol/L (ref 3.5–5.1)
Sodium: 141 mmol/L (ref 135–145)
Total Bilirubin: 0.7 mg/dL (ref 0.3–1.2)
Total Protein: 6.6 g/dL (ref 6.5–8.1)

## 2019-05-07 LAB — CBC WITH DIFFERENTIAL (CANCER CENTER ONLY)
Abs Immature Granulocytes: 0.01 10*3/uL (ref 0.00–0.07)
Basophils Absolute: 0 10*3/uL (ref 0.0–0.1)
Basophils Relative: 1 %
Eosinophils Absolute: 0.3 10*3/uL (ref 0.0–0.5)
Eosinophils Relative: 7 %
HCT: 40.5 % (ref 36.0–46.0)
Hemoglobin: 14.2 g/dL (ref 12.0–15.0)
Immature Granulocytes: 0 %
Lymphocytes Relative: 21 %
Lymphs Abs: 1.1 10*3/uL (ref 0.7–4.0)
MCH: 34.8 pg — ABNORMAL HIGH (ref 26.0–34.0)
MCHC: 35.1 g/dL (ref 30.0–36.0)
MCV: 99.3 fL (ref 80.0–100.0)
Monocytes Absolute: 0.4 10*3/uL (ref 0.1–1.0)
Monocytes Relative: 9 %
Neutro Abs: 3.2 10*3/uL (ref 1.7–7.7)
Neutrophils Relative %: 62 %
Platelet Count: 174 10*3/uL (ref 150–400)
RBC: 4.08 MIL/uL (ref 3.87–5.11)
RDW: 12.6 % (ref 11.5–15.5)
WBC Count: 5.1 10*3/uL (ref 4.0–10.5)
nRBC: 0 % (ref 0.0–0.2)

## 2019-05-07 MED ORDER — ACETAMINOPHEN 325 MG PO TABS
650.0000 mg | ORAL_TABLET | Freq: Once | ORAL | Status: AC
  Administered 2019-05-07: 650 mg via ORAL

## 2019-05-07 MED ORDER — DIPHENHYDRAMINE HCL 50 MG/ML IJ SOLN
25.0000 mg | Freq: Once | INTRAMUSCULAR | Status: AC
  Administered 2019-05-07: 25 mg via INTRAVENOUS

## 2019-05-07 MED ORDER — DIPHENHYDRAMINE HCL 25 MG PO CAPS
ORAL_CAPSULE | ORAL | Status: AC
  Filled 2019-05-07: qty 1

## 2019-05-07 MED ORDER — SODIUM CHLORIDE 0.9 % IV SOLN
Freq: Once | INTRAVENOUS | Status: AC
  Administered 2019-05-07: 10:00:00 via INTRAVENOUS
  Filled 2019-05-07: qty 250

## 2019-05-07 MED ORDER — HEPARIN SOD (PORK) LOCK FLUSH 100 UNIT/ML IV SOLN
500.0000 [IU] | Freq: Once | INTRAVENOUS | Status: AC | PRN
  Administered 2019-05-07: 500 [IU]
  Filled 2019-05-07: qty 5

## 2019-05-07 MED ORDER — DIPHENHYDRAMINE HCL 50 MG/ML IJ SOLN
INTRAMUSCULAR | Status: AC
  Filled 2019-05-07: qty 1

## 2019-05-07 MED ORDER — ACETAMINOPHEN 325 MG PO TABS
ORAL_TABLET | ORAL | Status: AC
  Filled 2019-05-07: qty 2

## 2019-05-07 MED ORDER — TRASTUZUMAB CHEMO 150 MG IV SOLR
450.0000 mg | Freq: Once | INTRAVENOUS | Status: AC
  Administered 2019-05-07: 450 mg via INTRAVENOUS
  Filled 2019-05-07: qty 21.43

## 2019-05-07 MED ORDER — SODIUM CHLORIDE 0.9% FLUSH
10.0000 mL | Freq: Once | INTRAVENOUS | Status: AC
  Administered 2019-05-07: 10 mL
  Filled 2019-05-07: qty 10

## 2019-05-07 MED ORDER — SODIUM CHLORIDE 0.9% FLUSH
10.0000 mL | INTRAVENOUS | Status: DC | PRN
  Administered 2019-05-07: 11:00:00 10 mL
  Filled 2019-05-07: qty 10

## 2019-05-07 NOTE — Patient Instructions (Signed)
[facility] Discharge Instructions for Patients Receiving Chemotherapy  Today you received the following chemotherapy agents Trastuzumab (HERCEPTIN).  To help prevent nausea and vomiting after your treatment, we encourage you to take your nausea medication as prescribed.  If you develop nausea and vomiting that is not controlled by your nausea medication, call the clinic.   BELOW ARE SYMPTOMS THAT SHOULD BE REPORTED IMMEDIATELY:  *FEVER GREATER THAN 100.5 F  *CHILLS WITH OR WITHOUT FEVER  NAUSEA AND VOMITING THAT IS NOT CONTROLLED WITH YOUR NAUSEA MEDICATION  *UNUSUAL SHORTNESS OF BREATH  *UNUSUAL BRUISING OR BLEEDING  TENDERNESS IN MOUTH AND THROAT WITH OR WITHOUT PRESENCE OF ULCERS  *URINARY PROBLEMS  *BOWEL PROBLEMS  UNUSUAL RASH Items with * indicate a potential emergency and should be followed up as soon as possible.  Feel free to call the clinic should you have any questions or concerns. The clinic phone number is [phone].  Please show the CHEMO ALERT CARD at check-in to the Emergency Department and triage nurse.  Coronavirus (COVID-19) Are you at risk?  Are you at risk for the Coronavirus (COVID-19)?  To be considered HIGH RISK for Coronavirus (COVID-19), you have to meet the following criteria:  . Traveled to [place], [place], [place], [place] or [place]; or in the [place] to [place], [place], [place], or [place]; and have fever, cough, and shortness of breath within the last 2 weeks of travel OR . Been in close contact with a person diagnosed with COVID-19 within the last 2 weeks and have fever, cough, and shortness of breath . IF YOU DO NOT MEET THESE CRITERIA, YOU ARE CONSIDERED LOW RISK FOR COVID-19.  What to do if you are HIGH RISK for COVID-19?  . If you are having a medical emergency, call 911. . Seek medical care right away. Before you go to a doctor's office, urgent care or emergency department, call ahead and tell them  about your recent travel, contact with someone diagnosed with COVID-19, and your symptoms. You should receive instructions from your physician's office regarding next steps of care.  . When you arrive at healthcare provider, tell the healthcare staff immediately you have returned from visiting [place], [place], [place], [place] or [place]; or traveled in the [place] to [place], [place], [place], or [place]; in the last two weeks or you have been in close contact with a person diagnosed with COVID-19 in the last 2 weeks.   . Tell the health care staff about your symptoms: fever, cough and shortness of breath. . After you have been seen by a medical provider, you will be either: o Tested for (COVID-19) and discharged home on quarantine except to seek medical care if symptoms worsen, and asked to  - Stay home and avoid contact with others until you get your results (4-5 days)  - Avoid travel on public transportation if possible (such as bus, train, or airplane) or o Sent to the Emergency Department by EMS for evaluation, COVID-19 testing, and possible admission depending on your condition and test results.  What to do if you are LOW RISK for COVID-19?  Reduce your risk of any infection by using the same precautions used for avoiding the common cold or flu:  . Wash your hands often with soap and warm water for at least 20 seconds.  If soap and water are not readily available, use an alcohol-based hand sanitizer with at least 60% alcohol.  . If coughing or sneezing,   cover your mouth and nose by coughing or sneezing into the elbow areas of your shirt or coat, into a tissue or into your sleeve (not your hands). . Avoid shaking hands with others and consider head nods or verbal greetings only. . Avoid touching your eyes, nose, or mouth with unwashed hands.  . Avoid close contact with people who are sick. . Avoid places or events with large numbers of people in one location, like concerts or  sporting events. . Carefully consider travel plans you have or are making. . If you are planning any travel outside or inside the [place], visit the CDC's Travelers' Health webpage for the latest health notices. . If you have some symptoms but not all symptoms, continue to monitor at home and seek medical attention if your symptoms worsen. . If you are having a medical emergency, call 911.   ADDITIONAL HEALTHCARE OPTIONS FOR PATIENTS  [facility] / e-Visit: [email or web address]         [facility]: [phone]  [facility]: [phone]                   [facility]: [phone]   

## 2019-05-13 ENCOUNTER — Other Ambulatory Visit: Payer: Self-pay

## 2019-05-13 ENCOUNTER — Ambulatory Visit: Payer: PRIVATE HEALTH INSURANCE

## 2019-05-13 DIAGNOSIS — M79602 Pain in left arm: Secondary | ICD-10-CM

## 2019-05-13 DIAGNOSIS — I890 Lymphedema, not elsewhere classified: Secondary | ICD-10-CM | POA: Diagnosis not present

## 2019-05-13 DIAGNOSIS — L599 Disorder of the skin and subcutaneous tissue related to radiation, unspecified: Secondary | ICD-10-CM

## 2019-05-13 DIAGNOSIS — M25612 Stiffness of left shoulder, not elsewhere classified: Secondary | ICD-10-CM

## 2019-05-13 NOTE — Therapy (Addendum)
[place], [place], [address] Phone: [phone]   Fax:  [phone]  Physical Therapy Treatment  Patient Details  Name: Andrea Clarke MRN: [id number] Date of Birth: Jul 11, 1946 Referring Provider (PT): Ave Filter Date: 05/13/2019  PT End of Session - 05/13/19 0857    Visit Number  13    Number of Visits  13    Date for PT Re-Evaluation  05/13/19    PT Start Time  0800    PT Stop Time  5852    PT Time Calculation (min)  54 min    Activity Tolerance  Patient tolerated treatment well    Behavior During Therapy  [facility] for tasks assessed/performed       Past Medical History:  Diagnosis Date  . Atrial tachycardia ([place])   . Breast cancer ([place])    left breast cancer 2019  . 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 Allegra Lai)  . GERD (gastroesophageal reflux disease)   . Glaucoma   . Hyperlipidemia   . Hypertension   . Personal history of chemotherapy   . Personal history of radiation therapy    finished 02/11/19  . PONV (postoperative nausea and vomiting)     Past Surgical History:  Procedure Laterality Date  . ABDOMINAL HYSTERECTOMY    . ANKLE SURGERY    . APPENDECTOMY    . BREAST BIOPSY     Left  . BREAST LUMPECTOMY Left 12/05/2018  . BREAST LUMPECTOMY WITH RADIOACTIVE SEED AND SENTINEL LYMPH NODE BIOPSY N/A 12/05/2018   Procedure: LEFT BREAST LUMPECTOMY WITH RADIOACTIVE SEED AND LEFT AXILLARY SENTINEL LYMPH NODE BIOPSY AND BLUE DYE INJECTION;  Surgeon: Rolm Bookbinder, MD;  Location: [facility];  Service: General;  Laterality: N/A;  . CATARACT EXTRACTION W/ INTRAOCULAR LENS IMPLANT Bilateral   . PORTACATH PLACEMENT Right 09/03/2018   Procedure: INSERTION PORT-A-CATH WITH [place];  Surgeon: Rolm Bookbinder, MD;  Location: [facility];  Service: General;  Laterality: Right;  . ROTATOR CUFF REPAIR Right   . TONSILLECTOMY      There were no vitals  filed for this visit.  Subjective Assessment - 05/13/19 0806    Subjective  The seroma is still there and I see Dr. Donne Hazel today for [place] to decide what we will do about the seroma. My swelling though for my Lt UE is doing really well. I am ready to D/C.    Pertinent History  left breast cancer 12/05/18- left lumpectomy 0/2 nodes negative, 09-03-18- chemotherapy, radiation 01/26/19-02/11/19, right rotator cuff sugery    Patient Stated Goals  to get back to playing golf and doing things I like without pain    Currently in Pain?  No/denies            LYMPHEDEMA/ONCOLOGY QUESTIONNAIRE - 05/13/19 0814      Left Upper Extremity Lymphedema   15 cm Proximal to Olecranon Process  28.2 cm    Olecranon Process  24.1 cm    15 cm Proximal to Ulnar Styloid Process  23.4 cm    Just Proximal to Ulnar Styloid Process  15.3 cm    Across Hand at [employer]  18.7 cm    At [place] of 2nd Digit  6.2 cm           Outpatient Rehab from [facility]/19/[facility]  Lymphedema Life Impact Scale Total Score  51.47 %  [facility] Adult PT Treatment/Exercise - 05/13/19 0001      Manual Therapy   Myofascial Release  to Lt axilla at end P/ROM of flexion and abduction, minimal cording palpated initially but this improved  after a few stretches and was no longer palpable    Manual Lymphatic Drainage (MLD)  short neck, superficial and deep abdominals, right axillary nodes and establishment of anterior inter-axilly pathway, left inguinal nodes and establishment of Lt axillo-inguinal pathway, Lt UE with focus on hand working proximal to distal then retracing all steps; then into Rt S/L for further work on and around seroma focusing on directing fluid towards Rt axillo-inguinal anastomosis and breifly towards posterior inter-axillary anastomosis    Kinesiotex  Edema      Kinesiotix   Edema  With pt seated with Lt arm OH and with Rt side lean applied 2 finger strip along Lt  axillo-inguinal anastomosis                  PT Long Term Goals - 05/13/19 8850      PT LONG TERM GOAL #1   Title  Pt will be indepdent in self MLD for managmement of LUE swelling.    Baseline  Pt is doing this 1-2x/daily-04/11/29; pt is independent with this-05/13/19    Status  Achieved      PT LONG TERM GOAL #2   Title  Pt will obtain appropriate compression garments for long term management of lymphedema.    Baseline  Pt has and is wearing these daily now-04/15/19    Status  Achieved      PT LONG TERM GOAL #3   Title  Pt will report 0/10 pain in left arm due to cording to allow her to clean her house without discomfort.    Baseline  2/10 at the most now, was up to 8-9/10 - 04/15/19; only feels this now with [facility] reaching so no problem with ADLs-05/13/19    Status  Achieved      PT LONG TERM GOAL #4   Title  Pt will be independent with a home exercise program for continued strengthening and stretching.    Status  Achieved      PT LONG TERM GOAL #5   Title  Pt will demonstate 165 degrees of left shoulder flexion and abduction to allow her to returnto prior level of function.    Baseline  L abd 129, L flex 138; flex 167 and abd 173 degrees - 04/15/19; flex 170 and abduction 173 degrees-05/13/19    Status  Achieved            Plan - 05/13/19 0904    Clinical Impression Statement  Pt has done very well with this episode of therapy, having met all goals. She is also able to manage her lymphedema symptoms, though did need reminding today to wear her compression garments when walking as she didn't this weekend and noticed slight increase at her forearm. She verbalized understanding and is now ready for D/C.    Personal Factors and Comorbidities  Other   hx of chemo and radiation   Examination-Activity Limitations  Lift;Reach Overhead;Carry    Examination-Participation Restrictions  Cleaning    Stability/Clinical Decision Making  Evolving/Moderate complexity    Rehab Potential   Good    PT Frequency  1x / week    PT Duration  4 weeks    PT Treatment/Interventions  ADLs/Self Care Home Management;Therapeutic exercise;Manual techniques;Orthotic Fit/Training;Patient/family education;Manual lymph drainage;Compression bandaging;Scar mobilization;Passive range of motion;Taping  PT Next Visit Plan  D/C this visit.    PT Home Exercise Plan  supine dowel exercises; Strength ABC program, supine scapular series; daily self manual lymph drainage; kinesiotape    Consulted and Agree with Plan of Care  Patient       Patient will benefit from skilled therapeutic intervention in order to improve the following deficits and impairments:  Increased fascial restricitons, Pain, Postural dysfunction, Impaired UE functional use, Decreased strength, Decreased range of motion, Increased edema  Visit Diagnosis: 1. Lymphedema, not elsewhere classified   2. Stiffness of left shoulder, not elsewhere classified   3. Pain in left arm   4. Disorder of the skin and subcutaneous tissue related to radiation, unspecified        Problem List Patient Active Problem List   Diagnosis Date Noted  . Port-A-Cath in place 08/31/2018  . Malignant neoplasm of upper-inner quadrant of left breast in female, estrogen receptor negative ([place]) 08/28/2018  . Hypertension   . Hyperlipidemia   . Glaucoma   . GERD (gastroesophageal reflux disease)   . HYPERLIPIDEMIA 06/11/2007  . GLAUCOMA NOS 06/11/2007  . HTN (hypertension) 06/11/2007  . ALLERGIC RHINITIS 06/11/2007  . GERD 06/11/2007  . HERPES ZOSTER, UNCOMPLICATED 09/81/1914    Otelia Limes, PTA 05/13/2019, 9:07 AM  [place], [place], [address] Phone: [phone]   Fax:  [phone]  Name: Mikhaila Roh MRN: [id number] Date of Birth: Mar 20, 1946  PHYSICAL THERAPY DISCHARGE SUMMARY  Visits from Start of Care: 13  Current functional level  related to goals / functional outcomes: See above all goals met   Remaining deficits: UE cording   Education / Equipment: HEP, lymphedema self care Plan: Patient agrees to discharge.  Patient goals were met. Patient is being discharged due to meeting the stated rehab goals.  ?????    Shan Levans, PT

## 2019-05-14 ENCOUNTER — Ambulatory Visit (HOSPITAL_COMMUNITY)
Admission: RE | Admit: 2019-05-14 | Discharge: 2019-05-14 | Disposition: A | Discharge status: Home / Self Care | Payer: PRIVATE HEALTH INSURANCE | Source: Ambulatory Visit | Attending: Hematology and Oncology | Admitting: Hematology and Oncology

## 2019-05-14 DIAGNOSIS — R945 Abnormal results of liver function studies: Secondary | ICD-10-CM | POA: Diagnosis not present

## 2019-05-14 DIAGNOSIS — R7989 Other specified abnormal findings of blood chemistry: Secondary | ICD-10-CM

## 2019-05-19 NOTE — Progress Notes (Signed)
The following biosimilar Ogivri (trastuzumab-dkst) has been selected for use in this patient.  [facility], Pharm.D., CPP 05/19/2019@8 :49 AM

## 2019-05-24 ENCOUNTER — Other Ambulatory Visit: Payer: Self-pay | Admitting: Cardiovascular Disease

## 2019-05-28 ENCOUNTER — Other Ambulatory Visit: Payer: Self-pay

## 2019-05-28 ENCOUNTER — Inpatient Hospital Stay: Payer: PRIVATE HEALTH INSURANCE | Attending: Hematology and Oncology

## 2019-05-28 DIAGNOSIS — C50212 Malignant neoplasm of upper-inner quadrant of left female breast: Secondary | ICD-10-CM | POA: Diagnosis present

## 2019-05-28 DIAGNOSIS — Z5112 Encounter for antineoplastic immunotherapy: Secondary | ICD-10-CM | POA: Insufficient documentation

## 2019-05-28 DIAGNOSIS — Z171 Estrogen receptor negative status [ER-]: Secondary | ICD-10-CM

## 2019-05-28 MED ORDER — HEPARIN SOD (PORK) LOCK FLUSH 100 UNIT/ML IV SOLN
500.0000 [IU] | Freq: Once | INTRAVENOUS | Status: AC | PRN
  Administered 2019-05-28: 500 [IU]
  Filled 2019-05-28: qty 5

## 2019-05-28 MED ORDER — DIPHENHYDRAMINE HCL 50 MG/ML IJ SOLN
INTRAMUSCULAR | Status: AC
  Filled 2019-05-28: qty 1

## 2019-05-28 MED ORDER — SODIUM CHLORIDE 0.9% FLUSH
10.0000 mL | INTRAVENOUS | Status: DC | PRN
  Administered 2019-05-28: 12:00:00 10 mL
  Filled 2019-05-28: qty 10

## 2019-05-28 MED ORDER — SODIUM CHLORIDE 0.9 % IV SOLN
Freq: Once | INTRAVENOUS | Status: AC
  Administered 2019-05-28: 10:00:00 via INTRAVENOUS
  Filled 2019-05-28: qty 250

## 2019-05-28 MED ORDER — DIPHENHYDRAMINE HCL 50 MG/ML IJ SOLN
25.0000 mg | Freq: Once | INTRAMUSCULAR | Status: AC
  Administered 2019-05-28: 10:00:00 25 mg via INTRAVENOUS

## 2019-05-28 MED ORDER — TRASTUZUMAB-DKST CHEMO 150 MG IV SOLR
450.0000 mg | Freq: Once | INTRAVENOUS | Status: AC
  Administered 2019-05-28: 450 mg via INTRAVENOUS
  Filled 2019-05-28: qty 21.43

## 2019-05-28 MED ORDER — ACETAMINOPHEN 325 MG PO TABS
650.0000 mg | ORAL_TABLET | Freq: Once | ORAL | Status: AC
  Administered 2019-05-28: 650 mg via ORAL

## 2019-05-28 MED ORDER — ACETAMINOPHEN 325 MG PO TABS
ORAL_TABLET | ORAL | Status: AC
  Filled 2019-05-28: qty 2

## 2019-05-28 NOTE — Patient Instructions (Signed)
[facility] Discharge Instructions for Patients Receiving Chemotherapy  Today you received the following Immunotherapy:  Herceptin  To help prevent nausea and vomiting after your treatment, we encourage you to take your nausea medication as directed by your MD.   If you develop nausea and vomiting that is not controlled by your nausea medication, call the clinic.   BELOW ARE SYMPTOMS THAT SHOULD BE REPORTED IMMEDIATELY:  *FEVER GREATER THAN 100.5 F  *CHILLS WITH OR WITHOUT FEVER  NAUSEA AND VOMITING THAT IS NOT CONTROLLED WITH YOUR NAUSEA MEDICATION  *UNUSUAL SHORTNESS OF BREATH  *UNUSUAL BRUISING OR BLEEDING  TENDERNESS IN MOUTH AND THROAT WITH OR WITHOUT PRESENCE OF ULCERS  *URINARY PROBLEMS  *BOWEL PROBLEMS  UNUSUAL RASH Items with * indicate a potential emergency and should be followed up as soon as possible.  Feel free to call the clinic should you have any questions or concerns. The clinic phone number is [phone].  Please show the CHEMO ALERT CARD at check-in to the Emergency Department and triage nurse.  Coronavirus (COVID-19) Are you at risk?  Are you at risk for the Coronavirus (COVID-19)?  To be considered HIGH RISK for Coronavirus (COVID-19), you have to meet the following criteria:  . Traveled to [place], [place], [place], [place] or [place]; or in the [place] to [place], [place], [place], or [place]; and have fever, cough, and shortness of breath within the last 2 weeks of travel OR . Been in close contact with a person diagnosed with COVID-19 within the last 2 weeks and have fever, cough, and shortness of breath . IF YOU DO NOT MEET THESE CRITERIA, YOU ARE CONSIDERED LOW RISK FOR COVID-19.  What to do if you are HIGH RISK for COVID-19?  . If you are having a medical emergency, call 911. . Seek medical care right away. Before you go to a doctor's office, urgent care or emergency department, call ahead and tell them about  your recent travel, contact with someone diagnosed with COVID-19, and your symptoms. You should receive instructions from your physician's office regarding next steps of care.  . When you arrive at healthcare provider, tell the healthcare staff immediately you have returned from visiting [place], [place], [place], [place] or [place]; or traveled in the [place] to [place], [place], [place], or [place]; in the last two weeks or you have been in close contact with a person diagnosed with COVID-19 in the last 2 weeks.   . Tell the health care staff about your symptoms: fever, cough and shortness of breath. . After you have been seen by a medical provider, you will be either: o Tested for (COVID-19) and discharged home on quarantine except to seek medical care if symptoms worsen, and asked to  - Stay home and avoid contact with others until you get your results (4-5 days)  - Avoid travel on public transportation if possible (such as bus, train, or airplane) or o Sent to the Emergency Department by EMS for evaluation, COVID-19 testing, and possible admission depending on your condition and test results.  What to do if you are LOW RISK for COVID-19?  Reduce your risk of any infection by using the same precautions used for avoiding the common cold or flu:  . Wash your hands often with soap and warm water for at least 20 seconds.  If soap and water are not readily available, use an alcohol-based hand sanitizer with at least 60% alcohol.  . If   coughing or sneezing, cover your mouth and nose by coughing or sneezing into the elbow areas of your shirt or coat, into a tissue or into your sleeve (not your hands). . Avoid shaking hands with others and consider head nods or verbal greetings only. . Avoid touching your eyes, nose, or mouth with unwashed hands.  . Avoid close contact with people who are sick. . Avoid places or events with large numbers of people in one location, like concerts or sporting  events. . Carefully consider travel plans you have or are making. . If you are planning any travel outside or inside the [place], visit the CDC's Travelers' Health webpage for the latest health notices. . If you have some symptoms but not all symptoms, continue to monitor at home and seek medical attention if your symptoms worsen. . If you are having a medical emergency, call 911.   ADDITIONAL HEALTHCARE OPTIONS FOR PATIENTS  [facility] Telehealth / e-Visit: [email or web address]         [facility]: [phone]  [facility]: [phone]                   [facility]: [phone]    

## 2019-05-31 ENCOUNTER — Telehealth: Payer: Self-pay | Admitting: *Deleted

## 2019-05-31 NOTE — Telephone Encounter (Signed)
/  Received call from patient concerning her liver  u/s. She states she has left several messages and has not heard anything about her results.  I discussed with her that the fatty liver could be causing her elevated liver function.  I encouraged her to also talk with her cardiologist regarding the medications she is taking.  Instructed her to continue exercise and we would recheck them at her 9/1 appointment. Message also sent to Dr, Lindi Adie for any other advice. Patient verbalized understanding.

## 2019-06-11 NOTE — Assessment & Plan Note (Signed)
08/17/2018:Screening mammogram detected abnormality in the left breast by ultrasound irregular mass 10:00 position 1.3 cm, 10 o'clock position no enlarged lymph nodes, ER 0%, PR 0%, Ki-67 80%, HER-2 +3+ by IHC, grade 3, lymphovascular invasion present, T1c N0 stage Ia clinical stage  12/05/2018:Left lumpectomy: No residual invasive carcinoma, margins negative, 0/2 lymph nodes negative, complete pathologic response [id number] Adjuvant radiation therapy: 01/16/2019-02/11/2019  Treatment plan: Herceptin maintenance to complete 1 year of therapy to be completed October 2020 Last echo 03/14/19 with Dr. Haroldine Laws: EF 55-60%  Elevated LFTs: Ultrasound revealed fatty liver.  I instructed her to exercise regularly and lose weight. Return to clinic every 3 weeks for Herceptin every 6 weeks for follow-up withme with labs.

## 2019-06-17 ENCOUNTER — Other Ambulatory Visit: Payer: Self-pay

## 2019-06-17 DIAGNOSIS — C50212 Malignant neoplasm of upper-inner quadrant of left female breast: Secondary | ICD-10-CM

## 2019-06-17 NOTE — Progress Notes (Signed)
Patient Care Team: [name], MD as PCP - General (Internal Medicine) Rolm Bookbinder, MD as Consulting Physician (General Surgery)  DIAGNOSIS:    ICD-10-CM   1. Malignant neoplasm of upper-inner quadrant of left breast in female, estrogen receptor negative ([place])  C50.212    Z17.1     SUMMARY OF ONCOLOGIC HISTORY: Oncology History  Malignant neoplasm of upper-inner quadrant of left breast in female, estrogen receptor negative ([place])  08/17/2018 Initial Diagnosis   Screening mammogram detected abnormality in the left breast by ultrasound irregular mass 10:00 position 1.3 cm, 10 o'clock position no enlarged lymph nodes, ER 0%, PR 0%, Ki-67 80%, HER-2 +3+ by [facility]   08/28/2018 Cancer Staging   Staging form: Breast, AJCC 8th Edition - Clinical stage from 08/28/2018: Stage IA (cT1c, cN0, cM0, G3, ER-, PR-, HER2+) - Signed by Nicholas Lose, MD on 08/28/2018   08/28/2018 Breast MRI   malignancy within the UPPER INNER LEFT breast with dominant mass measuring 2.1 cm. With small adjacent satellite nodules, the entire area 1.5 x 3 x 2.2 cm.No evidence of abnormal lymph nodes or RIGHT breast malignancy.   09/03/2018 -  Chemotherapy   Weekly Taxol and Herceptin followed by maintenance Herceptin for one year.    12/05/2018 Surgery   Left lumpectomy: No residual invasive carcinoma, margins negative, 0/2 lymph nodes negative, complete pathologic response [id number]     CHIEF COMPLIANT: Herceptin maintenance  INTERVAL HISTORY: Andrea Clarke is a 73 y.o. with above-mentioned history of left breast cancer who underwent neoadjuvant chemotherapy, followed by lumpectomy, radiation, and is currently on maintenance Herceptin. Abdominal US following elevated LFTs on 05/14/19 showed probably fatty infiltration of the liver and was otherwise negative. She presents to the clinic today for treatment.   REVIEW OF SYSTEMS:   Constitutional: Denies fevers, chills or abnormal weight loss Eyes:  Denies blurriness of vision Ears, nose, mouth, throat, and face: Denies mucositis or sore throat Respiratory: Denies cough, dyspnea or wheezes Cardiovascular: Denies palpitation, chest discomfort Gastrointestinal: Denies nausea, heartburn or change in bowel habits Skin: Denies abnormal skin rashes Lymphatics: Denies new lymphadenopathy or easy bruising Neurological: Denies numbness, tingling or new weaknesses Behavioral/Psych: Mood is stable, no new changes  Extremities: No lower extremity edema Breast: denies any pain or lumps or nodules in either breasts All other systems were reviewed with the patient and are negative.  I have reviewed the past medical history, past surgical history, social history and family history with the patient and they are unchanged from previous note.  ALLERGIES:  is allergic to adhesive [tape]; oxycodone hcl; augmentin [amoxicillin-pot clavulanate]; and xylocaine [lidocaine hcl].  MEDICATIONS:  Current Outpatient Medications  Medication Sig Dispense Refill  . acetaminophen (TYLENOL) 500 MG tablet Take 1,000 mg by mouth daily as needed for moderate pain or headache.     Marland Kitchen atorvastatin (LIPITOR) 20 MG tablet Take 20 mg by mouth daily.    . brimonidine-timolol (COMBIGAN) 0.2-0.5 % ophthalmic solution Place 1 drop into both eyes every 12 (twelve) hours.    Marland Kitchen CARTIA XT 120 MG 24 hr capsule TAKE ONE CAPSULE BY MOUTH DAILY, STOPPING METOPROLOL 90 capsule 3  . cetirizine (ZYRTEC) 10 MG tablet Take 10 mg by mouth daily.    . Cholecalciferol (VITAMIN D3) 125 MCG (5000 UT) CAPS Take 5,000 Units by mouth at bedtime.     . Coenzyme Q10 10 MG capsule Take 10 mg by mouth at bedtime.    Marland Kitchen dexlansoprazole (DEXILANT) 60 MG capsule Take 60 mg by  mouth daily.    . flecainide (TAMBOCOR) 100 MG tablet TAKE 1 TABLET BY MOUTH TWICE DAILY 180 tablet 0  . fluticasone (FLONASE) 50 MCG/ACT nasal spray Place 1 spray into both nostrils daily as needed for allergies.     . furosemide  (LASIX) 20 MG tablet TAKE 1 TABLET(20 MG) BY MOUTH DAILY 30 tablet 0  . Krill Oil 500 MG CAPS Take 500 mg by mouth at bedtime.    . lidocaine-prilocaine (EMLA) cream Apply to affected area once 30 g 3  . LORazepam (ATIVAN) 0.5 MG tablet Take 1 tablet (0.5 mg total) by mouth at bedtime as needed (Nausea or vomiting). (Patient not taking: Reported on 12/20/2018) 30 tablet 0  . montelukast (SINGULAIR) 10 MG tablet Take 10 mg by mouth daily.    . Multiple Vitamin (MULTIVITAMIN) tablet Take 1 tablet by mouth at bedtime.     . ondansetron (ZOFRAN) 8 MG tablet Take 1 tablet (8 mg total) by mouth 2 (two) times daily as needed (Nausea or vomiting). (Patient not taking: Reported on 12/20/2018) 30 tablet 1  . Polyethyl Glycol-Propyl Glycol (SYSTANE OP) Place 1 drop into both eyes 2 (two) times daily as needed (dry eyes).    . prochlorperazine (COMPAZINE) 10 MG tablet TAKE 1 TABLET(10 MG) BY MOUTH EVERY 6 HOURS AS NEEDED FOR NAUSEA OR VOMITING (Patient not taking: No sig reported) 385 tablet 1  . quinapril (ACCUPRIL) 20 MG tablet Take 1 tablet (20 mg total) by mouth daily. Please schedule appt for future refills. 2nd attempt 30 tablet 0  . traMADol (ULTRAM) 50 MG tablet Take 2 tablets (100 mg total) by mouth every 6 (six) hours as needed. (Patient not taking: Reported on 12/20/2018) 12 tablet 0   No current facility-administered medications for this visit.    Facility-Administered Medications Ordered in Other Visits  Medication Dose Route Frequency Provider Last Rate Last Dose  . sodium chloride flush (NS) 0.9 % injection 10 mL  10 mL Intracatheter Once Nicholas Lose, MD        PHYSICAL EXAMINATION: ECOG PERFORMANCE STATUS: 0 - Asymptomatic  There were no vitals filed for this visit. There were no vitals filed for this visit.  GENERAL: alert, no distress and comfortable SKIN: skin color, texture, turgor are normal, no rashes or significant lesions EYES: normal, Conjunctiva are pink and non-injected, sclera  clear OROPHARYNX: no exudate, no erythema and lips, buccal mucosa, and tongue normal  NECK: supple, thyroid normal size, non-tender, without nodularity LYMPH: no palpable lymphadenopathy in the cervical, axillary or inguinal LUNGS: clear to auscultation and percussion with normal breathing effort HEART: regular rate & rhythm and no murmurs and no lower extremity edema ABDOMEN: abdomen soft, non-tender and normal bowel sounds MUSCULOSKELETAL: no cyanosis of digits and no clubbing  NEURO: alert & oriented x 3 with fluent speech, no focal motor/sensory deficits EXTREMITIES: No lower extremity edema  LABORATORY DATA:  I have reviewed the data as listed CMP Latest Ref Rng & Units 05/07/2019 04/16/2019 03/26/2019  Glucose 70 - 99 mg/dL 143(H) 142(H) 144(H)  BUN 8 - 23 mg/dL _0 Creatinine 0.44 - 1.00 mg/dL 0.75 0.73 0.72  Sodium 135 - 145 mmol/L 141 139 139  Potassium 3.5 - 5.1 mmol/L 4.6 4.3 4.5  Chloride 98 - 111 mmol/L 105 104 106  CO2 22 - 32 mmol/L _1 Calcium 8.9 - 10.3 mg/dL 9.2 8.9 8.8(L)  Total Protein 6.5 - 8.1 g/dL 6.6 6.4(L) 6.3(L)  Total Bilirubin 0.3 -  1.2 mg/dL 0.7 0.7 0.7  Alkaline Phos 38 - 126 U/L 101 96 84  AST 15 - 41 U/L 129(H) 87(H) 75(H)  ALT 0 - 44 U/L 164(H) 121(H) 109(H)    Lab Results  Component Value Date   WBC 5.1 05/07/2019   HGB 14.2 05/07/2019   HCT 40.5 05/07/2019   MCV 99.3 05/07/2019   PLT 174 05/07/2019   NEUTROABS 3.2 05/07/2019    ASSESSMENT & PLAN:  Malignant neoplasm of upper-inner quadrant of left breast in female, estrogen receptor negative ([place]) 08/17/2018:Screening mammogram detected abnormality in the left breast by ultrasound irregular mass 10:00 position 1.3 cm, 10 o'clock position no enlarged lymph nodes, ER 0%, PR 0%, Ki-67 80%, HER-2 +3+ by IHC, grade 3, lymphovascular invasion present, T1c N0 stage Ia clinical stage  12/05/2018:Left lumpectomy: No residual invasive carcinoma, margins negative, 0/2 lymph nodes negative,  complete pathologic response [id number] Adjuvant radiation therapy: 01/16/2019-02/11/2019  Treatment plan: Herceptin maintenance to complete 1 year of therapy to be completed October 2020 Last echo 03/14/19 with Dr. Haroldine Laws: EF 55-60% We will repeat an echocardiogram before the next visit.  Elevated LFTs: Ultrasound revealed fatty liver.  I instructed her to exercise regularly and lose weight. Return to clinic every 3 weeks for Herceptin every 6 weeks for follow-up withme with labs.    No orders of the defined types were placed in this encounter.  The patient has a good understanding of the overall plan. she agrees with it. she will call with any problems that may develop before the next visit here.  Nicholas Lose, MD 06/18/2019  Julious Oka Dorshimer am acting as scribe for Dr. Nicholas Lose.  I have reviewed the above documentation for accuracy and completeness, and I agree with the above.

## 2019-06-18 ENCOUNTER — Inpatient Hospital Stay (HOSPITAL_BASED_OUTPATIENT_CLINIC_OR_DEPARTMENT_OTHER): Payer: PRIVATE HEALTH INSURANCE | Admitting: Hematology and Oncology

## 2019-06-18 ENCOUNTER — Inpatient Hospital Stay: Payer: PRIVATE HEALTH INSURANCE

## 2019-06-18 ENCOUNTER — Inpatient Hospital Stay: Payer: PRIVATE HEALTH INSURANCE | Attending: Hematology and Oncology

## 2019-06-18 ENCOUNTER — Other Ambulatory Visit: Payer: Self-pay

## 2019-06-18 DIAGNOSIS — C50212 Malignant neoplasm of upper-inner quadrant of left female breast: Secondary | ICD-10-CM

## 2019-06-18 DIAGNOSIS — Z5112 Encounter for antineoplastic immunotherapy: Secondary | ICD-10-CM | POA: Insufficient documentation

## 2019-06-18 DIAGNOSIS — Z171 Estrogen receptor negative status [ER-]: Secondary | ICD-10-CM

## 2019-06-18 DIAGNOSIS — Z95828 Presence of other vascular implants and grafts: Secondary | ICD-10-CM

## 2019-06-18 LAB — CMP (CANCER CENTER ONLY)
ALT: 128 U/L — ABNORMAL HIGH (ref 0–44)
AST: 94 U/L — ABNORMAL HIGH (ref 15–41)
Albumin: 3.9 g/dL (ref 3.5–5.0)
Alkaline Phosphatase: 92 U/L (ref 38–126)
Anion gap: 10 (ref 5–15)
BUN: 17 mg/dL (ref 8–23)
CO2: 25 mmol/L (ref 22–32)
Calcium: 9.1 mg/dL (ref 8.9–10.3)
Chloride: 104 mmol/L (ref 98–111)
Creatinine: 0.75 mg/dL (ref 0.44–1.00)
GFR, Est AFR Am: >60 mL/min (ref >60)
GFR, Estimated: >60 mL/min (ref >60)
Glucose, Bld: 133 mg/dL — ABNORMAL HIGH (ref 70–99)
Potassium: 4.2 mmol/L (ref 3.5–5.1)
Sodium: 139 mmol/L (ref 135–145)
Total Bilirubin: 0.8 mg/dL (ref 0.3–1.2)
Total Protein: 6.6 g/dL (ref 6.5–8.1)

## 2019-06-18 LAB — CBC WITH DIFFERENTIAL (CANCER CENTER ONLY)
Abs Immature Granulocytes: 0.01 10*3/uL (ref 0.00–0.07)
Basophils Absolute: 0 10*3/uL (ref 0.0–0.1)
Basophils Relative: 0 %
Eosinophils Absolute: 0.2 10*3/uL (ref 0.0–0.5)
Eosinophils Relative: 5 %
HCT: 41 % (ref 36.0–46.0)
Hemoglobin: 14.7 g/dL (ref 12.0–15.0)
Immature Granulocytes: 0 %
Lymphocytes Relative: 24 %
Lymphs Abs: 1.2 10*3/uL (ref 0.7–4.0)
MCH: 35 pg — ABNORMAL HIGH (ref 26.0–34.0)
MCHC: 35.9 g/dL (ref 30.0–36.0)
MCV: 97.6 fL (ref 80.0–100.0)
Monocytes Absolute: 0.5 10*3/uL (ref 0.1–1.0)
Monocytes Relative: 9 %
Neutro Abs: 3.2 10*3/uL (ref 1.7–7.7)
Neutrophils Relative %: 62 %
Platelet Count: 148 10*3/uL — ABNORMAL LOW (ref 150–400)
RBC: 4.2 MIL/uL (ref 3.87–5.11)
RDW: 11.4 % — ABNORMAL LOW (ref 11.5–15.5)
WBC Count: 5.2 10*3/uL (ref 4.0–10.5)
nRBC: 0 % (ref 0.0–0.2)

## 2019-06-18 MED ORDER — DIPHENHYDRAMINE HCL 50 MG/ML IJ SOLN
INTRAMUSCULAR | Status: AC
  Filled 2019-06-18: qty 1

## 2019-06-18 MED ORDER — SODIUM CHLORIDE 0.9 % IV SOLN
Freq: Once | INTRAVENOUS | Status: AC
  Administered 2019-06-18: 09:00:00 via INTRAVENOUS
  Filled 2019-06-18: qty 250

## 2019-06-18 MED ORDER — DIPHENHYDRAMINE HCL 50 MG/ML IJ SOLN
25.0000 mg | Freq: Once | INTRAMUSCULAR | Status: AC
  Administered 2019-06-18: 10:00:00 25 mg via INTRAVENOUS

## 2019-06-18 MED ORDER — ACETAMINOPHEN 325 MG PO TABS
650.0000 mg | ORAL_TABLET | Freq: Once | ORAL | Status: AC
  Administered 2019-06-18: 10:00:00 650 mg via ORAL

## 2019-06-18 MED ORDER — HEPARIN SOD (PORK) LOCK FLUSH 100 UNIT/ML IV SOLN
500.0000 [IU] | Freq: Once | INTRAVENOUS | Status: AC | PRN
  Administered 2019-06-18: 11:00:00 500 [IU]
  Filled 2019-06-18: qty 5

## 2019-06-18 MED ORDER — ACETAMINOPHEN 325 MG PO TABS
ORAL_TABLET | ORAL | Status: AC
  Filled 2019-06-18: qty 2

## 2019-06-18 MED ORDER — SODIUM CHLORIDE 0.9% FLUSH
10.0000 mL | INTRAVENOUS | Status: DC | PRN
  Administered 2019-06-18: 10 mL
  Filled 2019-06-18: qty 10

## 2019-06-18 MED ORDER — SODIUM CHLORIDE 0.9% FLUSH
10.0000 mL | Freq: Once | INTRAVENOUS | Status: AC
  Administered 2019-06-18: 09:00:00 10 mL
  Filled 2019-06-18: qty 10

## 2019-06-18 MED ORDER — TRASTUZUMAB-DKST CHEMO 150 MG IV SOLR
450.0000 mg | Freq: Once | INTRAVENOUS | Status: AC
  Administered 2019-06-18: 11:00:00 450 mg via INTRAVENOUS
  Filled 2019-06-18: qty 21.43

## 2019-06-18 NOTE — Patient Instructions (Signed)
Coronavirus (COVID-19) Are you at risk?  Are you at risk for the Coronavirus (COVID-19)?  To be considered HIGH RISK for Coronavirus (COVID-19), you have to meet the following criteria:  . Traveled to [place], [place], [place], [place] or [place]; or in the [place] to [place], [place], [place], or [place]; and have fever, cough, and shortness of breath within the last 2 weeks of travel OR . Been in close contact with a person diagnosed with COVID-19 within the last 2 weeks and have fever, cough, and shortness of breath . IF YOU DO NOT MEET THESE CRITERIA, YOU ARE CONSIDERED LOW RISK FOR COVID-19.  What to do if you are HIGH RISK for COVID-19?  . If you are having a medical emergency, call 911. . Seek medical care right away. Before you go to a doctor's office, urgent care or emergency department, call ahead and tell them about your recent travel, contact with someone diagnosed with COVID-19, and your symptoms. You should receive instructions from your physician's office regarding next steps of care.  . When you arrive at healthcare provider, tell the healthcare staff immediately you have returned from visiting [place], [place], [place], [place] or [place]; or traveled in the [place] to [place], [place], [place], or [place]; in the last two weeks or you have been in close contact with a person diagnosed with COVID-19 in the last 2 weeks.   . Tell the health care staff about your symptoms: fever, cough and shortness of breath. . After you have been seen by a medical provider, you will be either: o Tested for (COVID-19) and discharged home on quarantine except to seek medical care if symptoms worsen, and asked to  - Stay home and avoid contact with others until you get your results (4-5 days)  - Avoid travel on public transportation if possible (such as bus, train, or airplane) or o Sent to the Emergency Department by EMS for evaluation, COVID-19 testing, and possible  admission depending on your condition and test results.  What to do if you are LOW RISK for COVID-19?  Reduce your risk of any infection by using the same precautions used for avoiding the common cold or flu:  . Wash your hands often with soap and warm water for at least 20 seconds.  If soap and water are not readily available, use an alcohol-based hand sanitizer with at least 60% alcohol.  . If coughing or sneezing, cover your mouth and nose by coughing or sneezing into the elbow areas of your shirt or coat, into a tissue or into your sleeve (not your hands). . Avoid shaking hands with others and consider head nods or verbal greetings only. . Avoid touching your eyes, nose, or mouth with unwashed hands.  . Avoid close contact with people who are sick. . Avoid places or events with large numbers of people in one location, like concerts or sporting events. . Carefully consider travel plans you have or are making. . If you are planning any travel outside or inside the [place], visit the CDC's Travelers' Health webpage for the latest health notices. . If you have some symptoms but not all symptoms, continue to monitor at home and seek medical attention if your symptoms worsen. . If you are having a medical emergency, call 911.   ADDITIONAL HEALTHCARE OPTIONS FOR PATIENTS  [facility] / e-Visit: [email or web address]         [facility]: [phone]  Onset   Urgent Care: [facility]: [place] Discharge Instructions for Patients Receiving Chemotherapy  Today you received the following chemotherapy agents Trastuzumab  To help prevent nausea and vomiting after your treatment, we encourage you to take your nausea medication as directed.   If you develop nausea and vomiting that is not controlled by your nausea medication, call the clinic.   BELOW ARE  SYMPTOMS THAT SHOULD BE REPORTED IMMEDIATELY:  *FEVER GREATER THAN 100.5 F  *CHILLS WITH OR WITHOUT FEVER  NAUSEA AND VOMITING THAT IS NOT CONTROLLED WITH YOUR NAUSEA MEDICATION  *UNUSUAL SHORTNESS OF BREATH  *UNUSUAL BRUISING OR BLEEDING  TENDERNESS IN MOUTH AND THROAT WITH OR WITHOUT PRESENCE OF ULCERS  *URINARY PROBLEMS  *BOWEL PROBLEMS  UNUSUAL RASH Items with * indicate a potential emergency and should be followed up as soon as possible.  Feel free to call the clinic should you have any questions or concerns. The clinic phone number is [phone].  Please show the [place] at check-in to the Emergency Department and triage nurse.

## 2019-06-21 ENCOUNTER — Ambulatory Visit (HOSPITAL_COMMUNITY)
Admission: RE | Admit: 2019-06-21 | Discharge: 2019-06-21 | Disposition: A | Discharge status: Home / Self Care | Payer: PRIVATE HEALTH INSURANCE | Source: Ambulatory Visit | Attending: Hematology and Oncology | Admitting: Hematology and Oncology

## 2019-06-21 ENCOUNTER — Other Ambulatory Visit: Payer: Self-pay

## 2019-06-21 DIAGNOSIS — I10 Essential (primary) hypertension: Secondary | ICD-10-CM | POA: Diagnosis not present

## 2019-06-21 DIAGNOSIS — C50212 Malignant neoplasm of upper-inner quadrant of left female breast: Secondary | ICD-10-CM | POA: Diagnosis not present

## 2019-06-21 DIAGNOSIS — I340 Nonrheumatic mitral (valve) insufficiency: Secondary | ICD-10-CM | POA: Insufficient documentation

## 2019-06-21 DIAGNOSIS — I371 Nonrheumatic pulmonary valve insufficiency: Secondary | ICD-10-CM | POA: Insufficient documentation

## 2019-06-21 DIAGNOSIS — Z87891 Personal history of nicotine dependence: Secondary | ICD-10-CM | POA: Diagnosis not present

## 2019-06-21 DIAGNOSIS — E785 Hyperlipidemia, unspecified: Secondary | ICD-10-CM | POA: Insufficient documentation

## 2019-06-21 DIAGNOSIS — Z171 Estrogen receptor negative status [ER-]: Secondary | ICD-10-CM

## 2019-06-21 NOTE — Progress Notes (Signed)
  Echocardiogram 2D Echocardiogram has been performed.  Burnett Kanaris 06/21/2019, 11:32 AM

## 2019-06-23 ENCOUNTER — Other Ambulatory Visit: Payer: Self-pay | Admitting: Cardiovascular Disease

## 2019-06-25 ENCOUNTER — Other Ambulatory Visit: Payer: Self-pay | Admitting: Cardiovascular Disease

## 2019-06-25 MED ORDER — QUINAPRIL HCL 20 MG PO TABS: 20.0000 mg | ORAL_TABLET | Freq: Every day | ORAL | 0 refills | Status: DC

## 2019-07-08 ENCOUNTER — Other Ambulatory Visit: Payer: Self-pay | Admitting: Cardiology

## 2019-07-09 ENCOUNTER — Inpatient Hospital Stay: Payer: PRIVATE HEALTH INSURANCE

## 2019-07-09 ENCOUNTER — Other Ambulatory Visit: Payer: Self-pay

## 2019-07-09 ENCOUNTER — Other Ambulatory Visit: Payer: Self-pay | Admitting: Hematology and Oncology

## 2019-07-09 VITALS — BP 135/71 | HR 63 | Temp 98.4°F | Resp 16

## 2019-07-09 DIAGNOSIS — C50212 Malignant neoplasm of upper-inner quadrant of left female breast: Secondary | ICD-10-CM

## 2019-07-09 DIAGNOSIS — Z5112 Encounter for antineoplastic immunotherapy: Secondary | ICD-10-CM | POA: Diagnosis not present

## 2019-07-09 DIAGNOSIS — Z171 Estrogen receptor negative status [ER-]: Secondary | ICD-10-CM

## 2019-07-09 MED ORDER — TRASTUZUMAB-DKST CHEMO 150 MG IV SOLR
450.0000 mg | Freq: Once | INTRAVENOUS | Status: AC
  Administered 2019-07-09: 450 mg via INTRAVENOUS
  Filled 2019-07-09: qty 21.43

## 2019-07-09 MED ORDER — DIPHENHYDRAMINE HCL 50 MG/ML IJ SOLN
25.0000 mg | Freq: Once | INTRAMUSCULAR | Status: AC
  Administered 2019-07-09: 25 mg via INTRAVENOUS

## 2019-07-09 MED ORDER — ACETAMINOPHEN 325 MG PO TABS
ORAL_TABLET | ORAL | Status: AC
  Filled 2019-07-09: qty 2

## 2019-07-09 MED ORDER — HEPARIN SOD (PORK) LOCK FLUSH 100 UNIT/ML IV SOLN
500.0000 [IU] | Freq: Once | INTRAVENOUS | Status: AC | PRN
  Administered 2019-07-09: 500 [IU]
  Filled 2019-07-09: qty 5

## 2019-07-09 MED ORDER — ACETAMINOPHEN 325 MG PO TABS
650.0000 mg | ORAL_TABLET | Freq: Once | ORAL | Status: AC
  Administered 2019-07-09: 650 mg via ORAL

## 2019-07-09 MED ORDER — DIPHENHYDRAMINE HCL 50 MG/ML IJ SOLN
INTRAMUSCULAR | Status: AC
  Filled 2019-07-09: qty 1

## 2019-07-09 MED ORDER — SODIUM CHLORIDE 0.9% FLUSH
10.0000 mL | INTRAVENOUS | Status: DC | PRN
  Administered 2019-07-09: 10 mL
  Filled 2019-07-09: qty 10

## 2019-07-09 MED ORDER — SODIUM CHLORIDE 0.9 % IV SOLN
Freq: Once | INTRAVENOUS | Status: AC
  Administered 2019-07-09: 10:00:00 via INTRAVENOUS
  Filled 2019-07-09: qty 250

## 2019-07-09 NOTE — Patient Instructions (Signed)
[facility] Discharge Instructions for Patients Receiving Chemotherapy  Today you received the following chemotherapy agents: trastuzumab  To help prevent nausea and vomiting after your treatment, we encourage you to take your nausea medication  as prescribed.    If you develop nausea and vomiting that is not controlled by your nausea medication, call the clinic.   BELOW ARE SYMPTOMS THAT SHOULD BE REPORTED IMMEDIATELY:  *FEVER GREATER THAN 100.5 F  *CHILLS WITH OR WITHOUT FEVER  NAUSEA AND VOMITING THAT IS NOT CONTROLLED WITH YOUR NAUSEA MEDICATION  *UNUSUAL SHORTNESS OF BREATH  *UNUSUAL BRUISING OR BLEEDING  TENDERNESS IN MOUTH AND THROAT WITH OR WITHOUT PRESENCE OF ULCERS  *URINARY PROBLEMS  *BOWEL PROBLEMS  UNUSUAL RASH Items with * indicate a potential emergency and should be followed up as soon as possible.  Feel free to call the clinic should you have any questions or concerns. The clinic phone number is [phone].  Please show the CHEMO ALERT CARD at check-in to the Emergency Department and triage nurse.     

## 2019-07-18 IMAGING — US ULTRASOUND GUIDED BREAST CYST ASPIRATION
1 series · 5 of 5 positions shown · non-contrast
Comparison: Left breast ultrasound January 07, 2019

CLINICAL DATA: Recurrent palpable postoperative seroma axillary
tail left breast. The patient states the seroma is bothersome to
her, especially when her arm touches against it.

EXAM:
ULTRASOUND GUIDED LEFT BREAST SEROMA ASPIRATION

[Series 1: ultrasound guided breast cyst aspiration · 0.07mm/px · 5 of 5 slices shown]
[im 1/5]
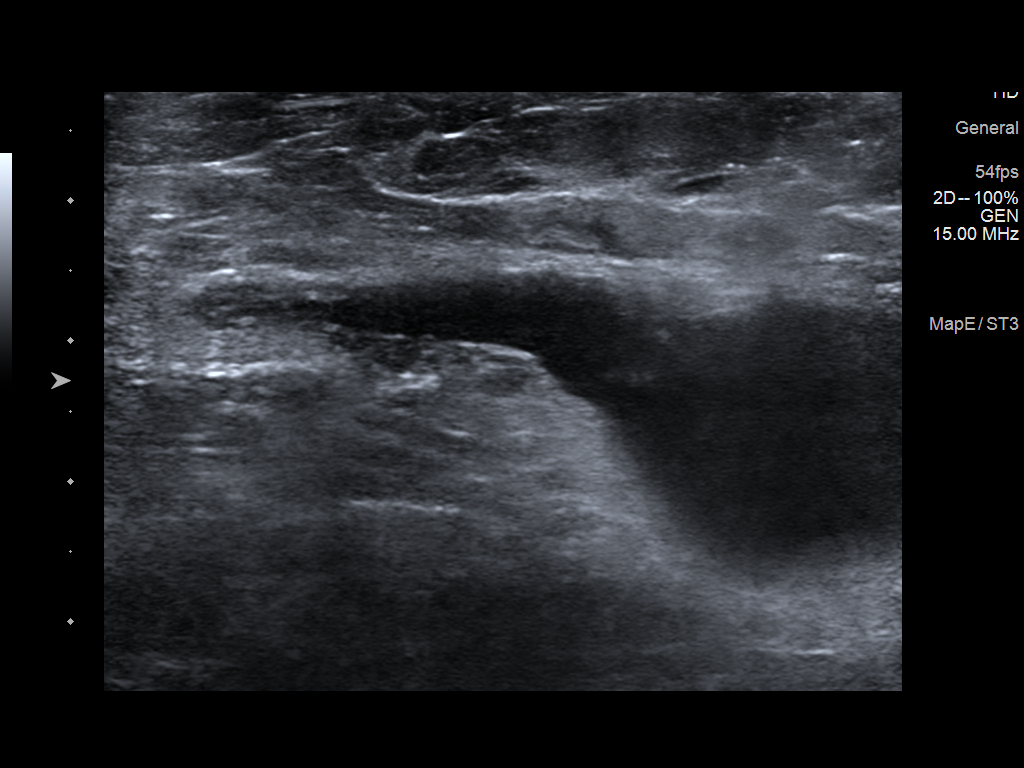
[im 2/5]
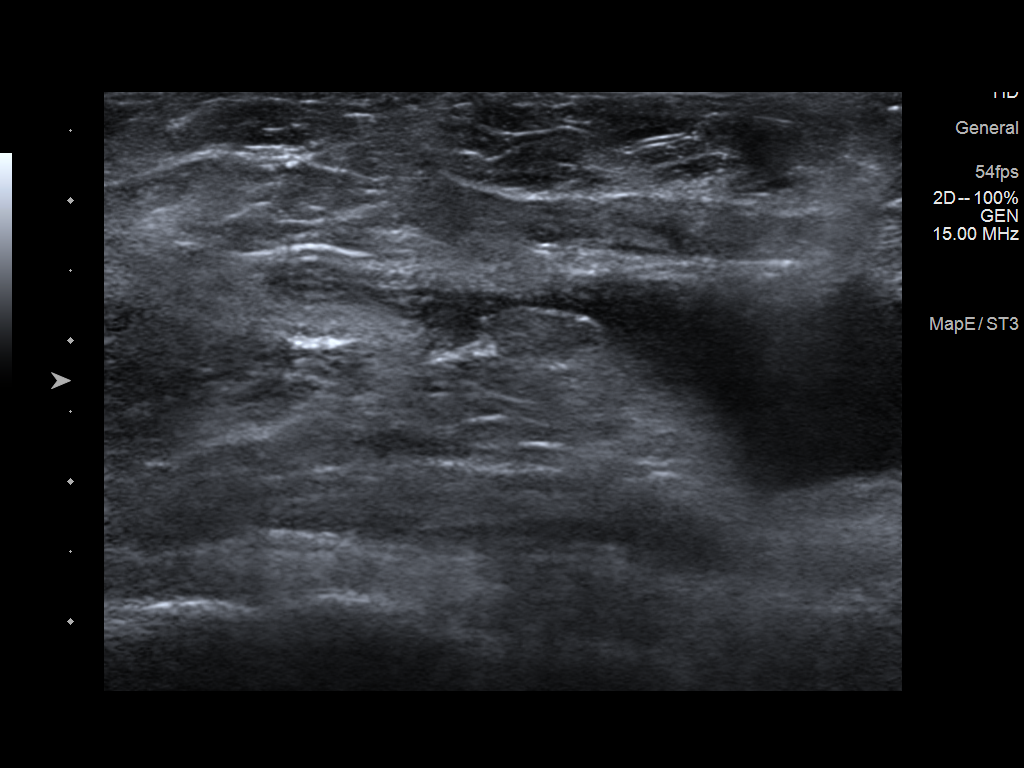
[im 3/5]
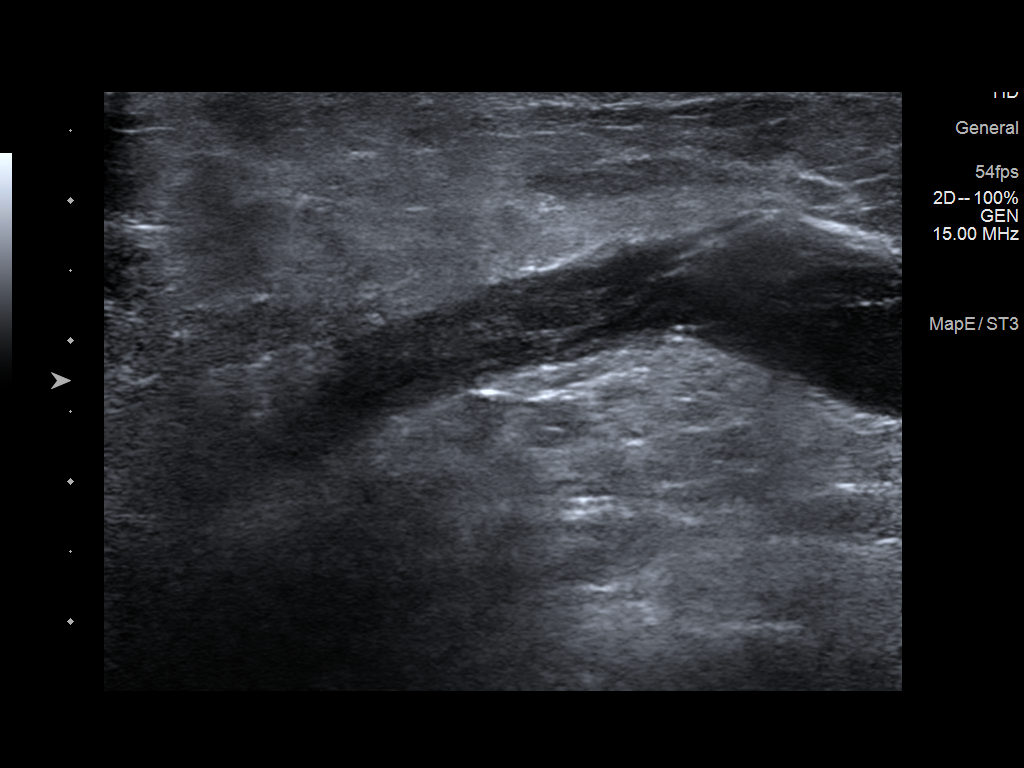
[im 4/5]
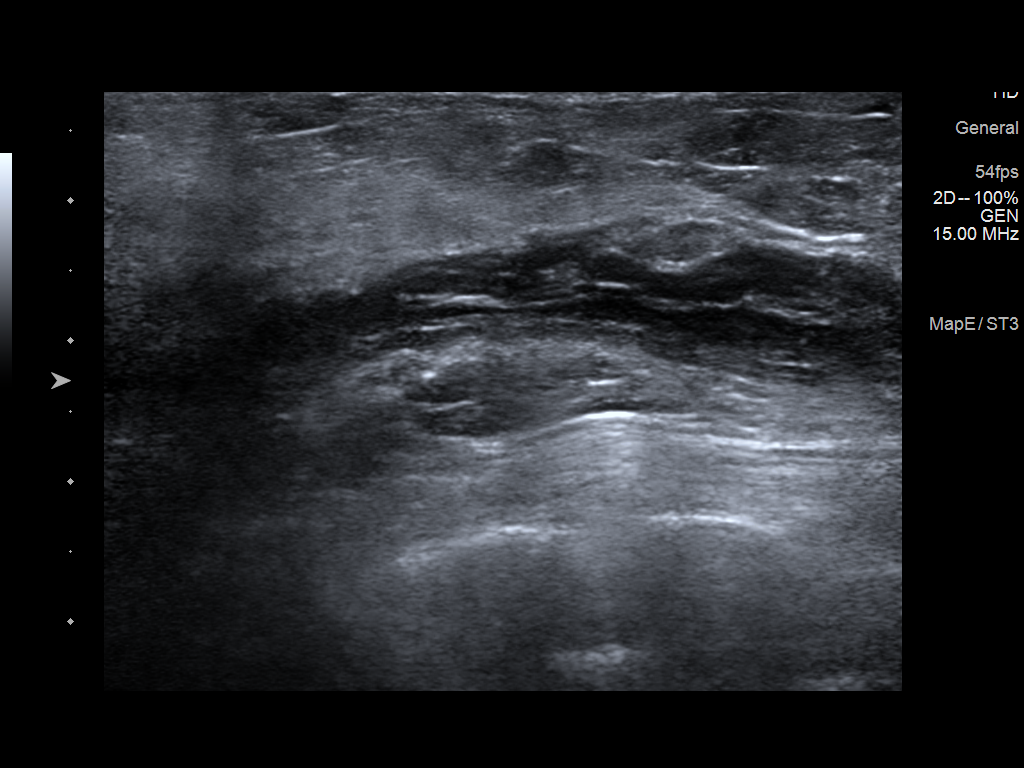
[im 5/5]
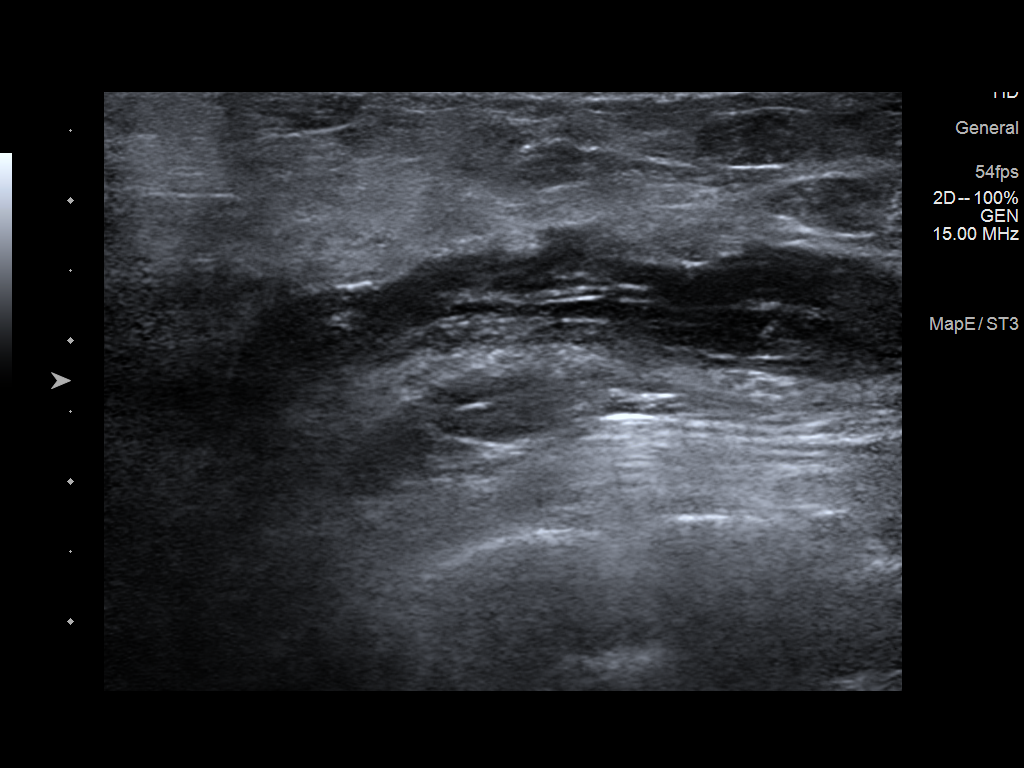

[5 of 5 positions shown; findings below may reference images not displayed]

PROCEDURE:
Using sterile technique, 1% lidocaine, under direct ultrasound
visualization, needle aspiration of a seroma was performed.
Approximately 85-90 cc of clear yellowish fluid was aspirated. The
seroma significantly decreased in size, with the residual elongated
crescentic mildly complicated fluid collection remaining. This is
nonpalpable.
IMPRESSION: Ultrasound-guided aspiration of postoperative seroma no apparent
complications. The fluid was discarded.

RECOMMENDATIONS:
Annual bilateral diagnostic mammogram is recommended in [DATE]

## 2019-07-18 IMAGING — US ULTRASOUND LEFT BREAST LIMITED
1 series · 6 of 6 positions shown · non-contrast
Comparison: Previous exam(s).

CLINICAL DATA: 72-year-old patient was diagnosed with left breast
cancer in 7534. Following lumpectomy, she reports a history of a
palpable postoperative seroma in the axillary tail of the left
breast at the site of the sentinel lymph node biopsy. She says that
the seroma has been aspirated in the surgical office. The palpable
seroma has recurred. Dr. Kristine requests aspiration.

EXAM:
ULTRASOUND OF THE LEFT BREAST

[Series 1: ultrasound left breast limited · 0.08mm/px · 6 of 6 slices shown]
[im 1/6]
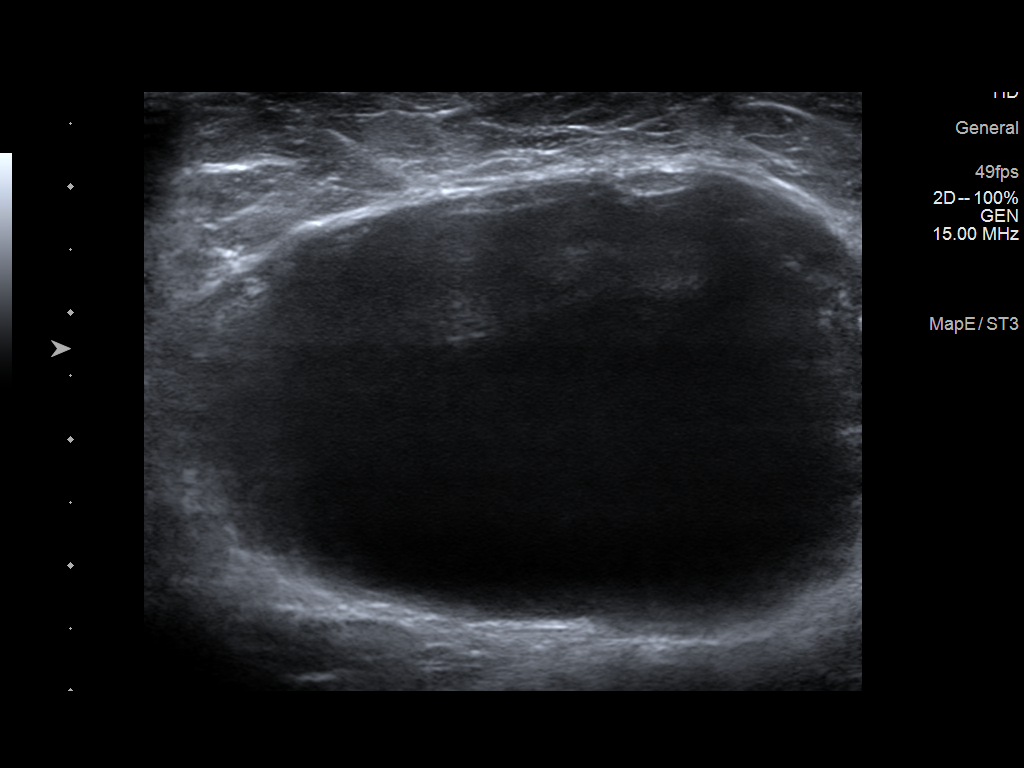
[im 2/6]
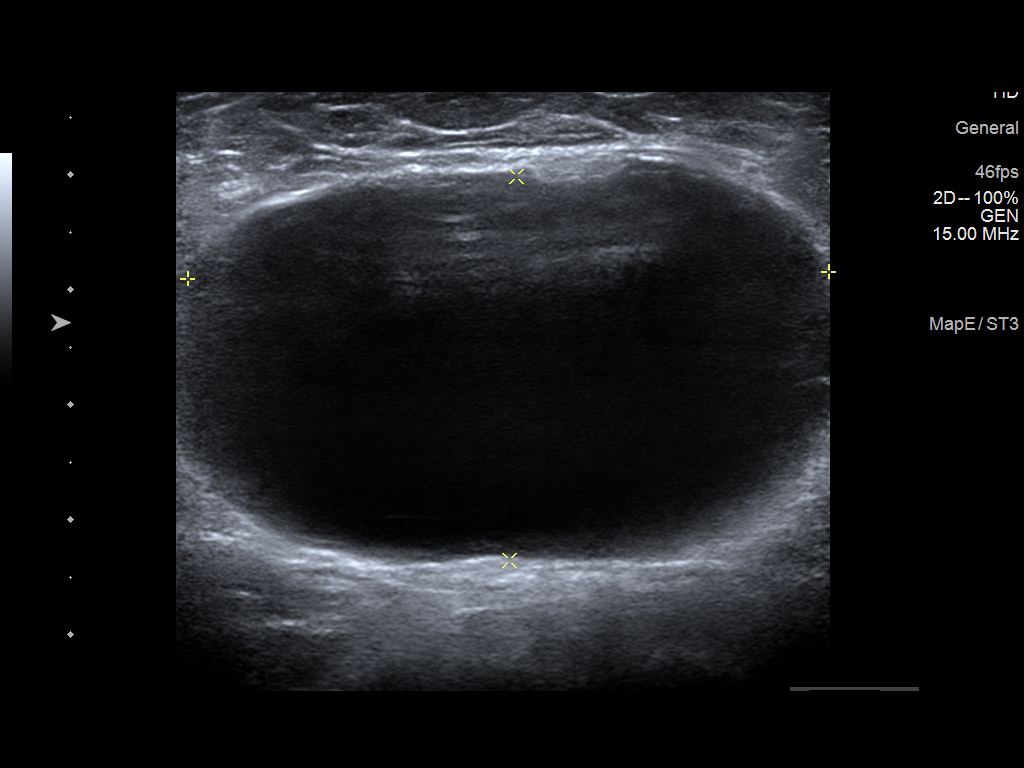
[im 3/6]
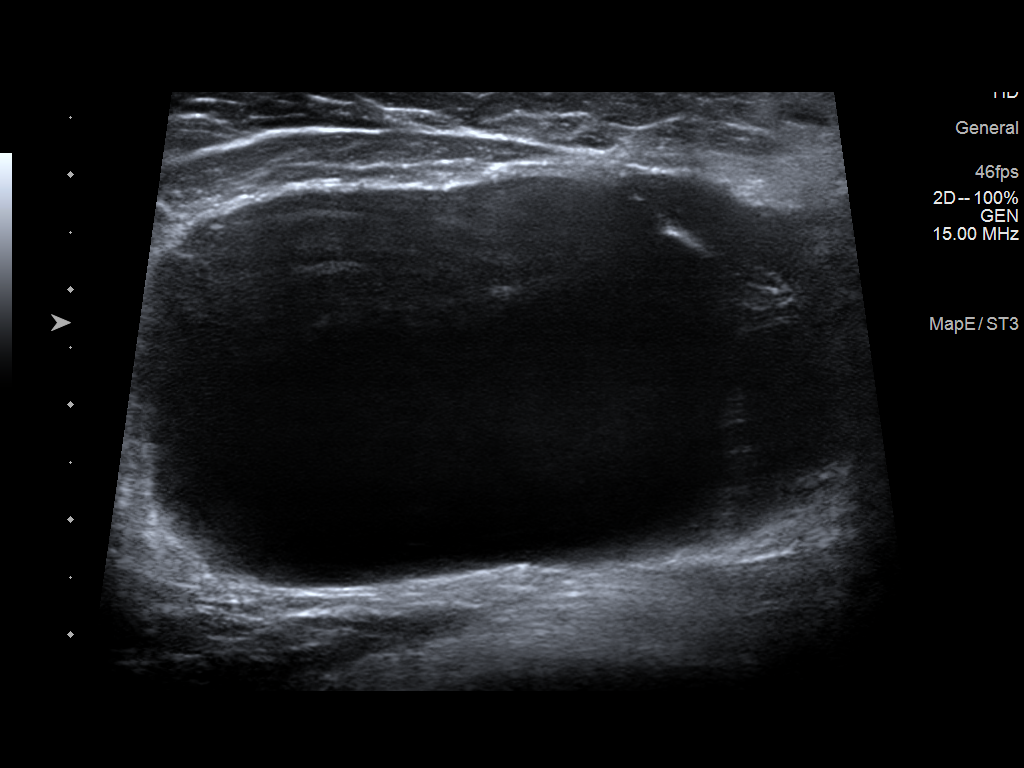
[im 4/6]
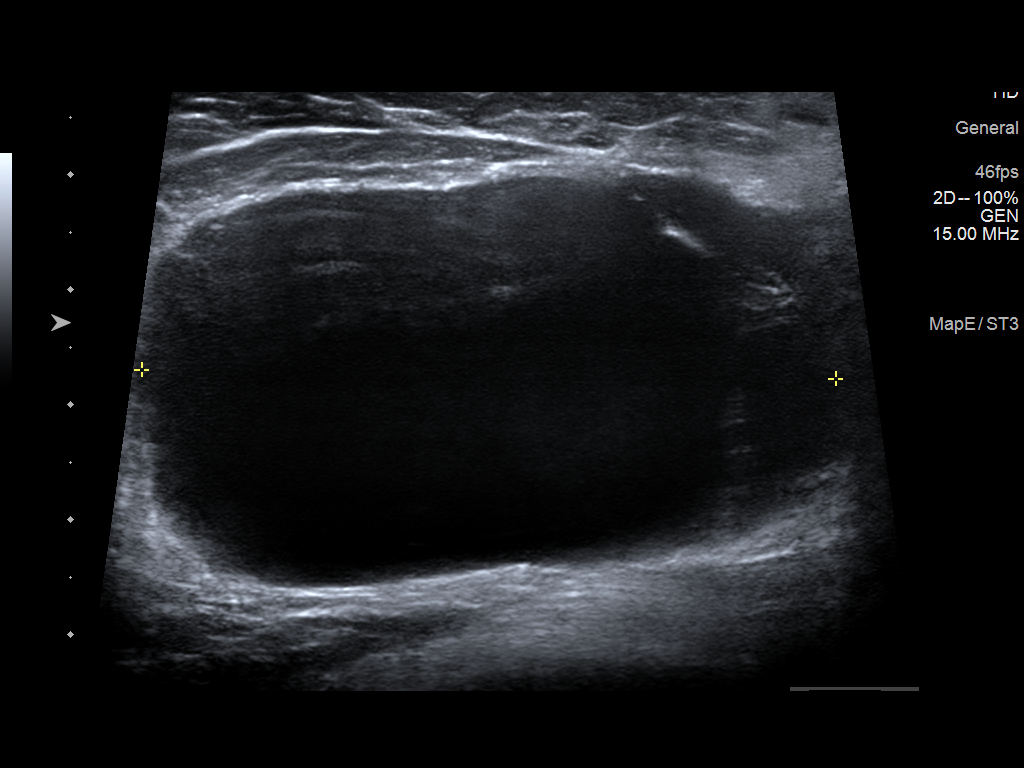
[im 5/6]
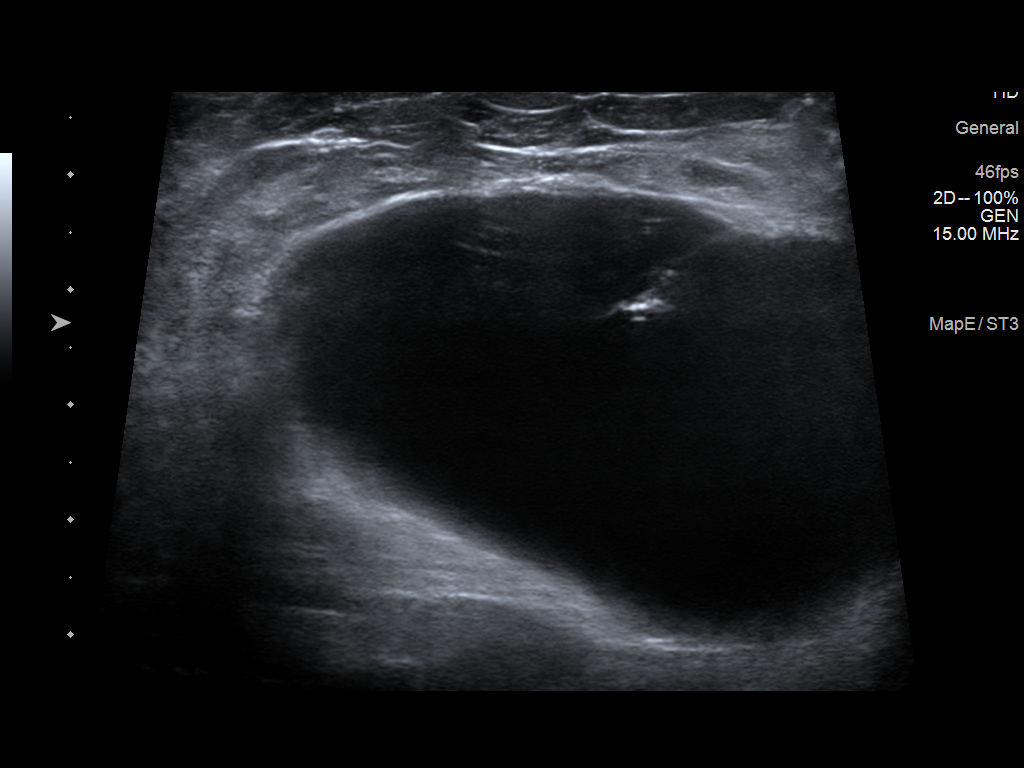
[im 6/6]
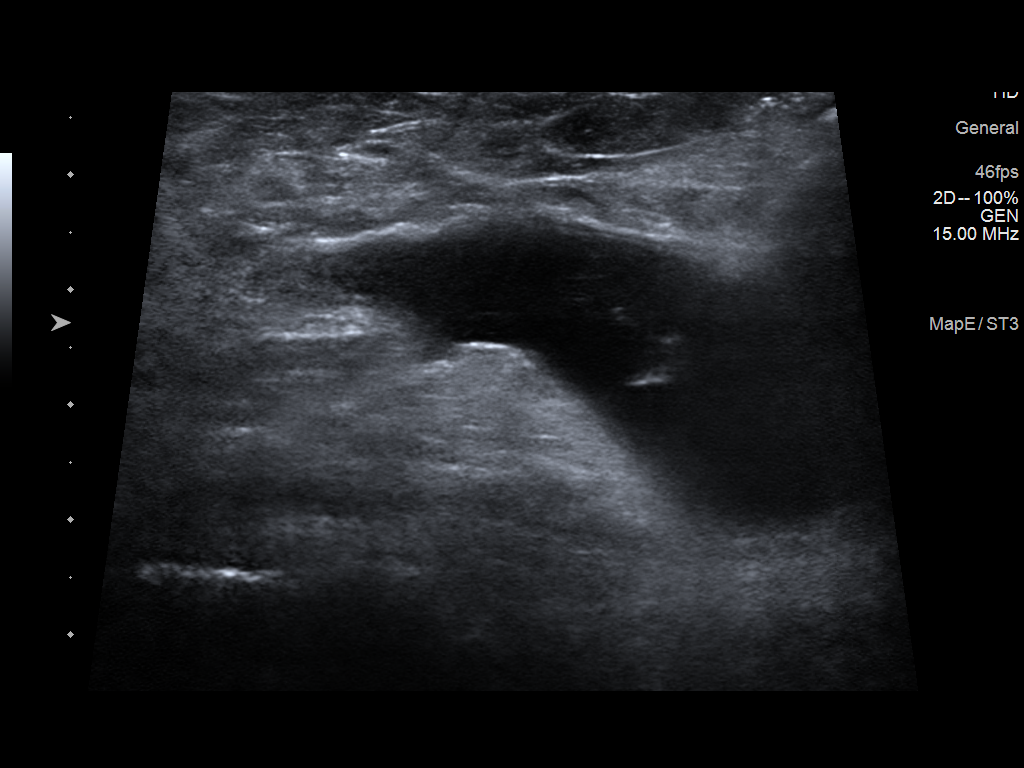

[6 of 6 positions shown; findings below may reference images not displayed]

FINDINGS: On physical exam, there is an approximately 6-7 cm palpable mass
deep to the healed surgical scar in the axillary tail of the left
breast/lower left axilla. No physical exam signs of infection.

Targeted ultrasound is performed, showing an oval slightly complex
fluid collection deep to the surgical scar in the axillary tail of
the left breast. This fluid collection measures 5.6 x 3.3 x 6.0 cm
and has sonographic appearances consistent with postoperative
seroma.
IMPRESSION: Approximately 6.0 cm palpable postoperative seroma axillary tail of
left breast.

RECOMMENDATION:
The patient and Dr. Kristine have requested that this be aspirated
under ultrasound guidance. This will be performed today and dictated
separately.

Additionally, the patient's routine annual mammography is due in
July 2019.

I have discussed the findings and recommendations with the patient.
Results were also provided in writing at the conclusion of the
visit. If applicable, a reminder letter will be sent to the patient
regarding the next appointment.

BI-RADS CATEGORY  2: Benign.

## 2019-07-25 ENCOUNTER — Other Ambulatory Visit: Payer: Self-pay | Admitting: Cardiology

## 2019-07-25 MED ORDER — FLECAINIDE ACETATE 100 MG PO TABS: 100.0000 mg | ORAL_TABLET | Freq: Two times a day (BID) | ORAL | 0 refills | Status: DC

## 2019-07-29 ENCOUNTER — Other Ambulatory Visit: Payer: Self-pay

## 2019-07-29 DIAGNOSIS — C50212 Malignant neoplasm of upper-inner quadrant of left female breast: Secondary | ICD-10-CM

## 2019-07-29 DIAGNOSIS — Z171 Estrogen receptor negative status [ER-]: Secondary | ICD-10-CM

## 2019-07-29 NOTE — Progress Notes (Signed)
Week and then coordinated    Patient Care Team: [name], MD as PCP - General (Internal Medicine) Rolm Bookbinder, MD as Consulting Physician (General Surgery)  DIAGNOSIS:    ICD-10-CM   1. Malignant neoplasm of upper-inner quadrant of left breast in female, estrogen receptor negative ([place])  C50.212    Z17.1     SUMMARY OF ONCOLOGIC HISTORY: Oncology History  Malignant neoplasm of upper-inner quadrant of left breast in female, estrogen receptor negative ([place])  08/17/2018 Initial Diagnosis   Screening mammogram detected abnormality in the left breast by ultrasound irregular mass 10:00 position 1.3 cm, 10 o'clock position no enlarged lymph nodes, ER 0%, PR 0%, Ki-67 80%, HER-2 +3+ by [facility]   08/28/2018 Cancer Staging   Staging form: Breast, AJCC 8th Edition - Clinical stage from 08/28/2018: Stage IA (cT1c, cN0, cM0, G3, ER-, PR-, HER2+) - Signed by Nicholas Lose, MD on 08/28/2018   08/28/2018 Breast MRI   malignancy within the UPPER INNER LEFT breast with dominant mass measuring 2.1 cm. With small adjacent satellite nodules, the entire area 1.5 x 3 x 2.2 cm.No evidence of abnormal lymph nodes or RIGHT breast malignancy.   09/03/2018 -  Chemotherapy   Weekly Taxol and Herceptin followed by maintenance Herceptin for one year.    12/05/2018 Surgery   Left lumpectomy: No residual invasive carcinoma, margins negative, 0/2 lymph nodes negative, complete pathologic response [id number]     CHIEF COMPLIANT: Herceptin maintenance  INTERVAL HISTORY: Andrea Clarke is a 73 y.o. with above-mentioned history of left breast cancer who underwent neoadjuvant chemotherapy, followed by lumpectomy, radiation, and is currently on maintenance Herceptin. Echo on 06/21/19 showed an ejection fraction of 60-65%. She presents to the clinic today for treatment.  She is tolerating Herceptin extremely well.  She has multiple questions with regards to her care and she wants to color it.  She also has  questions about immunizations.  Energy levels are improving.  REVIEW OF SYSTEMS:   Constitutional: Denies fevers, chills or abnormal weight loss Eyes: Denies blurriness of vision Ears, nose, mouth, throat, and face: Denies mucositis or sore throat Respiratory: Denies cough, dyspnea or wheezes Cardiovascular: Denies palpitation, chest discomfort Gastrointestinal: Denies nausea, heartburn or change in bowel habits Skin: Denies abnormal skin rashes Lymphatics: Denies new lymphadenopathy or easy bruising Neurological: Denies numbness, tingling or new weaknesses Behavioral/Psych: Mood is stable, no new changes  Extremities: No lower extremity edema Breast: denies any pain or lumps or nodules in either breasts All other systems were reviewed with the patient and are negative.  I have reviewed the past medical history, past surgical history, social history and family history with the patient and they are unchanged from previous note.  ALLERGIES:  is allergic to adhesive [tape]; oxycodone hcl; augmentin [amoxicillin-pot clavulanate]; and xylocaine [lidocaine hcl].  MEDICATIONS:  Current Outpatient Medications  Medication Sig Dispense Refill  . acetaminophen (TYLENOL) 500 MG tablet Take 1,000 mg by mouth daily as needed for moderate pain or headache.     Marland Kitchen atorvastatin (LIPITOR) 20 MG tablet Take 20 mg by mouth daily.    . brimonidine-timolol (COMBIGAN) 0.2-0.5 % ophthalmic solution Place 1 drop into both eyes every 12 (twelve) hours.    Marland Kitchen CARTIA XT 120 MG 24 hr capsule TAKE ONE CAPSULE BY MOUTH DAILY, STOPPING METOPROLOL 90 capsule 3  . cetirizine (ZYRTEC) 10 MG tablet Take 10 mg by mouth daily.    . Cholecalciferol (VITAMIN D3) 125 MCG (5000 UT) CAPS Take 5,000 Units by mouth at  bedtime.     . Coenzyme Q10 10 MG capsule Take 10 mg by mouth at bedtime.    Marland Kitchen dexlansoprazole (DEXILANT) 60 MG capsule Take 60 mg by mouth daily.    . flecainide (TAMBOCOR) 100 MG tablet Take 1 tablet (100 mg total)  by mouth 2 (two) times daily. Please make overdue appt with Dr. Curt Bears before anymore refills. 2nd attempt 15 tablet 0  . fluticasone (FLONASE) 50 MCG/ACT nasal spray Place 1 spray into both nostrils daily as needed for allergies.     . furosemide (LASIX) 20 MG tablet TAKE 1 TABLET(20 MG) BY MOUTH DAILY 30 tablet 0  . Krill Oil 500 MG CAPS Take 500 mg by mouth at bedtime.    . lidocaine-prilocaine (EMLA) cream Apply to affected area once 30 g 3  . LORazepam (ATIVAN) 0.5 MG tablet Take 1 tablet (0.5 mg total) by mouth at bedtime as needed (Nausea or vomiting). (Patient not taking: Reported on 12/20/2018) 30 tablet 0  . montelukast (SINGULAIR) 10 MG tablet Take 10 mg by mouth daily.    . Multiple Vitamin (MULTIVITAMIN) tablet Take 1 tablet by mouth at bedtime.     . ondansetron (ZOFRAN) 8 MG tablet Take 1 tablet (8 mg total) by mouth 2 (two) times daily as needed (Nausea or vomiting). (Patient not taking: Reported on 12/20/2018) 30 tablet 1  . Polyethyl Glycol-Propyl Glycol (SYSTANE OP) Place 1 drop into both eyes 2 (two) times daily as needed (dry eyes).    . prochlorperazine (COMPAZINE) 10 MG tablet TAKE 1 TABLET(10 MG) BY MOUTH EVERY 6 HOURS AS NEEDED FOR NAUSEA OR VOMITING (Patient not taking: No sig reported) 385 tablet 1  . quinapril (ACCUPRIL) 20 MG tablet Take 1 tablet (20 mg total) by mouth daily. Please make overdue appt with Dr. Johnsie Cancel before anymore refills. 3rd and Final attempt 15 tablet 0  . traMADol (ULTRAM) 50 MG tablet Take 2 tablets (100 mg total) by mouth every 6 (six) hours as needed. (Patient not taking: Reported on 12/20/2018) 12 tablet 0   No current facility-administered medications for this visit.    Facility-Administered Medications Ordered in Other Visits  Medication Dose Route Frequency Provider Last Rate Last Dose  . sodium chloride flush (NS) 0.9 % injection 10 mL  10 mL Intracatheter Once Nicholas Lose, MD        PHYSICAL EXAMINATION: ECOG PERFORMANCE STATUS: 1 -  Symptomatic but completely ambulatory  Vitals:   07/30/19 0927  BP: 133/66  Pulse: 60  Resp: 18  Temp: 98.3 F (36.8 C)  SpO2: 98%   Filed Weights   07/30/19 0927  Weight: 160 lb 11.2 oz (72.9 kg)    GENERAL: alert, no distress and comfortable SKIN: skin color, texture, turgor are normal, no rashes or significant lesions EYES: normal, Conjunctiva are pink and non-injected, sclera clear OROPHARYNX: no exudate, no erythema and lips, buccal mucosa, and tongue normal  NECK: supple, thyroid normal size, non-tender, without nodularity LYMPH: no palpable lymphadenopathy in the cervical, axillary or inguinal LUNGS: clear to auscultation and percussion with normal breathing effort HEART: regular rate & rhythm and no murmurs and no lower extremity edema ABDOMEN: abdomen soft, non-tender and normal bowel sounds MUSCULOSKELETAL: no cyanosis of digits and no clubbing  NEURO: alert & oriented x 3 with fluent speech, no focal motor/sensory deficits EXTREMITIES: No lower extremity edema  LABORATORY DATA:  I have reviewed the data as listed CMP Latest Ref Rng & Units 06/18/2019 05/07/2019 04/16/2019  Glucose 70 - 99  mg/dL 133(H) 143(H) 142(H)  BUN 8 - 23 mg/dL '17 14 11  ' Creatinine 0.44 - 1.00 mg/dL 0.75 0.75 0.73  Sodium 135 - 145 mmol/L 139 141 139  Potassium 3.5 - 5.1 mmol/L 4.2 4.6 4.3  Chloride 98 - 111 mmol/L 104 105 104  CO2 22 - 32 mmol/L '25 24 26  ' Calcium 8.9 - 10.3 mg/dL 9.1 9.2 8.9  Total Protein 6.5 - 8.1 g/dL 6.6 6.6 6.4(L)  Total Bilirubin 0.3 - 1.2 mg/dL 0.8 0.7 0.7  Alkaline Phos 38 - 126 U/L 92 101 96  AST 15 - 41 U/L 94(H) 129(H) 87(H)  ALT 0 - 44 U/L 128(H) 164(H) 121(H)    Lab Results  Component Value Date   WBC 6.0 07/30/2019   HGB 15.2 (H) 07/30/2019   HCT 42.3 07/30/2019   MCV 95.5 07/30/2019   PLT 155 07/30/2019   NEUTROABS 3.9 07/30/2019    ASSESSMENT & PLAN:  Malignant neoplasm of upper-inner quadrant of left breast in female, estrogen receptor negative  ([place]) 08/17/2018:Screening mammogram detected abnormality in the left breast by ultrasound irregular mass 10:00 position 1.3 cm, 10 o'clock position no enlarged lymph nodes, ER 0%, PR 0%, Ki-67 80%, HER-2 +3+ by IHC, grade 3, lymphovascular invasion present, T1c N0 stage Ia clinical stage  12/05/2018:Left lumpectomy: No residual invasive carcinoma, margins negative, 0/2 lymph nodes negative, complete pathologic response [id number] Adjuvant radiation therapy: 01/16/2019-02/11/2019  Treatment plan: Herceptin maintenance completed October 2020   Elevated LFTs: Ultrasound revealed fatty liver.   Return to clinic in 3 months for survivorship care plan visit.  After that I can see her in 6 months and after that once a year.    No orders of the defined types were placed in this encounter.  The patient has a good understanding of the overall plan. she agrees with it. she will call with any problems that may develop before the next visit here.  Nicholas Lose, MD 07/30/2019  Julious Oka Dorshimer am acting as scribe for Dr. Nicholas Lose.  I have reviewed the above documentation for accuracy and completeness, and I agree with the above.

## 2019-07-30 ENCOUNTER — Inpatient Hospital Stay (HOSPITAL_BASED_OUTPATIENT_CLINIC_OR_DEPARTMENT_OTHER): Payer: PRIVATE HEALTH INSURANCE | Admitting: Hematology and Oncology

## 2019-07-30 ENCOUNTER — Inpatient Hospital Stay: Payer: PRIVATE HEALTH INSURANCE

## 2019-07-30 ENCOUNTER — Other Ambulatory Visit: Payer: Self-pay

## 2019-07-30 ENCOUNTER — Telehealth: Payer: Self-pay | Admitting: *Deleted

## 2019-07-30 ENCOUNTER — Inpatient Hospital Stay: Payer: PRIVATE HEALTH INSURANCE | Attending: Hematology and Oncology

## 2019-07-30 DIAGNOSIS — C50212 Malignant neoplasm of upper-inner quadrant of left female breast: Secondary | ICD-10-CM

## 2019-07-30 DIAGNOSIS — Z171 Estrogen receptor negative status [ER-]: Secondary | ICD-10-CM

## 2019-07-30 DIAGNOSIS — Z5112 Encounter for antineoplastic immunotherapy: Secondary | ICD-10-CM | POA: Insufficient documentation

## 2019-07-30 DIAGNOSIS — Z95828 Presence of other vascular implants and grafts: Secondary | ICD-10-CM

## 2019-07-30 LAB — CMP (CANCER CENTER ONLY)
ALT: 112 U/L — ABNORMAL HIGH (ref 0–44)
AST: 81 U/L — ABNORMAL HIGH (ref 15–41)
Albumin: 4 g/dL (ref 3.5–5.0)
Alkaline Phosphatase: 105 U/L (ref 38–126)
Anion gap: 11 (ref 5–15)
BUN: 16 mg/dL (ref 8–23)
CO2: 25 mmol/L (ref 22–32)
Calcium: 9.1 mg/dL (ref 8.9–10.3)
Chloride: 103 mmol/L (ref 98–111)
Creatinine: 0.78 mg/dL (ref 0.44–1.00)
GFR, Est AFR Am: >60 mL/min (ref >60)
GFR, Estimated: >60 mL/min (ref >60)
Glucose, Bld: 137 mg/dL — ABNORMAL HIGH (ref 70–99)
Potassium: 4.2 mmol/L (ref 3.5–5.1)
Sodium: 139 mmol/L (ref 135–145)
Total Bilirubin: 0.7 mg/dL (ref 0.3–1.2)
Total Protein: 6.9 g/dL (ref 6.5–8.1)

## 2019-07-30 LAB — CBC WITH DIFFERENTIAL (CANCER CENTER ONLY)
Abs Immature Granulocytes: 0.03 10*3/uL (ref 0.00–0.07)
Basophils Absolute: 0 10*3/uL (ref 0.0–0.1)
Basophils Relative: 0 %
Eosinophils Absolute: 0.2 10*3/uL (ref 0.0–0.5)
Eosinophils Relative: 3 %
HCT: 42.3 % (ref 36.0–46.0)
Hemoglobin: 15.2 g/dL — ABNORMAL HIGH (ref 12.0–15.0)
Immature Granulocytes: 1 %
Lymphocytes Relative: 22 %
Lymphs Abs: 1.3 10*3/uL (ref 0.7–4.0)
MCH: 34.3 pg — ABNORMAL HIGH (ref 26.0–34.0)
MCHC: 35.9 g/dL (ref 30.0–36.0)
MCV: 95.5 fL (ref 80.0–100.0)
Monocytes Absolute: 0.5 10*3/uL (ref 0.1–1.0)
Monocytes Relative: 8 %
Neutro Abs: 3.9 10*3/uL (ref 1.7–7.7)
Neutrophils Relative %: 66 %
Platelet Count: 155 10*3/uL (ref 150–400)
RBC: 4.43 MIL/uL (ref 3.87–5.11)
RDW: 11.4 % — ABNORMAL LOW (ref 11.5–15.5)
WBC Count: 6 10*3/uL (ref 4.0–10.5)
nRBC: 0 % (ref 0.0–0.2)

## 2019-07-30 MED ORDER — SODIUM CHLORIDE 0.9% FLUSH
10.0000 mL | INTRAVENOUS | Status: DC | PRN
  Administered 2019-07-30: 10 mL
  Filled 2019-07-30: qty 10

## 2019-07-30 MED ORDER — ACETAMINOPHEN 325 MG PO TABS
650.0000 mg | ORAL_TABLET | Freq: Once | ORAL | Status: AC
  Administered 2019-07-30: 650 mg via ORAL

## 2019-07-30 MED ORDER — SODIUM CHLORIDE 0.9 % IV SOLN
Freq: Once | INTRAVENOUS | Status: AC
  Administered 2019-07-30: 10:00:00 via INTRAVENOUS
  Filled 2019-07-30: qty 250

## 2019-07-30 MED ORDER — DIPHENHYDRAMINE HCL 25 MG PO CAPS
ORAL_CAPSULE | ORAL | Status: AC
  Filled 2019-07-30: qty 1

## 2019-07-30 MED ORDER — DIPHENHYDRAMINE HCL 50 MG/ML IJ SOLN
INTRAMUSCULAR | Status: AC
  Filled 2019-07-30: qty 1

## 2019-07-30 MED ORDER — TRASTUZUMAB-DKST CHEMO 150 MG IV SOLR
450.0000 mg | Freq: Once | INTRAVENOUS | Status: AC
  Administered 2019-07-30: 450 mg via INTRAVENOUS
  Filled 2019-07-30: qty 21.43

## 2019-07-30 MED ORDER — ACETAMINOPHEN 325 MG PO TABS
ORAL_TABLET | ORAL | Status: AC
  Filled 2019-07-30: qty 2

## 2019-07-30 MED ORDER — HEPARIN SOD (PORK) LOCK FLUSH 100 UNIT/ML IV SOLN
500.0000 [IU] | Freq: Once | INTRAVENOUS | Status: AC | PRN
  Administered 2019-07-30: 500 [IU]
  Filled 2019-07-30: qty 5

## 2019-07-30 MED ORDER — SODIUM CHLORIDE 0.9% FLUSH
10.0000 mL | Freq: Once | INTRAVENOUS | Status: AC
  Administered 2019-07-30: 10 mL
  Filled 2019-07-30: qty 10

## 2019-07-30 MED ORDER — DIPHENHYDRAMINE HCL 50 MG/ML IJ SOLN
25.0000 mg | Freq: Once | INTRAMUSCULAR | Status: AC
  Administered 2019-07-30: 25 mg via INTRAVENOUS

## 2019-07-30 NOTE — Assessment & Plan Note (Signed)
08/17/2018:Screening mammogram detected abnormality in the left breast by ultrasound irregular mass 10:00 position 1.3 cm, 10 o'clock position no enlarged lymph nodes, ER 0%, PR 0%, Ki-67 80%, HER-2 +3+ by IHC, grade 3, lymphovascular invasion present, T1c N0 stage Ia clinical stage  12/05/2018:Left lumpectomy: No residual invasive carcinoma, margins negative, 0/2 lymph nodes negative, complete pathologic response [id number] Adjuvant radiation therapy: 01/16/2019-02/11/2019  Treatment plan: Herceptin maintenance completed October 2020   Elevated LFTs: Ultrasound revealed fatty liver.   Return to clinic in 6 months for survivorship care plan visit.  After that I can see her once a year.

## 2019-07-30 NOTE — Telephone Encounter (Signed)
Called and spoke with patient to follow up to see how she is doing.  Patient states she is doing really well.  She is here today for herceptin only.  Encouraged patient to call with any needs or concerns.

## 2019-07-30 NOTE — Patient Instructions (Signed)
[facility] Discharge Instructions for Patients Receiving Chemotherapy  Today you received the following chemotherapy agents: trastuzumab  To help prevent nausea and vomiting after your treatment, we encourage you to take your nausea medication  as prescribed.    If you develop nausea and vomiting that is not controlled by your nausea medication, call the clinic.   BELOW ARE SYMPTOMS THAT SHOULD BE REPORTED IMMEDIATELY:  *FEVER GREATER THAN 100.5 F  *CHILLS WITH OR WITHOUT FEVER  NAUSEA AND VOMITING THAT IS NOT CONTROLLED WITH YOUR NAUSEA MEDICATION  *UNUSUAL SHORTNESS OF BREATH  *UNUSUAL BRUISING OR BLEEDING  TENDERNESS IN MOUTH AND THROAT WITH OR WITHOUT PRESENCE OF ULCERS  *URINARY PROBLEMS  *BOWEL PROBLEMS  UNUSUAL RASH Items with * indicate a potential emergency and should be followed up as soon as possible.  Feel free to call the clinic should you have any questions or concerns. The clinic phone number is [phone].  Please show the CHEMO ALERT CARD at check-in to the Emergency Department and triage nurse.     

## 2019-07-31 ENCOUNTER — Telehealth: Payer: Self-pay | Admitting: Hematology and Oncology

## 2019-07-31 ENCOUNTER — Other Ambulatory Visit: Payer: Self-pay | Admitting: Cardiology

## 2019-07-31 NOTE — Telephone Encounter (Signed)
I talk with patient regarding schedule  

## 2019-08-01 ENCOUNTER — Ambulatory Visit
Admission: RE | Admit: 2019-08-01 | Discharge: 2019-08-01 | Disposition: A | Discharge status: Home / Self Care | Payer: PRIVATE HEALTH INSURANCE | Source: Ambulatory Visit | Attending: Hematology and Oncology | Admitting: Hematology and Oncology

## 2019-08-01 ENCOUNTER — Other Ambulatory Visit: Payer: Self-pay | Admitting: Hematology and Oncology

## 2019-08-01 ENCOUNTER — Other Ambulatory Visit: Payer: Self-pay

## 2019-08-01 DIAGNOSIS — Z171 Estrogen receptor negative status [ER-]: Secondary | ICD-10-CM

## 2019-08-01 DIAGNOSIS — C50212 Malignant neoplasm of upper-inner quadrant of left female breast: Secondary | ICD-10-CM

## 2019-08-04 ENCOUNTER — Other Ambulatory Visit: Payer: Self-pay | Admitting: Cardiology

## 2019-08-19 ENCOUNTER — Telehealth: Payer: Self-pay

## 2019-08-19 NOTE — Telephone Encounter (Signed)
Provided information below to patient.  "Andrea Clarke Helen [phone]).  Returning call to nurse.  Concerned about mammogram and needed to know if an MRI will be ordered.  Do I or you all contact the surgeon?  I will be in tomorrow morning for treatment if Kathlee Nations or Dr. Lindi Adie will stop by."

## 2019-08-19 NOTE — Telephone Encounter (Signed)
Pt voiced concerns with results from diagnostic mammogram on 10/15 showing difference in seroma compared to results from May.  Pt would like for MD to review, pt requesting MRI.    RN reviewed with MD.  MD recommendations to follow up with surgeon.   RN left voicemail for patient to return call for updated information.

## 2019-08-20 ENCOUNTER — Encounter (HOSPITAL_BASED_OUTPATIENT_CLINIC_OR_DEPARTMENT_OTHER): Payer: Self-pay | Admitting: *Deleted

## 2019-08-20 ENCOUNTER — Other Ambulatory Visit: Payer: Self-pay

## 2019-08-20 ENCOUNTER — Inpatient Hospital Stay: Payer: PRIVATE HEALTH INSURANCE | Attending: Hematology and Oncology

## 2019-08-20 VITALS — BP 135/78 | HR 61 | Temp 98.2°F | Resp 17

## 2019-08-20 DIAGNOSIS — C50212 Malignant neoplasm of upper-inner quadrant of left female breast: Secondary | ICD-10-CM | POA: Diagnosis present

## 2019-08-20 DIAGNOSIS — Z5112 Encounter for antineoplastic immunotherapy: Secondary | ICD-10-CM | POA: Diagnosis not present

## 2019-08-20 MED ORDER — ACETAMINOPHEN 325 MG PO TABS
ORAL_TABLET | ORAL | Status: AC
  Filled 2019-08-20: qty 2

## 2019-08-20 MED ORDER — ACETAMINOPHEN 325 MG PO TABS
650.0000 mg | ORAL_TABLET | Freq: Once | ORAL | Status: AC
  Administered 2019-08-20: 09:00:00 650 mg via ORAL

## 2019-08-20 MED ORDER — DIPHENHYDRAMINE HCL 50 MG/ML IJ SOLN
INTRAMUSCULAR | Status: AC
  Filled 2019-08-20: qty 1

## 2019-08-20 MED ORDER — SODIUM CHLORIDE 0.9 % IV SOLN
Freq: Once | INTRAVENOUS | Status: AC
  Administered 2019-08-20: 09:00:00 via INTRAVENOUS
  Filled 2019-08-20: qty 250

## 2019-08-20 MED ORDER — DIPHENHYDRAMINE HCL 50 MG/ML IJ SOLN
25.0000 mg | Freq: Once | INTRAMUSCULAR | Status: AC
  Administered 2019-08-20: 25 mg via INTRAVENOUS

## 2019-08-20 MED ORDER — TRASTUZUMAB-DKST CHEMO 150 MG IV SOLR
450.0000 mg | Freq: Once | INTRAVENOUS | Status: AC
  Administered 2019-08-20: 450 mg via INTRAVENOUS
  Filled 2019-08-20: qty 21.43

## 2019-08-20 MED ORDER — SODIUM CHLORIDE 0.9% FLUSH
10.0000 mL | INTRAVENOUS | Status: DC | PRN
  Administered 2019-08-20: 10 mL
  Filled 2019-08-20: qty 10

## 2019-08-20 MED ORDER — HEPARIN SOD (PORK) LOCK FLUSH 100 UNIT/ML IV SOLN
500.0000 [IU] | Freq: Once | INTRAVENOUS | Status: AC | PRN
  Administered 2019-08-20: 500 [IU]
  Filled 2019-08-20: qty 5

## 2019-08-20 NOTE — Patient Instructions (Signed)
[facility] Discharge Instructions for Patients Receiving Chemotherapy  Today you received the following chemotherapy agents: trastuzumab  To help prevent nausea and vomiting after your treatment, we encourage you to take your nausea medication  as prescribed.    If you develop nausea and vomiting that is not controlled by your nausea medication, call the clinic.   BELOW ARE SYMPTOMS THAT SHOULD BE REPORTED IMMEDIATELY:  *FEVER GREATER THAN 100.5 F  *CHILLS WITH OR WITHOUT FEVER  NAUSEA AND VOMITING THAT IS NOT CONTROLLED WITH YOUR NAUSEA MEDICATION  *UNUSUAL SHORTNESS OF BREATH  *UNUSUAL BRUISING OR BLEEDING  TENDERNESS IN MOUTH AND THROAT WITH OR WITHOUT PRESENCE OF ULCERS  *URINARY PROBLEMS  *BOWEL PROBLEMS  UNUSUAL RASH Items with * indicate a potential emergency and should be followed up as soon as possible.  Feel free to call the clinic should you have any questions or concerns. The clinic phone number is [phone].  Please show the CHEMO ALERT CARD at check-in to the Emergency Department and triage nurse.     

## 2019-08-21 ENCOUNTER — Ambulatory Visit: Payer: PRIVATE HEALTH INSURANCE

## 2019-08-21 ENCOUNTER — Encounter: Payer: Self-pay | Admitting: *Deleted

## 2019-08-22 ENCOUNTER — Other Ambulatory Visit: Payer: Self-pay | Admitting: General Surgery

## 2019-08-23 ENCOUNTER — Other Ambulatory Visit (HOSPITAL_COMMUNITY)
Admission: RE | Admit: 2019-08-23 | Discharge: 2019-08-23 | Disposition: A | Discharge status: Home / Self Care | Payer: PRIVATE HEALTH INSURANCE | Source: Ambulatory Visit | Attending: General Surgery | Admitting: General Surgery

## 2019-08-23 DIAGNOSIS — Z20828 Contact with and (suspected) exposure to other viral communicable diseases: Secondary | ICD-10-CM | POA: Insufficient documentation

## 2019-08-23 DIAGNOSIS — Z01812 Encounter for preprocedural laboratory examination: Secondary | ICD-10-CM | POA: Insufficient documentation

## 2019-08-24 LAB — NOVEL CORONAVIRUS, NAA (HOSP ORDER, SEND-OUT TO REF LAB; TAT 18-24 HRS): SARS-CoV-2, NAA: NOT DETECTED

## 2019-08-27 ENCOUNTER — Encounter (HOSPITAL_BASED_OUTPATIENT_CLINIC_OR_DEPARTMENT_OTHER)
Admission: RE | Disposition: A | Discharge status: Home / Self Care | Payer: Self-pay | Source: Home / Self Care | Attending: General Surgery

## 2019-08-27 ENCOUNTER — Encounter (HOSPITAL_BASED_OUTPATIENT_CLINIC_OR_DEPARTMENT_OTHER): Payer: Self-pay | Admitting: Certified Registered"

## 2019-08-27 ENCOUNTER — Ambulatory Visit (HOSPITAL_BASED_OUTPATIENT_CLINIC_OR_DEPARTMENT_OTHER)
Admission: RE | Admit: 2019-08-27 | Discharge: 2019-08-27 | Disposition: A | Discharge status: Home / Self Care | Payer: PRIVATE HEALTH INSURANCE | Attending: General Surgery | Admitting: General Surgery

## 2019-08-27 ENCOUNTER — Encounter (HOSPITAL_BASED_OUTPATIENT_CLINIC_OR_DEPARTMENT_OTHER): Payer: Self-pay | Admitting: *Deleted

## 2019-08-27 DIAGNOSIS — K219 Gastro-esophageal reflux disease without esophagitis: Secondary | ICD-10-CM | POA: Diagnosis not present

## 2019-08-27 DIAGNOSIS — Z79899 Other long term (current) drug therapy: Secondary | ICD-10-CM | POA: Insufficient documentation

## 2019-08-27 DIAGNOSIS — Z87891 Personal history of nicotine dependence: Secondary | ICD-10-CM | POA: Diagnosis not present

## 2019-08-27 DIAGNOSIS — Z923 Personal history of irradiation: Secondary | ICD-10-CM | POA: Insufficient documentation

## 2019-08-27 DIAGNOSIS — I10 Essential (primary) hypertension: Secondary | ICD-10-CM | POA: Insufficient documentation

## 2019-08-27 DIAGNOSIS — H409 Unspecified glaucoma: Secondary | ICD-10-CM | POA: Insufficient documentation

## 2019-08-27 DIAGNOSIS — Z9221 Personal history of antineoplastic chemotherapy: Secondary | ICD-10-CM | POA: Diagnosis not present

## 2019-08-27 DIAGNOSIS — Z853 Personal history of malignant neoplasm of breast: Secondary | ICD-10-CM | POA: Diagnosis not present

## 2019-08-27 DIAGNOSIS — E785 Hyperlipidemia, unspecified: Secondary | ICD-10-CM | POA: Insufficient documentation

## 2019-08-27 DIAGNOSIS — Z452 Encounter for adjustment and management of vascular access device: Secondary | ICD-10-CM | POA: Diagnosis not present

## 2019-08-27 HISTORY — PX: PORT-A-CATH REMOVAL: SHX5289

## 2019-08-27 SURGERY — MINOR REMOVAL PORT-A-CATH
Anesthesia: LOCAL | Site: Chest

## 2019-08-27 MED ORDER — BUPIVACAINE HCL (PF) 0.25 % IJ SOLN
INTRAMUSCULAR | Status: DC | PRN
  Administered 2019-08-27: 10 mL

## 2019-08-27 MED ORDER — ACETAMINOPHEN 500 MG PO TABS
ORAL_TABLET | ORAL | Status: AC
  Filled 2019-08-27: qty 2

## 2019-08-27 MED ORDER — GABAPENTIN 100 MG PO CAPS
100.0000 mg | ORAL_CAPSULE | ORAL | Status: AC
  Administered 2019-08-27: 100 mg via ORAL

## 2019-08-27 MED ORDER — LIDOCAINE HCL (PF) 1 % IJ SOLN
INTRAMUSCULAR | Status: AC
  Filled 2019-08-27: qty 30

## 2019-08-27 MED ORDER — METHYLENE BLUE 0.5 % INJ SOLN
INTRAVENOUS | Status: AC
  Filled 2019-08-27: qty 10

## 2019-08-27 MED ORDER — ENSURE PRE-SURGERY PO LIQD: 296.0000 mL | Freq: Once | ORAL | Status: DC

## 2019-08-27 MED ORDER — SODIUM CHLORIDE (PF) 0.9 % IJ SOLN
INTRAMUSCULAR | Status: AC
  Filled 2019-08-27: qty 10

## 2019-08-27 MED ORDER — BUPIVACAINE HCL (PF) 0.25 % IJ SOLN
INTRAMUSCULAR | Status: AC
  Filled 2019-08-27: qty 180

## 2019-08-27 MED ORDER — ACETAMINOPHEN 500 MG PO TABS
1000.0000 mg | ORAL_TABLET | ORAL | Status: AC
  Administered 2019-08-27: 1000 mg via ORAL

## 2019-08-27 MED ORDER — GABAPENTIN 100 MG PO CAPS
ORAL_CAPSULE | ORAL | Status: AC
  Filled 2019-08-27: qty 1

## 2019-08-27 SURGICAL SUPPLY — 33 items
ADH SKN CLS APL DERMABOND .7 (GAUZE/BANDAGES/DRESSINGS) ×2
APL PRP STRL LF DISP 70% ISPRP (MISCELLANEOUS) ×2
BLADE SURG 15 STRL LF DISP TIS (BLADE) ×2 IMPLANT
BLADE SURG 15 STRL SS (BLADE) ×3
CHLORAPREP W/TINT 26 (MISCELLANEOUS) ×3 IMPLANT
COVER BACK TABLE REUSABLE LG (DRAPES) ×3 IMPLANT
COVER MAYO STAND REUSABLE (DRAPES) ×3 IMPLANT
COVER WAND RF STERILE (DRAPES) IMPLANT
DECANTER SPIKE VIAL GLASS SM (MISCELLANEOUS) ×3 IMPLANT
DERMABOND ADVANCED (GAUZE/BANDAGES/DRESSINGS) ×1
DERMABOND ADVANCED .7 DNX12 (GAUZE/BANDAGES/DRESSINGS) ×2 IMPLANT
DRAPE LAPAROTOMY 100X72 PEDS (DRAPES) ×3 IMPLANT
DRAPE UTILITY XL STRL (DRAPES) ×3 IMPLANT
ELECT COATED BLADE 2.86 ST (ELECTRODE) ×3 IMPLANT
ELECT REM PT RETURN 9FT ADLT (ELECTROSURGICAL) ×3
ELECTRODE REM PT RTRN 9FT ADLT (ELECTROSURGICAL) ×2 IMPLANT
GLOVE BIO SURGEON STRL SZ7 (GLOVE) ×3 IMPLANT
GLOVE BIOGEL PI IND STRL 6.5 (GLOVE) ×1 IMPLANT
GLOVE BIOGEL PI IND STRL 7.5 (GLOVE) ×2 IMPLANT
GLOVE BIOGEL PI INDICATOR 6.5 (GLOVE) ×1
GLOVE BIOGEL PI INDICATOR 7.5 (GLOVE) ×1
GLOVE ECLIPSE 6.5 STRL STRAW (GLOVE) ×2 IMPLANT
GOWN STRL REUS W/ TWL LRG LVL3 (GOWN DISPOSABLE) ×4 IMPLANT
GOWN STRL REUS W/TWL LRG LVL3 (GOWN DISPOSABLE) ×6
NDL HYPO 25X1 1.5 SAFETY (NEEDLE) ×1 IMPLANT
NEEDLE HYPO 25X1 1.5 SAFETY (NEEDLE) ×3 IMPLANT
PACK BASIN DAY SURGERY FS (CUSTOM PROCEDURE TRAY) ×3 IMPLANT
PENCIL SMOKE EVACUATOR (MISCELLANEOUS) ×3 IMPLANT
SLEEVE SCD COMPRESS KNEE MED (MISCELLANEOUS) IMPLANT
SUT MNCRL AB 4-0 PS2 18 (SUTURE) ×3 IMPLANT
SUT VIC AB 3-0 SH 27 (SUTURE) ×3
SUT VIC AB 3-0 SH 27X BRD (SUTURE) ×2 IMPLANT
SYR CONTROL 10ML LL (SYRINGE) ×3 IMPLANT

## 2019-08-27 NOTE — Discharge Instructions (Signed)
° ° °  Always review your discharge instruction sheet given to you by the facility where your surgery was performed.   1. A prescription for pain medication may be given to you upon discharge. Take your pain medication as prescribed, if needed. If narcotic pain medicine is not needed, then you make take acetaminophen (Tylenol) or ibuprofen (Advil) as needed.  2. Take your usually prescribed medications unless otherwise directed. 3. If you need a refill on your pain medication, please contact our office. All narcotic pain medicine now requires a paper prescription.  Phoned in and fax refills are no longer allowed by law.  Prescriptions will not be filled after 5 pm or on weekends.  4. You can resume regular diet.  5. Most patients will experience some mild swelling and/or bruising in the area of the incision. It may take several days to resolve. 6. It is common to experience some constipation if taking pain medication after surgery. Increasing fluid intake and taking a stool softener (such as Colace) will usually help or prevent this problem from occurring. A mild laxative (Milk of Magnesia or Miralax) should be taken according to package directions if there are no bowel movements after 48 hours.  7. I used Dermabond (skin glue) on the incision, you may shower in 24 hours.  The glue will flake off over the next 2-3 weeks.  8. ACTIVITIES: resume normal activity as tolerated   WHEN TO CALL YOUR DOCTOR [phone]): 1. Fever over 101.0 2. Chills 3. Continued bleeding from incision 4. Increased redness and tenderness at the site 5. Shortness of breath, difficulty breathing   The clinic staff is available to answer your questions during regular business hours. Please dont hesitate to call and ask to speak to one of the nurses or medical assistants for clinical concerns. If you have a medical emergency, go to the nearest emergency room or call 911.  A surgeon from [facility] is always on  call at the hospital.     For further information, please visit [email or web address]

## 2019-08-27 NOTE — H&P (Signed)
Andrea Clarke is an 73 y.o. female.   Chief Complaint: breast cancer HPI: 76 yof completed therapy for breast cancer, here for port removal. No complaints.   Past Medical History:  Diagnosis Date  . Atrial tachycardia ([place])   . Breast cancer ([place])    left breast cancer 2019  . 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 Allegra Lai)  . GERD (gastroesophageal reflux disease)   . Glaucoma   . Hyperlipidemia   . Hypertension   . Personal history of chemotherapy   . Personal history of radiation therapy    finished 02/11/19  . PONV (postoperative nausea and vomiting)    did well with lumpectomy and port placement    Past Surgical History:  Procedure Laterality Date  . ABDOMINAL HYSTERECTOMY    . ANKLE SURGERY    . APPENDECTOMY    . BREAST BIOPSY     Left  . BREAST LUMPECTOMY Left 12/05/2018  . BREAST LUMPECTOMY WITH RADIOACTIVE SEED AND SENTINEL LYMPH NODE BIOPSY N/A 12/05/2018   Procedure: LEFT BREAST LUMPECTOMY WITH RADIOACTIVE SEED AND LEFT AXILLARY SENTINEL LYMPH NODE BIOPSY AND BLUE DYE INJECTION;  Surgeon: Rolm Bookbinder, MD;  Location: [facility];  Service: General;  Laterality: N/A;  . CATARACT EXTRACTION W/ INTRAOCULAR LENS IMPLANT Bilateral   . PORTACATH PLACEMENT Right 09/03/2018   Procedure: INSERTION PORT-A-CATH WITH [place];  Surgeon: Rolm Bookbinder, MD;  Location: [facility];  Service: General;  Laterality: Right;  . ROTATOR CUFF REPAIR Right   . TONSILLECTOMY      Family History  Problem Relation Age of Onset  . Hypertension Mother   . Breast cancer Neg Hx    Social History:  reports that she has quit smoking. She has never used smokeless tobacco. She reports current alcohol use. She reports that she does not use drugs.  Allergies:  Allergies  Allergen Reactions  . Adhesive [Tape] Other (See Comments)    Pulls skin off  . Oxycodone Hcl Hives  . Augmentin [Amoxicillin-Pot Clavulanate] Diarrhea    Has patient had a PCN  reaction causing immediate rash, facial/tongue/throat swelling, SOB or lightheadedness with hypotension: No Has patient had a PCN reaction causing severe rash involving mucus membranes or skin necrosis: No Has patient had a PCN reaction that required hospitalization: No Has patient had a PCN reaction occurring within the last 10 years: Yes If all of the above answers are "NO", then may proceed with Cephalosporin use.   . Xylocaine [Lidocaine Hcl] Palpitations    For Dental Procedures     Medications Prior to Admission  Medication Sig Dispense Refill  . acetaminophen (TYLENOL) 500 MG tablet Take 1,000 mg by mouth daily as needed for moderate pain or headache.     Marland Kitchen atorvastatin (LIPITOR) 20 MG tablet Take 20 mg by mouth daily.    Marland Kitchen CARTIA XT 120 MG 24 hr capsule TAKE ONE CAPSULE BY MOUTH DAILY, STOPPING METOPROLOL 90 capsule 3  . cetirizine (ZYRTEC) 10 MG tablet Take 10 mg by mouth daily.    . Cholecalciferol (VITAMIN D3) 125 MCG (5000 UT) CAPS Take 5,000 Units by mouth at bedtime.     . Coenzyme Q10 10 MG capsule Take 10 mg by mouth at bedtime.    Marland Kitchen dexlansoprazole (DEXILANT) 60 MG capsule Take 60 mg by mouth daily.    . flecainide (TAMBOCOR) 100 MG tablet Take 100 mg by mouth 2 (two) times daily.    . fluticasone (FLONASE) 50 MCG/ACT nasal  spray Place 1 spray into both nostrils daily as needed for allergies.     . furosemide (LASIX) 20 MG tablet TAKE 1 TABLET(20 MG) BY MOUTH DAILY 30 tablet 0  . Krill Oil 500 MG CAPS Take 500 mg by mouth at bedtime.    . lidocaine-prilocaine (EMLA) cream Apply to affected area once 30 g 3  . montelukast (SINGULAIR) 10 MG tablet Take 10 mg by mouth daily.    . Multiple Vitamin (MULTIVITAMIN) tablet Take 1 tablet by mouth at bedtime.     Vladimir Faster Glycol-Propyl Glycol (SYSTANE OP) Place 1 drop into both eyes 2 (two) times daily as needed (dry eyes).    . quinapril (ACCUPRIL) 20 MG tablet Take 1 tablet (20 mg total) by mouth daily. Please make overdue  appt with Dr. Johnsie Cancel before anymore refills. 3rd and Final attempt 15 tablet 0  . brimonidine-timolol (COMBIGAN) 0.2-0.5 % ophthalmic solution Place 1 drop into both eyes every 12 (twelve) hours.      No results found for this or any previous visit (from the past 48 hour(s)). No results found.  ROS Negative  Blood pressure (!) 153/75, pulse 68, temperature 98.3 F (36.8 C), temperature source Oral, resp. rate 18, height 5\' 3"  (1.6 m), weight 72.9 kg, SpO2 98 %. Physical Exam  Constitutional: She appears well-developed and well-nourished.  Cardiovascular: Normal rate and regular rhythm.  Respiratory: Effort normal.  Right ij port in place     Assessment/Plan Breast cancer, no longer needs venous access Port removal local today  Rolm Bookbinder, MD 08/27/2019, 7:11 AM

## 2019-08-27 NOTE — Anesthesia Preprocedure Evaluation (Deleted)
Anesthesia Evaluation  Patient identified by MRN, date of birth, ID band Patient awake    Reviewed: Allergy & Precautions, NPO status , Patient's Chart, lab work & pertinent test results  History of Anesthesia Complications (+) PONV and history of anesthetic complications  Airway        Dental   Pulmonary neg pulmonary ROS, former smoker,           Cardiovascular hypertension, + dysrhythmias   HLD  TTE 06/2019 Normal LVEF, no significant valvular abnormalities  Stress Test 2018 Blood pressure demonstrated a normal response to exercise. There was no ST segment deviation noted during stress. No ischemia Rare PVC with exercise Slight QRS widening during stress on flecainide F/U with Dr Curt Bears  Holter Monitor 2018 <1% PVCs <1% APCs Multiple runs of wide complex tachycardia Short runs of narrow complex tachycardia   Neuro/Psych negative neurological ROS  negative psych ROS   GI/Hepatic Neg liver ROS, GERD  ,  Endo/Other  negative endocrine ROS  Renal/GU negative Renal ROS  negative genitourinary   Musculoskeletal negative musculoskeletal ROS (+)   Abdominal   Peds  Hematology negative hematology ROS (+)   Anesthesia Other Findings   Reproductive/Obstetrics                             Anesthesia Physical Anesthesia Plan  ASA: III  Anesthesia Plan: MAC   Post-op Pain Management:    Induction: Intravenous  PONV Risk Score and Plan: Propofol infusion and Treatment may vary due to age or medical condition  Airway Management Planned: Natural Airway  Additional Equipment:   Intra-op Plan:   Post-operative Plan:   Informed Consent: I have reviewed the patients History and Physical, chart, labs and discussed the procedure including the risks, benefits and alternatives for the proposed anesthesia with the patient or authorized representative who has indicated his/her  understanding and acceptance.     Dental advisory given  Plan Discussed with: CRNA  Anesthesia Plan Comments: (Procedure is local anesthetic.)       Anesthesia Quick Evaluation

## 2019-08-27 NOTE — Op Note (Signed)
Preoperative diagnosis: Breast cancer, no longer needs venous access Postoperative diagnosis: Same as above Procedure: Port removal Surgeon: Dr. Serita Grammes Anesthesia: Local Estimated blood loss: Minimal Complications: None Drains: None Specimens: None Sponge needle count was correct at completion Disposition to recovery in stable condition  Indications: This is a 73 year old female who has completed her therapy for breast cancer.  She had a port in place.  She no longer needs venous access.  We discussed port removal under local.  Procedure: After informed consent was obtained the patient was taken to the operating room.  She was positioned.  She was appropriately padded.  She was prepped and draped in the standard sterile surgical fashion.  A surgical timeout was then performed.  I infiltrated Marcaine and then I reentered her old incision.  I remove the port, line, suture material in their entirety.  Hemostasis was observed.  I closed this with 3-0 Vicryl and 4-0 Monocryl.  Glue was placed.  She tolerated well was transferred to recovery.

## 2019-08-28 ENCOUNTER — Encounter (HOSPITAL_BASED_OUTPATIENT_CLINIC_OR_DEPARTMENT_OTHER): Payer: Self-pay | Admitting: General Surgery

## 2019-08-30 IMAGING — [facility] DIGITAL DIAGNOSTIC UNILATERAL LEFT MAMMOGRAM WITH TOMO AND CAD
6 series · 6 of 18 positions shown · non-contrast
Comparison: Previous exam(s).

CLINICAL DATA: 72-year-old female with recurrent LOWER LEFT
axillary seroma with pain. History of LEFT breast cancer, lumpectomy
and axillary node surgery on 12/05/2018.

EXAM:
DIGITAL DIAGNOSTIC LEFT MAMMOGRAM WITH CAD AND TOMO
ULTRASOUND LEFT BREAST

[L TAN synth-2D]
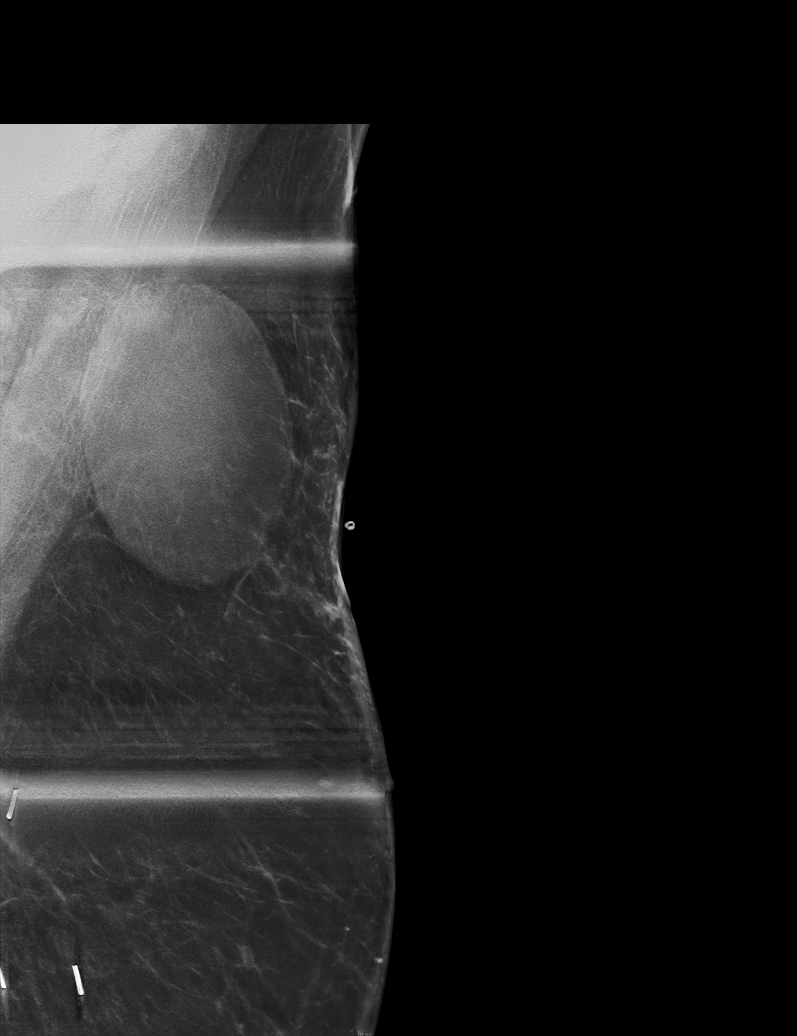

[L CC synth-2D]
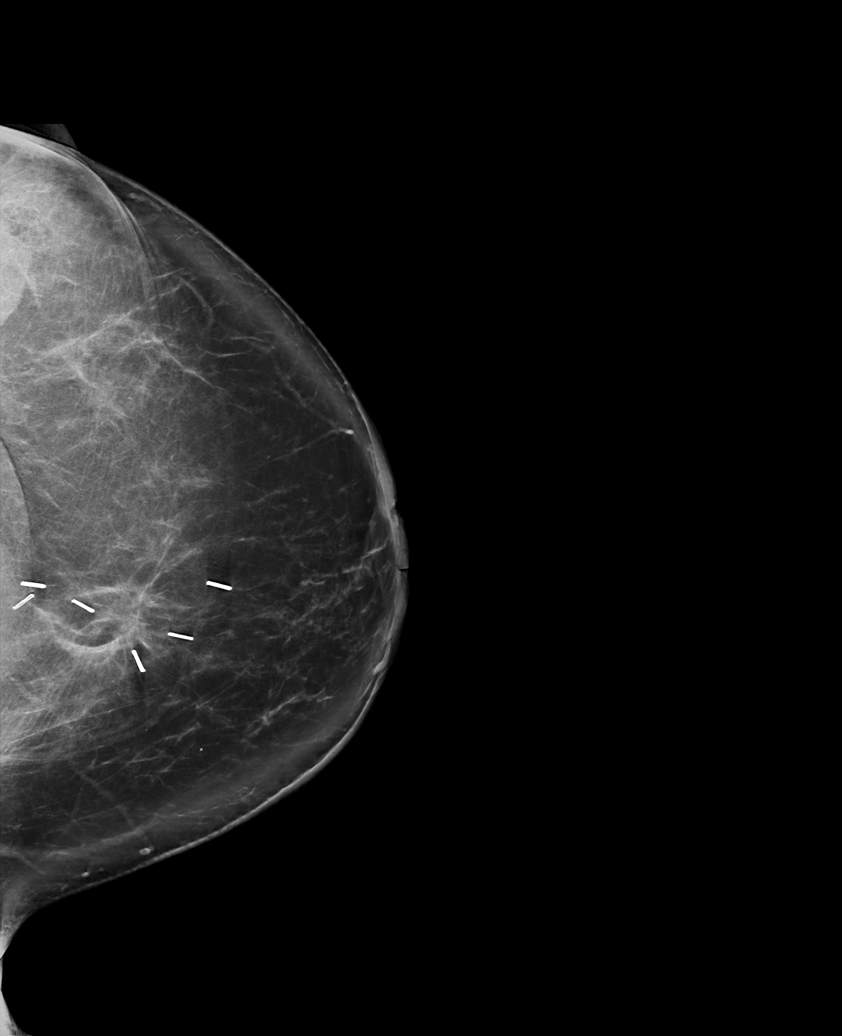

[L MLO synth-2D]
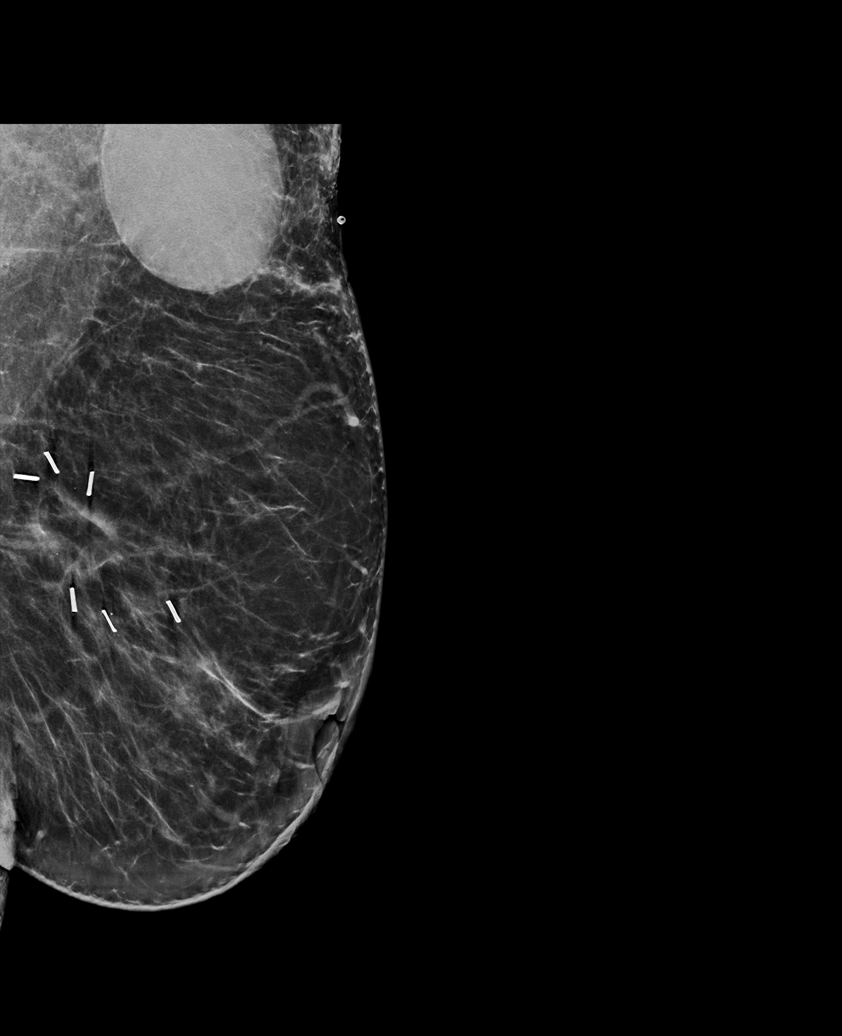

[L MLO tomo · tomo slice 44/87.0]
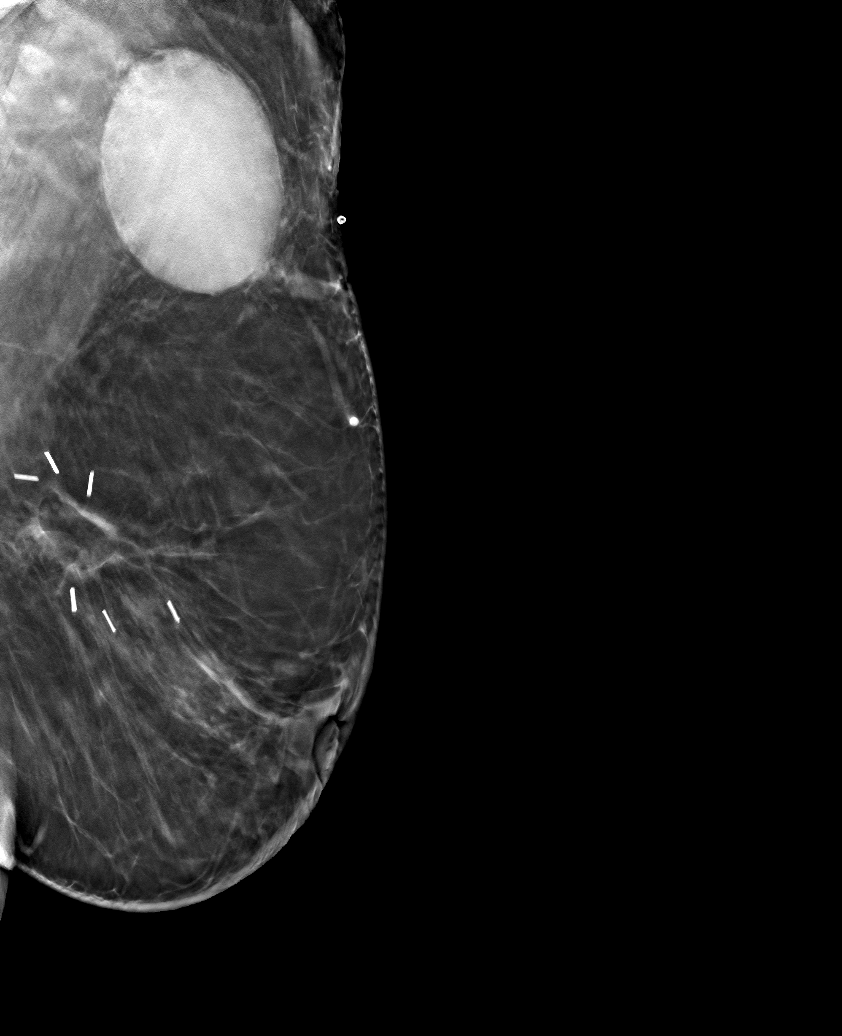

[L CC tomo · tomo slice 49/96.0]
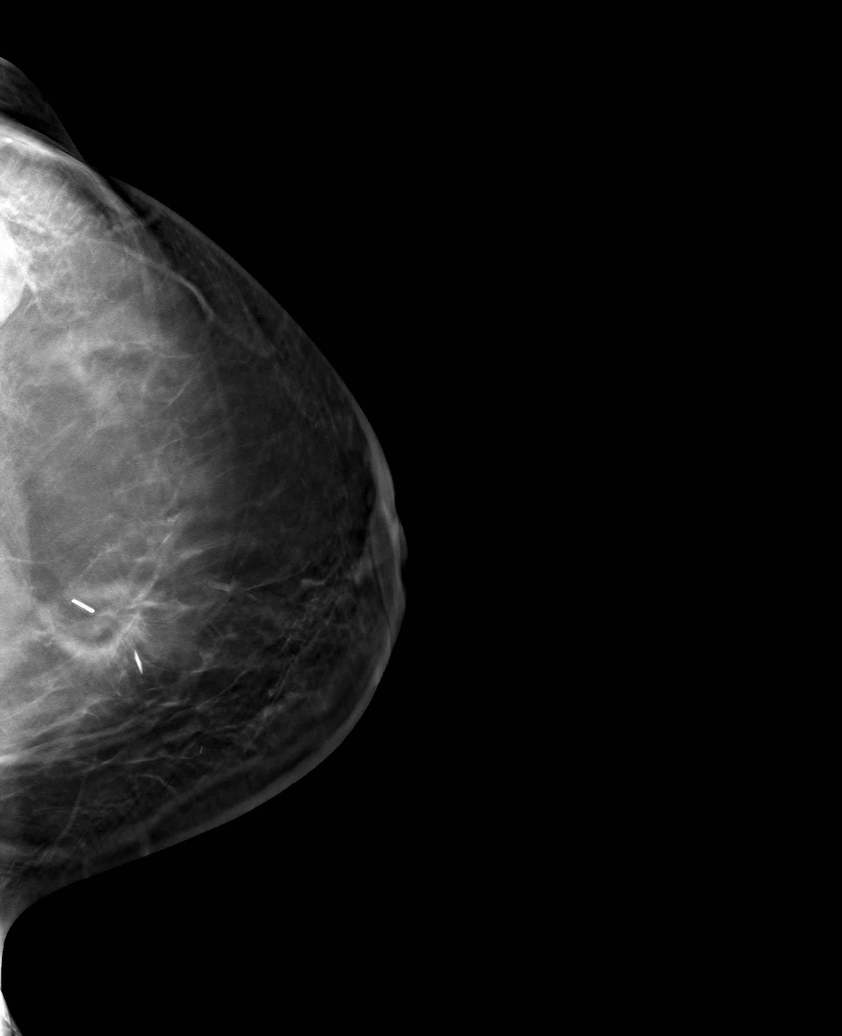

[L TAN tomo · tomo slice 55/109.0]
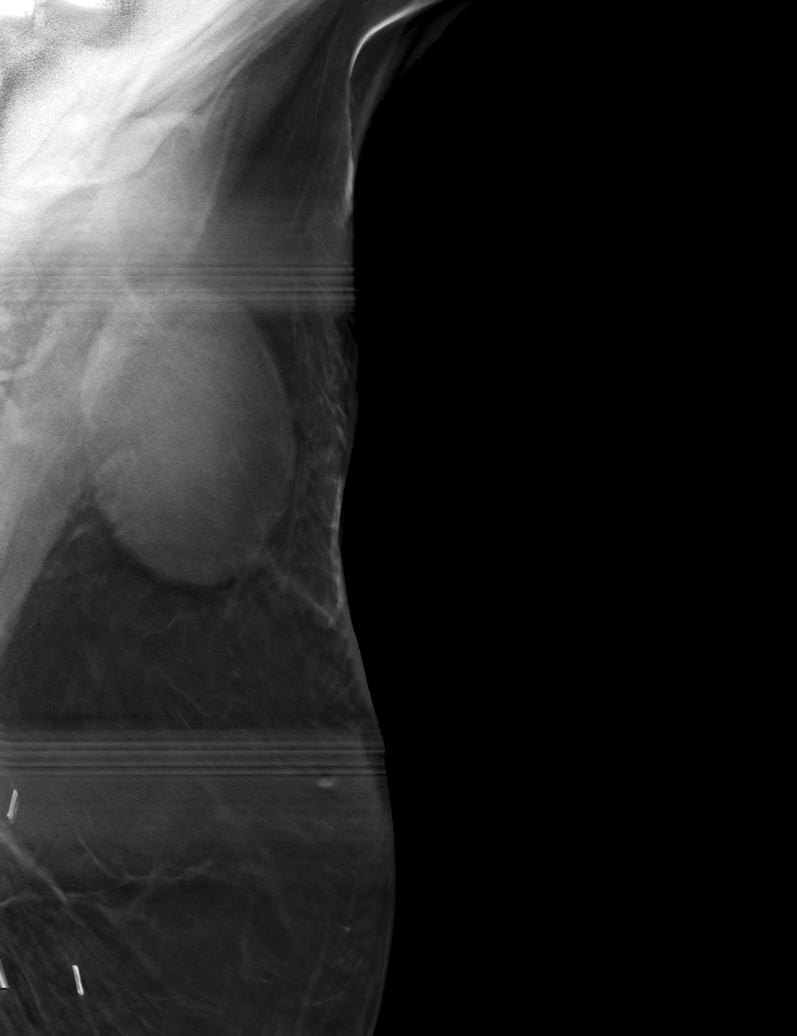

[6 of 18 positions shown; findings below may reference images not displayed]

ACR Breast Density Category b: There are scattered areas of
fibroglandular density.
FINDINGS: 2D/3D full field and spot compression views of the LEFT breast
demonstrates a large circumscribed oval mass in the LOWER LEFT
axilla.

Lumpectomy changes within the UPPER INNER LEFT breast noted.

Mammographic images were processed with CAD.

On physical exam, palpable fullness in the LOWER LEFT axilla noted
with overlying radiation skin changes.

Targeted ultrasound is performed, showing a 6.2 x 3.7 x 4.5 cm
minimally complicated collection in the LOWER LEFT axilla compatible
with a seroma.
IMPRESSION: 1. 6.2 x 3.7 x 4.5 cm recurrent seroma in the LOWER LEFT axilla. Due
to patient's pain, she desires aspiration.
2. LEFT lumpectomy changes.
3. No evidence of LEFT breast malignancy.

RECOMMENDATION:
Ultrasound-guided LEFT breast/axillary seroma aspiration, which will
be performed today but dictated in a separate report.

Bilateral diagnostic mammogram in July 2019 to resume annual
mammogram schedule.

I have discussed the findings and recommendations with the patient.
If applicable, a reminder letter will be sent to the patient
regarding the next appointment.

BI-RADS CATEGORY  2: Benign.

## 2019-09-03 ENCOUNTER — Telehealth: Payer: Self-pay | Admitting: Cardiology

## 2019-09-03 MED ORDER — DILTIAZEM HCL ER COATED BEADS 120 MG PO CP24: ORAL_CAPSULE | ORAL | 0 refills | Status: DC

## 2019-09-03 MED ORDER — FLECAINIDE ACETATE 100 MG PO TABS: 100.0000 mg | ORAL_TABLET | Freq: Two times a day (BID) | ORAL | 0 refills | Status: DC

## 2019-09-03 NOTE — Telephone Encounter (Signed)
Pt's medications were sent to pt's pharmacy as requested. Confirmation received.  

## 2019-09-03 NOTE — Telephone Encounter (Signed)
°*  STAT* If patient is at the pharmacy, call can be transferred to refill team.   1. Which medications need to be refilled? (please list name of each medication and dose if known) Diltiazem, and flecainide  2. Which pharmacy/location (including street and city if local pharmacy) is medication to be sent to?[facility]  3. Do they need a 30 day or 90 day supply? [place]

## 2019-09-04 ENCOUNTER — Other Ambulatory Visit: Payer: Self-pay | Admitting: Cardiology

## 2019-09-04 MED ORDER — FLECAINIDE ACETATE 100 MG PO TABS: 100.0000 mg | ORAL_TABLET | Freq: Two times a day (BID) | ORAL | 0 refills | Status: DC

## 2019-09-04 MED ORDER — DILTIAZEM HCL ER COATED BEADS 120 MG PO CP24: ORAL_CAPSULE | ORAL | 0 refills | Status: DC

## 2019-09-04 NOTE — Telephone Encounter (Signed)
° ° ° °*  STAT* If patient is at the pharmacy, call can be transferred to refill team.   1. Which medications need to be refilled? (please list name of each medication and dose if known)  flecainide (TAMBOCOR) 100 MG tablet diltiazem (CARTIA XT) 120 MG 24 hr capsule  2. Which pharmacy/location (including street and city if local pharmacy) is medication to be sent to? [facility]  3. Do they need a 30 day or 90 day supply? [place]

## 2019-09-04 NOTE — Telephone Encounter (Signed)
Pt's medications were sent to pt's pharmacy as requested. Confirmation received.  

## 2019-09-23 ENCOUNTER — Ambulatory Visit (INDEPENDENT_AMBULATORY_CARE_PROVIDER_SITE_OTHER): Payer: PRIVATE HEALTH INSURANCE | Admitting: Student

## 2019-09-23 ENCOUNTER — Encounter: Payer: Self-pay | Admitting: Student

## 2019-09-23 ENCOUNTER — Other Ambulatory Visit: Payer: Self-pay

## 2019-09-23 VITALS — BP 133/88 | HR 66 | Ht 63.0 in | Wt 163.0 lb

## 2019-09-23 DIAGNOSIS — I10 Essential (primary) hypertension: Secondary | ICD-10-CM | POA: Diagnosis not present

## 2019-09-23 DIAGNOSIS — E785 Hyperlipidemia, unspecified: Secondary | ICD-10-CM | POA: Diagnosis not present

## 2019-09-23 DIAGNOSIS — I472 Ventricular tachycardia, unspecified: Secondary | ICD-10-CM

## 2019-09-23 NOTE — Patient Instructions (Addendum)
Medication Instructions:  none *If you need a refill on your cardiac medications before your next appointment, please call your pharmacy*  Lab Work:TODAY CBC CMET Magnesium If you have labs (blood work) drawn today and your tests are completely normal, you will receive your results only by: Marland Kitchen MyChart Message (if you have MyChart) OR . A paper copy in the mail If you have any lab test that is abnormal or we need to change your treatment, we will call you to review the results.  Testing/Procedures: none  Follow-Up: At [facility], you and your health needs are our priority.  As part of our continuing mission to provide you with exceptional heart care, we have created designated Provider Care Teams.  These Care Teams include your primary Cardiologist (physician) and Advanced Practice Providers (APPs -  Physician Assistants and Nurse Practitioners) who all work together to provide you with the care you need, when you need it.  Your next appointment:   6 months  The format for your next appointment:   Either In Person or Virtual  Provider:   Dr Curt Bears  Other Instructions

## 2019-09-23 NOTE — Progress Notes (Signed)
PCP:  [name], MD Primary Cardiologist: No primary care provider on file. Electrophysiologist: Dr. Rory Percy Shanette Czarniak is a 73 y.o. female with past medical history of VT , HTN, HLD, and breast cancer who presents today for routine electrophysiology followup. They are seen for Dr. Curt Bears.  Since last being seen in our clinic, the patient reports completing treatment for left breast cancer including chemo and a lumpectomy. She was followed in the HF clinic with Herceptin with no evidence of cardiotoxicity.    She does have concerns about mildly elevated liver enzymes and "fatty liver" on [place] in July, and if this could be due to diltiazem.   Past Medical History:  Diagnosis Date  . Atrial tachycardia ([place])   . Breast cancer ([place])    left breast cancer 2019  . 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 Allegra Lai)  . GERD (gastroesophageal reflux disease)   . Glaucoma   . Hyperlipidemia   . Hypertension   . Personal history of chemotherapy   . Personal history of radiation therapy    finished 02/11/19  . PONV (postoperative nausea and vomiting)    did well with lumpectomy and port placement   Past Surgical History:  Procedure Laterality Date  . ABDOMINAL HYSTERECTOMY    . ANKLE SURGERY    . APPENDECTOMY    . BREAST BIOPSY     Left  . BREAST LUMPECTOMY Left 12/05/2018  . BREAST LUMPECTOMY WITH RADIOACTIVE SEED AND SENTINEL LYMPH NODE BIOPSY N/A 12/05/2018   Procedure: LEFT BREAST LUMPECTOMY WITH RADIOACTIVE SEED AND LEFT AXILLARY SENTINEL LYMPH NODE BIOPSY AND BLUE DYE INJECTION;  Surgeon: Rolm Bookbinder, MD;  Location: [facility];  Service: General;  Laterality: N/A;  . CATARACT EXTRACTION W/ INTRAOCULAR LENS IMPLANT Bilateral   . PORT-A-CATH REMOVAL N/A 08/27/2019   Procedure: MINOR REMOVAL PORT-A-CATH;  Surgeon: Rolm Bookbinder, MD;  Location: [facility];  Service: General;  Laterality: N/A;  . PORTACATH  PLACEMENT Right 09/03/2018   Procedure: INSERTION PORT-A-CATH WITH [place];  Surgeon: Rolm Bookbinder, MD;  Location: [facility];  Service: General;  Laterality: Right;  . ROTATOR CUFF REPAIR Right   . TONSILLECTOMY      Current Outpatient Medications  Medication Sig Dispense Refill  . acetaminophen (TYLENOL) 500 MG tablet Take 1,000 mg by mouth daily as needed for moderate pain or headache.     Marland Kitchen atorvastatin (LIPITOR) 20 MG tablet Take 20 mg by mouth daily.    . cetirizine (ZYRTEC) 10 MG tablet Take 10 mg by mouth daily.    . Cholecalciferol (VITAMIN D3) 125 MCG (5000 UT) CAPS Take 5,000 Units by mouth at bedtime.     . Coenzyme Q10 10 MG capsule Take 10 mg by mouth at bedtime.    Marland Kitchen dexlansoprazole (DEXILANT) 60 MG capsule Take 60 mg by mouth daily.    Marland Kitchen diltiazem (CARTIA XT) 120 MG 24 hr capsule TAKE ONE CAPSULE BY MOUTH DAILY. Please keep upcoming appt in December for future refills. Thank you 92 capsule 0  . flecainide (TAMBOCOR) 100 MG tablet Take 1 tablet (100 mg total) by mouth 2 (two) times daily. Please keep upcoming appt in December for future refills. Thank you 180 tablet 0  . fluticasone (FLONASE) 50 MCG/ACT nasal spray Place 1 spray into both nostrils daily as needed for allergies.     . furosemide (LASIX) 20 MG tablet TAKE 1 TABLET(20 MG) BY MOUTH DAILY 30 tablet  0  . Krill Oil 500 MG CAPS Take 500 mg by mouth at bedtime.    . lidocaine-prilocaine (EMLA) cream Apply to affected area once 30 g 3  . montelukast (SINGULAIR) 10 MG tablet Take 10 mg by mouth daily.    . Multiple Vitamin (MULTIVITAMIN) tablet Take 1 tablet by mouth at bedtime.     Vladimir Faster Glycol-Propyl Glycol (SYSTANE OP) Place 1 drop into both eyes 2 (two) times daily as needed (dry eyes).    . quinapril (ACCUPRIL) 20 MG tablet Take 1 tablet (20 mg total) by mouth daily. Please make overdue appt with Dr. Johnsie Cancel before anymore refills. 3rd and Final attempt 15 tablet 0  . brimonidine-timolol (COMBIGAN) 0.2-0.5 %  ophthalmic solution Place 1 drop into both eyes every 12 (twelve) hours.     No current facility-administered medications for this visit.    Facility-Administered Medications Ordered in Other Visits  Medication Dose Route Frequency Provider Last Rate Last Dose  . sodium chloride flush (NS) 0.9 % injection 10 mL  10 mL Intracatheter Once Nicholas Lose, MD        Allergies  Allergen Reactions  . Adhesive [Tape] Other (See Comments)    Pulls skin off  . Oxycodone Hcl Hives  . Augmentin [Amoxicillin-Pot Clavulanate] Diarrhea    Has patient had a PCN reaction causing immediate rash, facial/tongue/throat swelling, SOB or lightheadedness with hypotension: No Has patient had a PCN reaction causing severe rash involving mucus membranes or skin necrosis: No Has patient had a PCN reaction that required hospitalization: No Has patient had a PCN reaction occurring within the last 10 years: Yes If all of the above answers are "NO", then may proceed with Cephalosporin use.   Marland Kitchen Xylocaine [Lidocaine Hcl] Palpitations    For Dental Procedures     Social History   Socioeconomic History  . Marital status: Married    Spouse name: Not on file  . Number of children: Not on file  . Years of education: Not on file  . Highest education level: Not on file  Occupational History  . Not on file  Social Needs  . Financial resource strain: Not on file  . Food insecurity    Worry: Not on file    Inability: Not on file  . Transportation needs    Medical: No    Non-medical: No  Tobacco Use  . Smoking status: Former Research scientist (life sciences)  . Smokeless tobacco: Never Used  Substance and Sexual Activity  . Alcohol use: Yes    Comment: social  . Drug use: No  . Sexual activity: Not on file  Lifestyle  . Physical activity    Days per week: Not on file    Minutes per session: Not on file  . Stress: Not on file  Relationships  . Social Herbalist on phone: Not on file    Gets together: Not on file     Attends religious service: Not on file    Active member of club or organization: Not on file    Attends meetings of clubs or organizations: Not on file    Relationship status: Not on file  . Intimate partner violence    Fear of current or ex partner: Not on file    Emotionally abused: Not on file    Physically abused: Not on file    Forced sexual activity: Not on file  Other Topics Concern  . Not on file  Social History Narrative  . Not on  file    Review of Systems: General: No chills, fever, night sweats or weight changes  Cardiovascular:  No chest pain, dyspnea on exertion, edema, orthopnea, palpitations, paroxysmal nocturnal dyspnea Dermatological: No rash, lesions or masses Respiratory: No cough, dyspnea Urologic: No hematuria, dysuria Abdominal: No nausea, vomiting, diarrhea, bright red blood per rectum, melena, or hematemesis Neurologic: No visual changes, weakness, changes in mental status All other systems reviewed and are otherwise negative except as noted above.  Physical Exam: Vitals:   09/23/19 1301  BP: 133/88  Pulse: 66  SpO2: 98%  Weight: 163 lb (73.9 kg)  Height: 5\' 3"  (1.6 m)    GEN- The patient is well appearing, alert and oriented x 3 today.   HEENT: normocephalic, atraumatic; sclera clear, conjunctiva pink; hearing intact; oropharynx clear; neck supple, no JVP Lymph- no cervical lymphadenopathy Lungs- Clear to ausculation bilaterally, normal work of breathing.  No wheezes, rales, rhonchi Heart- Regular rate and rhythm, no murmurs, rubs or gallops, PMI not laterally displaced GI- soft, non-tender, non-distended, bowel sounds present, no hepatosplenomegaly Extremities- no clubbing, cyanosis, or edema; DP/PT/radial pulses 2+ bilaterally MS- no significant deformity or atrophy Skin- warm and dry, no rash or lesion Psych- euthymic mood, full affect Neuro- strength and sensation are intact  EKG is ordered. Personal review of EKG from today shows NSR at 66  bpm  Assessment and Plan:  1. Ventricular tachycardia Echo 06/2019 showed LVEF 60-65% No further episodes on flecainide. Continue 100 mg BID COntinue diltiazem 120 mg daily  2. HTN No changes to current regimen  3. HLD Continue statin  4. Elevated Transaminates She has had mild elevation chronically and is worried it is from diltiazem.  Will recheck today, and discuss alternatives if elevated. She has previously not tolerated lopressor well due to fatigue.   RTC 6 months to see Dr. Curt Bears for continued flecainide monitoring.   Shirley Friar, PA-C  09/23/19 1:05 PM

## 2019-09-24 LAB — CBC
Hematocrit: 42.5 % (ref 34.0–46.6)
Hemoglobin: 15.1 g/dL (ref 11.1–15.9)
MCH: 35.5 pg — ABNORMAL HIGH (ref 26.6–33.0)
MCHC: 35.5 g/dL (ref 31.5–35.7)
MCV: 100 fL — ABNORMAL HIGH (ref 79–97)
Platelets: 152 10*3/uL (ref 150–450)
RBC: 4.25 x10E6/uL (ref 3.77–5.28)
RDW: 12.2 % (ref 11.7–15.4)
WBC: 6.5 10*3/uL (ref 3.4–10.8)

## 2019-09-24 LAB — COMPREHENSIVE METABOLIC PANEL
ALT: 109 IU/L — ABNORMAL HIGH (ref 0–32)
AST: 74 IU/L — ABNORMAL HIGH (ref 0–40)
Albumin/Globulin Ratio: 2 (ref 1.2–2.2)
Albumin: 4.2 g/dL (ref 3.7–4.7)
Alkaline Phosphatase: 116 IU/L (ref 39–117)
BUN/Creatinine Ratio: 20 (ref 12–28)
BUN: 17 mg/dL (ref 8–27)
Bilirubin Total: 0.5 mg/dL (ref 0.0–1.2)
CO2: 26 mmol/L (ref 20–29)
Calcium: 9.3 mg/dL (ref 8.7–10.3)
Chloride: 101 mmol/L (ref 96–106)
Creatinine, Ser: 0.83 mg/dL (ref 0.57–1.00)
GFR calc Af Amer: 82 mL/min/{1.73_m2} (ref >59)
GFR calc non Af Amer: 71 mL/min/{1.73_m2} (ref >59)
Globulin, Total: 2.1 g/dL (ref 1.5–4.5)
Glucose: 136 mg/dL — ABNORMAL HIGH (ref 65–99)
Potassium: 4.2 mmol/L (ref 3.5–5.2)
Sodium: 141 mmol/L (ref 134–144)
Total Protein: 6.3 g/dL (ref 6.0–8.5)

## 2019-09-24 LAB — MAGNESIUM: Magnesium: 2 mg/dL (ref 1.6–2.3)

## 2019-10-03 ENCOUNTER — Other Ambulatory Visit: Payer: Self-pay | Admitting: Cardiology

## 2019-11-02 ENCOUNTER — Other Ambulatory Visit: Payer: Self-pay | Admitting: Cardiology

## 2019-11-04 ENCOUNTER — Other Ambulatory Visit: Payer: Self-pay | Admitting: Cardiology

## 2019-11-04 MED ORDER — DILTIAZEM HCL ER COATED BEADS 120 MG PO CP24: ORAL_CAPSULE | ORAL | 3 refills | Status: DC

## 2019-11-12 ENCOUNTER — Other Ambulatory Visit: Payer: Self-pay

## 2019-11-12 ENCOUNTER — Inpatient Hospital Stay: Payer: PRIVATE HEALTH INSURANCE | Attending: Hematology and Oncology | Admitting: Adult Health

## 2019-11-12 VITALS — BP 134/82 | HR 72 | Temp 97.6°F | Resp 76 | Ht 63.0 in | Wt 164.2 lb

## 2019-11-12 DIAGNOSIS — I10 Essential (primary) hypertension: Secondary | ICD-10-CM | POA: Insufficient documentation

## 2019-11-12 DIAGNOSIS — C50212 Malignant neoplasm of upper-inner quadrant of left female breast: Secondary | ICD-10-CM

## 2019-11-12 DIAGNOSIS — Z9221 Personal history of antineoplastic chemotherapy: Secondary | ICD-10-CM | POA: Diagnosis not present

## 2019-11-12 DIAGNOSIS — Z87891 Personal history of nicotine dependence: Secondary | ICD-10-CM | POA: Diagnosis not present

## 2019-11-12 DIAGNOSIS — Z171 Estrogen receptor negative status [ER-]: Secondary | ICD-10-CM | POA: Diagnosis not present

## 2019-11-12 DIAGNOSIS — Z90710 Acquired absence of both cervix and uterus: Secondary | ICD-10-CM | POA: Diagnosis not present

## 2019-11-12 DIAGNOSIS — Z923 Personal history of irradiation: Secondary | ICD-10-CM | POA: Diagnosis not present

## 2019-11-12 DIAGNOSIS — Z853 Personal history of malignant neoplasm of breast: Secondary | ICD-10-CM | POA: Insufficient documentation

## 2019-11-12 DIAGNOSIS — N6489 Other specified disorders of breast: Secondary | ICD-10-CM | POA: Insufficient documentation

## 2019-11-12 NOTE — Progress Notes (Signed)
SURVIVORSHIP VISIT:  BRIEF ONCOLOGIC HISTORY:  Oncology History  Malignant neoplasm of upper-inner quadrant of left breast in female, estrogen receptor negative ([place])  08/17/2018 Initial Diagnosis   Screening mammogram detected abnormality in the left breast by ultrasound irregular mass 10:00 position 1.3 cm, 10 o'clock position no enlarged lymph nodes, Biopsy ([id number]) revealed: IMC, grade 3; ER 0%, PR 0%, Ki-67 80%, HER-2 positive 3+ by [facility]   08/28/2018 Cancer Staging   Staging form: Breast, AJCC 8th Edition - Clinical stage from 08/28/2018: Stage IA (cT1c, cN0, cM0, G3, ER-, PR-, HER2+) - Signed by Nicholas Lose, MD on 08/28/2018   08/28/2018 Breast MRI   malignancy within the UPPER INNER LEFT breast with dominant mass measuring 2.1 cm. With small adjacent satellite nodules, the entire area 1.5 x 3 x 2.2 cm.No evidence of abnormal lymph nodes or RIGHT breast malignancy.   09/03/2018 -  Chemotherapy   Weekly Taxol and Herceptin followed by maintenance Herceptin for one year.  EF: 60-65% on 06/21/2019   12/05/2018 Surgery   Left lumpectomy Donne Hazel) [phone]): No residual invasive carcinoma, margins negative, 0/2 lymph nodes negative, complete pathologic response [id number]   01/15/2019 - 02/11/2019 Radiation Therapy   The patient initially received a dose of 42.56 Gy in 16 fractions to the left breast using whole-breast tangent fields. This was delivered using a 3-D conformal technique. The patient then received a boost to the seroma. This delivered an additional 8 Gy in 4 fractions using a 3 field photon technique due to the depth of the seroma. The total dose was 50.56 Gy.     INTERVAL HISTORY:  Ms. Villela to review her survivorship care plan detailing her treatment course for breast cancer, as well as monitoring long-term side effects of that treatment, education regarding health maintenance, screening, and overall wellness and health promotion.     Overall, Ms. Thull reports  feeling quite well.  She notes that in October she underwent a mammogram and ultrasound for some fullness that identified a seroma.  Her breast has improved, but is not 100% better.  She has repeat mammogram and ultrasound scheduled for 01/2020.  REVIEW OF SYSTEMS:  Review of Systems  Constitutional: Negative for appetite change, chills, fatigue, fever and unexpected weight change.  HENT:   Negative for hearing loss, lump/mass, sore throat and trouble swallowing.   Eyes: Negative for eye problems and icterus.  Respiratory: Negative for chest tightness, cough and shortness of breath.   Cardiovascular: Negative for chest pain, leg swelling and palpitations.  Gastrointestinal: Negative for abdominal distention, abdominal pain, constipation, diarrhea, nausea and vomiting.  Endocrine: Negative for hot flashes.  Genitourinary: Negative for difficulty urinating.   Musculoskeletal: Negative for arthralgias and back pain.  Skin: Negative for itching and rash.  Neurological: Negative for dizziness, extremity weakness, headaches and numbness.  Hematological: Negative for adenopathy. Does not bruise/bleed easily.  Psychiatric/Behavioral: Negative for depression. The patient is not nervous/anxious.        ONCOLOGY TREATMENT TEAM:  1. Surgeon:  Dr. Donne Hazel at [facility] 2. Medical Oncologist: Dr. Lindi Adie  3. Radiation Oncologist: Dr. Lisbeth Renshaw    PAST MEDICAL/SURGICAL HISTORY:  Past Medical History:  Diagnosis Date  . Atrial tachycardia ([place])   . Breast cancer ([place])    left breast cancer 2019  . 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 Allegra Lai)  . GERD (gastroesophageal reflux disease)   . Glaucoma   . Hyperlipidemia   . Hypertension   .  Personal history of chemotherapy   . Personal history of radiation therapy    finished 02/11/19  . PONV (postoperative nausea and vomiting)    did well with lumpectomy and port placement   Past Surgical  History:  Procedure Laterality Date  . ABDOMINAL HYSTERECTOMY    . ANKLE SURGERY    . APPENDECTOMY    . BREAST BIOPSY     Left  . BREAST LUMPECTOMY Left 12/05/2018  . BREAST LUMPECTOMY WITH RADIOACTIVE SEED AND SENTINEL LYMPH NODE BIOPSY N/A 12/05/2018   Procedure: LEFT BREAST LUMPECTOMY WITH RADIOACTIVE SEED AND LEFT AXILLARY SENTINEL LYMPH NODE BIOPSY AND BLUE DYE INJECTION;  Surgeon: Rolm Bookbinder, MD;  Location: [place];  Service: General;  Laterality: N/A;  . CATARACT EXTRACTION W/ INTRAOCULAR LENS IMPLANT Bilateral   . PORT-A-CATH REMOVAL N/A 08/27/2019   Procedure: MINOR REMOVAL PORT-A-CATH;  Surgeon: Rolm Bookbinder, MD;  Location: [place];  Service: General;  Laterality: N/A;  . PORTACATH PLACEMENT Right 09/03/2018   Procedure: INSERTION PORT-A-CATH WITH [place];  Surgeon: Rolm Bookbinder, MD;  Location: [place];  Service: General;  Laterality: Right;  . ROTATOR CUFF REPAIR Right   . TONSILLECTOMY       ALLERGIES:  Allergies  Allergen Reactions  . Adhesive [Tape] Other (See Comments)    Pulls skin off  . Oxycodone Hcl Hives  . Augmentin [Amoxicillin-Pot Clavulanate] Diarrhea    Has patient had a PCN reaction causing immediate rash, facial/tongue/throat swelling, SOB or lightheadedness with hypotension: No Has patient had a PCN reaction causing severe rash involving mucus membranes or skin necrosis: No Has patient had a PCN reaction that required hospitalization: No Has patient had a PCN reaction occurring within the last 10 years: Yes If all of the above answers are "NO", then may proceed with Cephalosporin use.   . Xylocaine [Lidocaine Hcl] Palpitations    For Dental Procedures      CURRENT MEDICATIONS:  Outpatient Encounter Medications as of 11/12/2019  Medication Sig  . acetaminophen (TYLENOL) 500 MG tablet Take 1,000 mg by mouth daily as needed for moderate pain or headache.   Marland Kitchen atorvastatin (LIPITOR) 20 MG tablet Take 20 mg by mouth daily.   . cetirizine (ZYRTEC) 10 MG tablet Take 10 mg by mouth daily.  . Cholecalciferol (VITAMIN D3) 125 MCG (5000 UT) CAPS Take 5,000 Units by mouth at bedtime.   . Coenzyme Q10 10 MG capsule Take 10 mg by mouth at bedtime.  Marland Kitchen dexlansoprazole (DEXILANT) 60 MG capsule Take 60 mg by mouth daily.  Marland Kitchen diltiazem (CARDIZEM CD) 120 MG 24 hr capsule TAKE 1 CAPSULE BY MOUTH  DAILY  . flecainide (TAMBOCOR) 100 MG tablet TAKE 1 TABLET BY MOUTH  TWICE DAILY  . fluticasone (FLONASE) 50 MCG/ACT nasal spray Place 1 spray into both nostrils daily as needed for allergies.   . furosemide (LASIX) 20 MG tablet TAKE 1 TABLET(20 MG) BY MOUTH DAILY  . Krill Oil 500 MG CAPS Take 500 mg by mouth at bedtime.  . montelukast (SINGULAIR) 10 MG tablet Take 10 mg by mouth daily.  . Multiple Vitamin (MULTIVITAMIN) tablet Take 1 tablet by mouth at bedtime.   Vladimir Faster Glycol-Propyl Glycol (SYSTANE OP) Place 1 drop into both eyes 2 (two) times daily as needed (dry eyes).  . quinapril (ACCUPRIL) 20 MG tablet Take 1 tablet (20 mg total) by mouth daily. Please make overdue appt with Dr. Johnsie Cancel before anymore refills. 3rd and Final attempt  . brimonidine-timolol (COMBIGAN) 0.2-0.5 % ophthalmic solution  Place 1 drop into both eyes every 12 (twelve) hours.  . [DISCONTINUED] diltiazem (CARDIZEM CD) 120 MG 24 hr capsule TAKE 1 CAPSULE BY MOUTH  DAILY  . [DISCONTINUED] lidocaine-prilocaine (EMLA) cream Apply to affected area once (Patient not taking: Reported on 11/12/2019)   Facility-Administered Encounter Medications as of 11/12/2019  Medication  . sodium chloride flush (NS) 0.9 % injection 10 mL     ONCOLOGIC FAMILY HISTORY:  Family History  Problem Relation Age of Onset  . Hypertension Mother   . Breast cancer Neg Hx      GENETIC COUNSELING/TESTING: Not at this time  SOCIAL HISTORY:  Social History   Socioeconomic History  . Marital status: Married    Spouse name: Not on file  . Number of children: Not on file  . Years  of education: Not on file  . Highest education level: Not on file  Occupational History  . Not on file  Tobacco Use  . Smoking status: Former Research scientist (life sciences)  . Smokeless tobacco: Never Used  Substance and Sexual Activity  . Alcohol use: Yes    Comment: social  . Drug use: No  . Sexual activity: Not on file  Other Topics Concern  . Not on file  Social History Narrative  . Not on file   Social Determinants of Health   Financial Resource Strain:   . Difficulty of Paying Living Expenses: Not on file  Food Insecurity:   . Worried About [name] fundraiser in the Last Year: Not on file  . Ran Out of Food in the Last Year: Not on file  Transportation Needs: No Transportation Needs  . Lack of Transportation (Medical): No  . Lack of Transportation (Non-Medical): No  Physical Activity:   . Days of Exercise per Week: Not on file  . Minutes of Exercise per Session: Not on file  Stress:   . Feeling of Stress : Not on file  Social Connections:   . Frequency of Communication with Friends and Family: Not on file  . Frequency of Social Gatherings with Friends and Family: Not on file  . Attends Religious Services: Not on file  . Active Member of Clubs or Organizations: Not on file  . Attends Archivist Meetings: Not on file  . Marital Status: Not on file  Intimate Partner Violence:   . Fear of Current or Ex-Partner: Not on file  . Emotionally Abused: Not on file  . Physically Abused: Not on file  . Sexually Abused: Not on file     OBSERVATIONS/OBJECTIVE:  BP 134/82 (BP Location: Right Arm, Patient Position: Sitting)   Pulse 72   Temp 97.6 F (36.4 C) (Temporal)   Resp (!) 76   Ht '5\' 3"'  (1.6 m)   Wt 164 lb 3.2 oz (74.5 kg)   SpO2 99%   BMI 29.09 kg/m  GENERAL: Patient is a well appearing female in no acute distress HEENT:  Sclerae anicteric.  Oropharynx clear and moist. No ulcerations or evidence of oropharyngeal candidiasis. Neck is supple.  NODES:  No cervical,  supraclavicular, or axillary lymphadenopathy palpated.  BREAST EXAM:  Left breast s/p lumpectomy/radiation, no sign of local recurrence, mild amt of swelling, right breast benign LUNGS:  Clear to auscultation bilaterally.  No wheezes or rhonchi. HEART:  Regular rate and rhythm. No murmur appreciated. ABDOMEN:  Soft, nontender.  Positive, normoactive bowel sounds. No organomegaly palpated. MSK:  No focal spinal tenderness to palpation. Full range of motion bilaterally in the upper extremities. EXTREMITIES:  No peripheral edema.   SKIN:  Clear with no obvious rashes or skin changes. No nail dyscrasia. NEURO:  Nonfocal. Well oriented.  Appropriate affect.    LABORATORY DATA:  None for this visit.  DIAGNOSTIC IMAGING:   CLINICAL DATA:  74 year old female presenting for routine annual surveillance status post left breast lumpectomy in 2019. She was diagnosed with grade 3 invasive mammary carcinoma of the left breast with lymphovascular invasion.The patient states that she has had increased fullness and tenderness at her axillary lymph node dissection site, where she has a known seroma that was drained in May of 2020.  EXAM: DIGITAL DIAGNOSTIC BILATERAL MAMMOGRAM WITH CAD AND TOMO  ULTRASOUND LEFT AXILLA  COMPARISON:  Previous exam(s).  ACR Breast Density Category b: There are scattered areas of fibroglandular density.  FINDINGS: On the initial full paddle MLO image of the right breast, there is a questionable area of distortion along the anterior margin of the seroma. Spot compression tomosynthesis images over this site demonstrates that the appearance is compatible with postsurgical changes and this area of presumed scarring extends to the skin surface. The seroma has decreased in size, now measuring approximately 4.0 cm, previously about 6 cm prior to the aspiration which was performed in May of 2020. No other suspicious calcifications, masses or areas of distortion are  seen in the bilateral breasts.  Mammographic images were processed with CAD.  Ultrasound of the left axilla demonstrates a persistent seroma measuring approximately 4 cm. An area of echogenic surgical scarring is seen along the anterior aspect of the seroma extending to the skin surface, likely corresponding with the asymmetry seen mammographically. No suspicious masses are identified.  IMPRESSION: 1. The asymmetry along the anterior aspect of the seroma is slightly more prominent than seen on the mammogram from 02/19/2019. This is likely due to progressive healing from the axillary lymph node dissection.  2. Decreased in size of the left axillary seroma from the prior mammogram (02/19/2019), but increased in size from the time of aspiration (02/28/2019).  3. No suspicious findings are seen at the lumpectomy site in the central to medial left breast.  4.  No mammographic evidence of malignancy in the bilateral breasts.  RECOMMENDATION: 1. Six-month follow-up left breast mammogram and possible ultrasound is recommended.  I have discussed the findings and recommendations with the patient. If applicable, a reminder letter will be sent to the patient regarding the next appointment.  BI-RADS CATEGORY  3: Probably benign.   Electronically Signed   By: Ammie Ferrier M.D.   On: 08/01/2019 09:33     ASSESSMENT AND PLAN:  Ms.. Ell is a pleasant 74 y.o. female with Stage IA left breast invasive ductal carcinoma, ER-/PR-/HER2+, diagnosed in 08/2018, treated with neoadjuvant chemotherapy, lumpectomy, adjuvant radiation therapy, and maintenance Herceptin.  She presents to the Survivorship Clinic for our initial meeting and routine follow-up post-completion of treatment for breast cancer.    1. Stage IA left breast cancer:  Ms. Winburn is continuing to recover from definitive treatment for breast cancer. She will follow-up with her medical oncologist, Dr. Lindi Adie in  01/2020 with history and physical exam per surveillance protocol.  Her mammogram is due 01/2020. Today, a comprehensive survivorship care plan and treatment summary was reviewed with the patient today detailing her breast cancer diagnosis, treatment course, potential late/long-term effects of treatment, appropriate follow-up care with recommendations for the future, and patient education resources.  A copy of this summary, along with a letter will be sent to the patient's  primary care provider via mail/fax/In Basket message after today's visit.    2. Left breast seroma: She will undergo repeat mammogram and ultrasound in 01/2020 to follow this up.  I suggested she massage her breast with vitamin e oil or coconut oil.    3. Bone health:  Given Ms. Viveros's age/history of breast cancer, she is at risk for bone demineralization.  Her last DEXA scan was 2-5 years ago, she can't recall, she plans on f/u with her PCP about this when she sees him next month.  She was given education on specific activities to promote bone health.  4. Cancer screening:  Due to Ms. Claude's history and her age, she should receive screening for skin cancers, colon cancer.  The information and recommendations are listed on the patient's comprehensive care plan/treatment summary and were reviewed in detail with the patient.    5. Health maintenance and wellness promotion: Ms. Lupinacci was encouraged to consume 5-7 servings of fruits and vegetables per day. We reviewed the "Nutrition Rainbow" handout, as well as the handout "Take Control of Your Health and Reduce Your Cancer Risk" from the [place].  She was also encouraged to engage in moderate to vigorous exercise for 30 minutes per day most days of the week. We discussed the [employer] fitness program, which is designed for cancer survivors to help them become more physically fit after cancer treatments.  She was instructed to limit her alcohol consumption and  continue to abstain from tobacco use.     6. Support services/counseling: It is not uncommon for this period of the patient's cancer care trajectory to be one of many emotions and stressors.  We discussed how this can be increasingly difficult during the times of quarantine and social distancing due to the COVID-19 pandemic.   She was given information regarding our available services and encouraged to contact me with any questions or for help enrolling in any of our support group/programs.    Follow up instructions:    -Return to cancer center 01/2020 for f/u with Dr. Lindi Adie  -Mammogram due in 01/2020 -Follow up with surgery 07/2020 -She is welcome to return back to the Survivorship Clinic at any time; no additional follow-up needed at this time.  -Consider referral back to survivorship as a long-term survivor for continued surveillance  She was recommended to continue with the appropriate pandemic precautions.  She knows to call for any questions that may arise between now and her next appointment.  We are happy to see her sooner if needed.  Total time this encounter: 45 minutes   Scot Dock, NP

## 2019-11-22 IMAGING — US ULTRASOUND ABDOMEN COMPLETE
1 series · 14 of 25 positions shown · non-contrast
Comparison: None

CLINICAL DATA: Elevated LFTs, history breast cancer, hypertension

EXAM:
ABDOMEN ULTRASOUND COMPLETE

[Series 1: ultrasound abdomen complete · 14 of 62 slices shown]
[im 1/62]
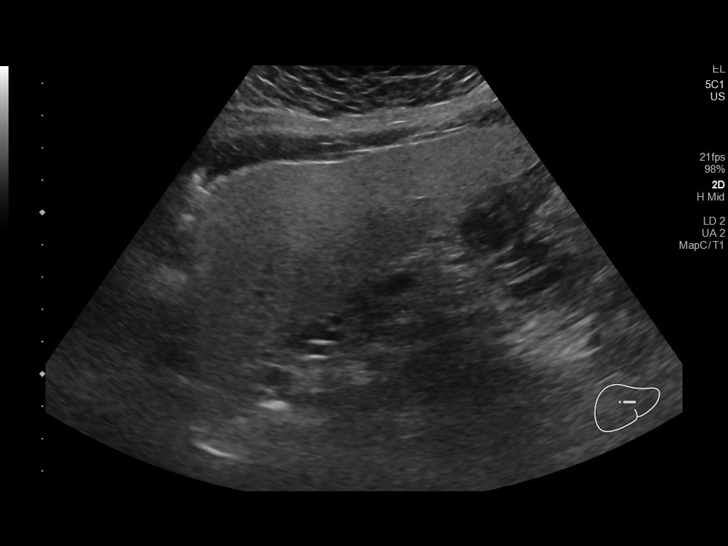
[im 6/62]
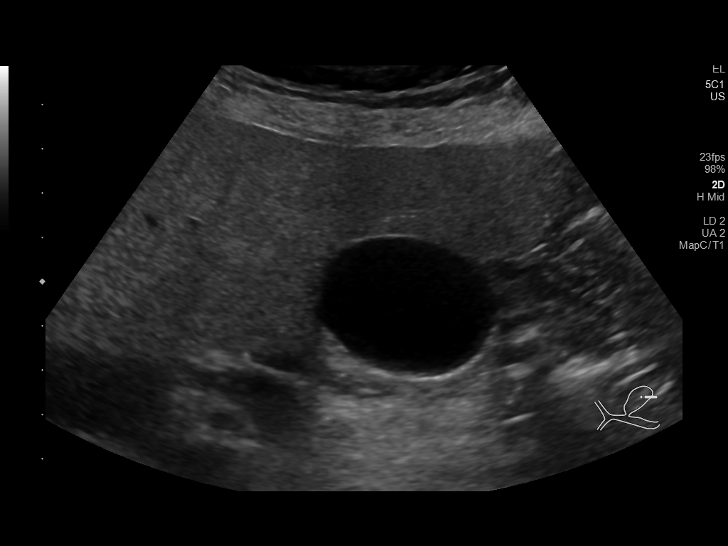
[im 11/62]
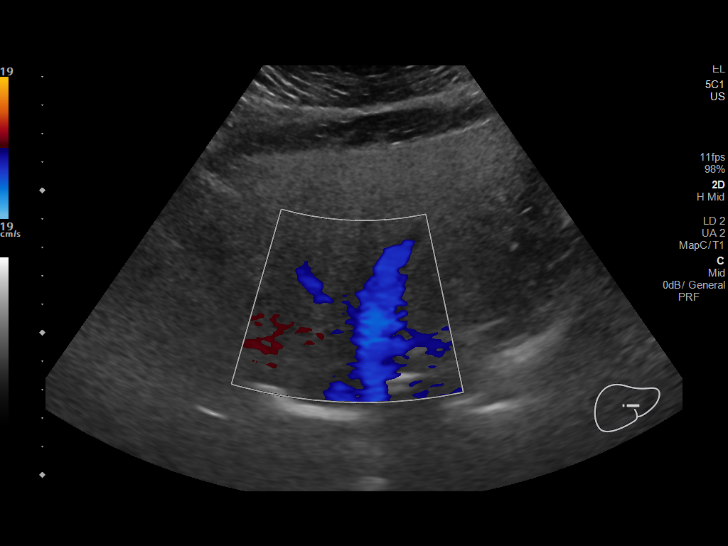
[im 16/62]
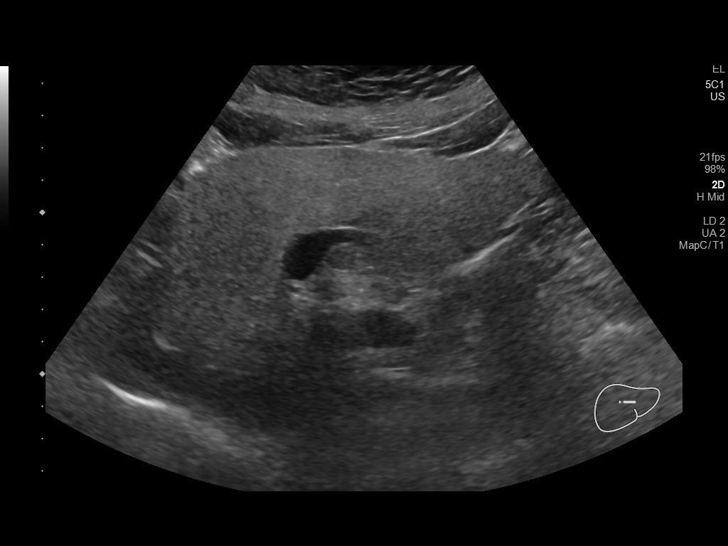
[im 21/62]
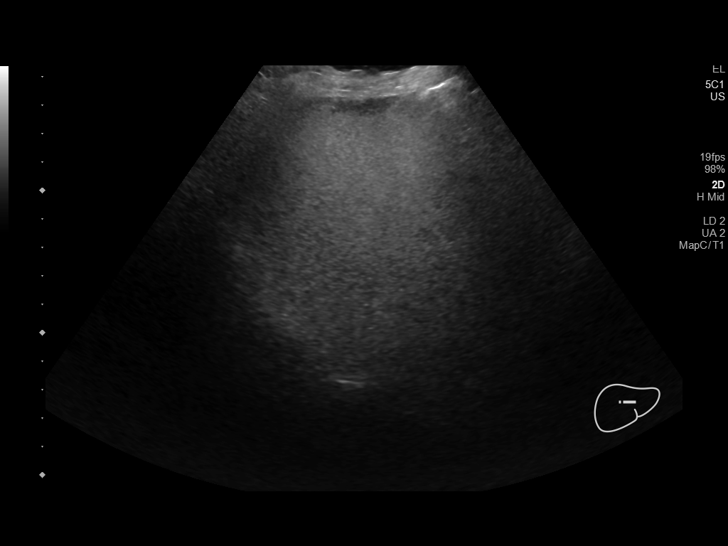
[im 23/62]
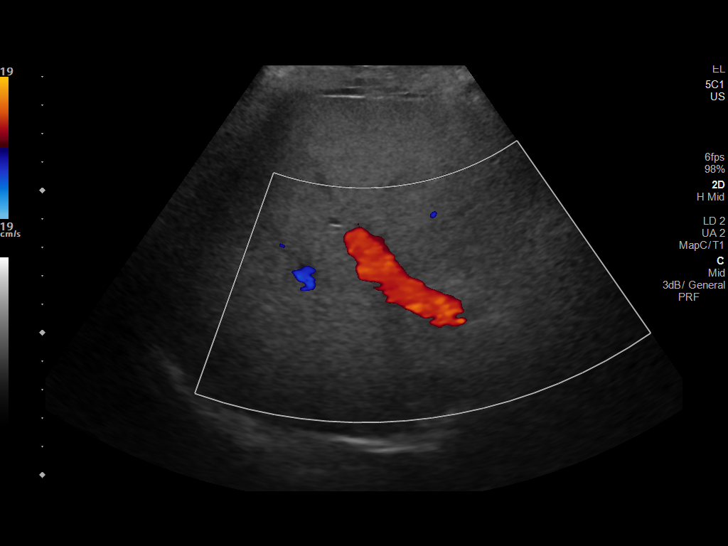
[im 28/62]
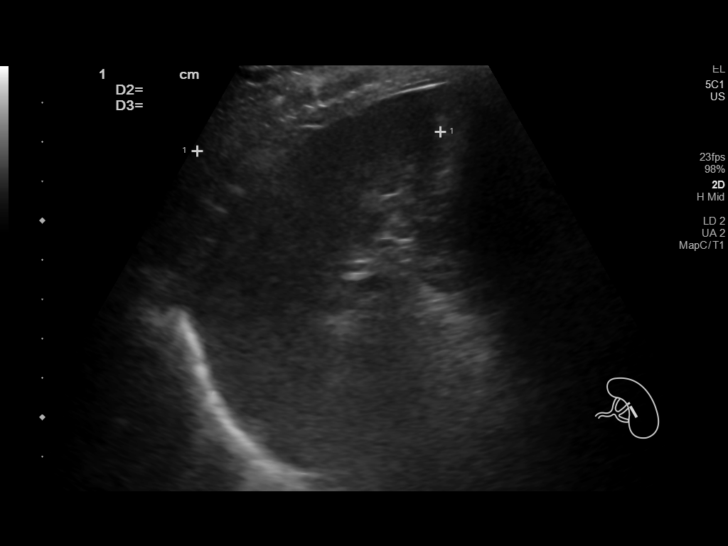
[im 34/62]
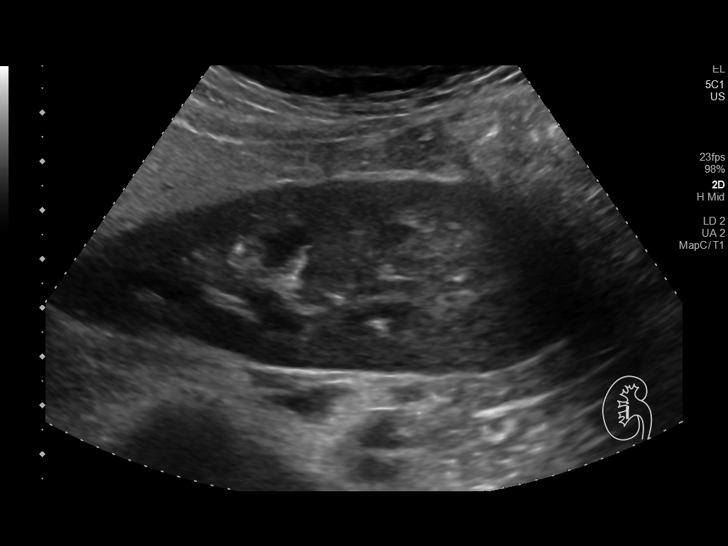
[im 39/62]
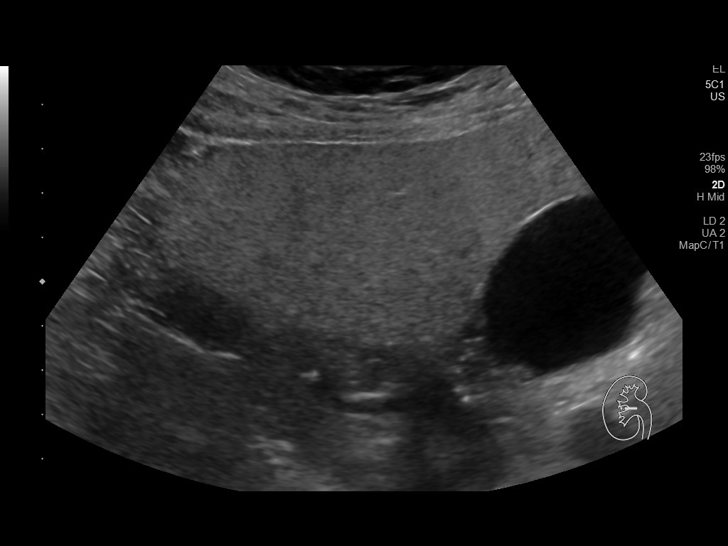
[im 41/62]
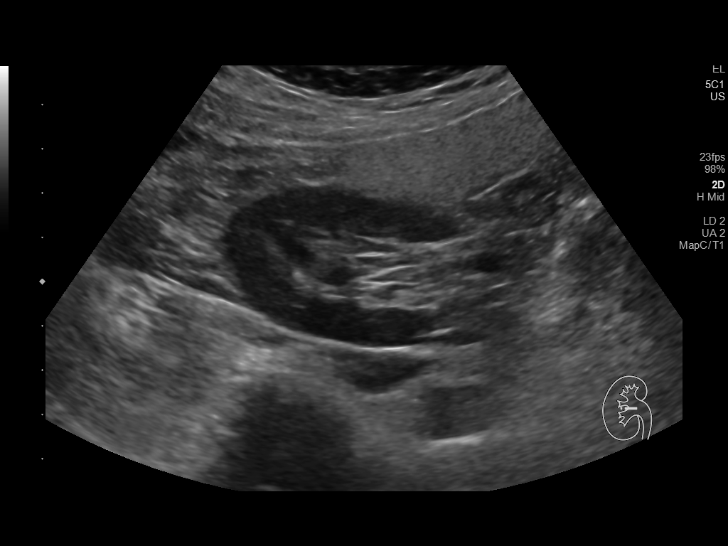
[im 46/62]
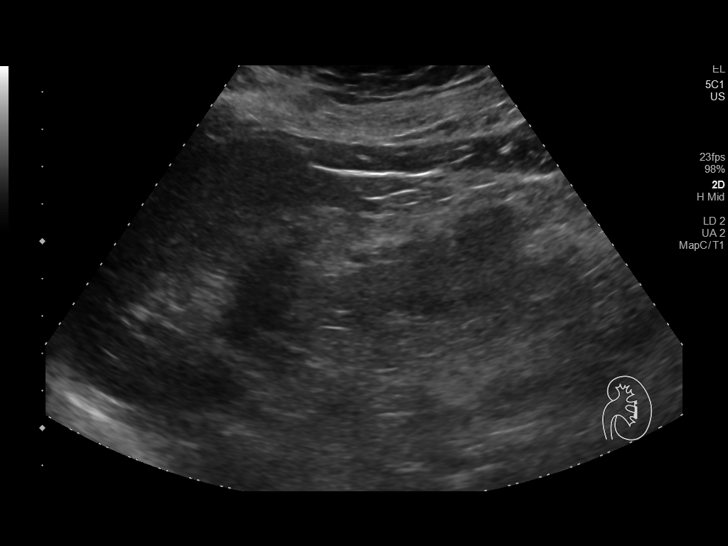
[im 51/62]
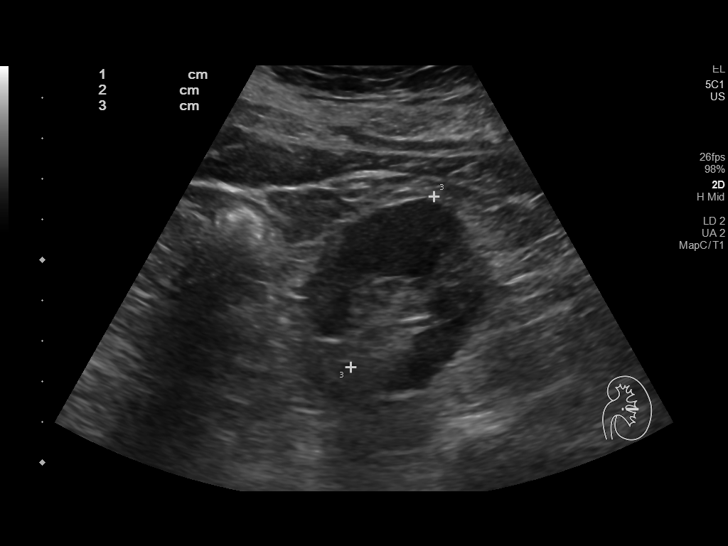
[im 56/62]
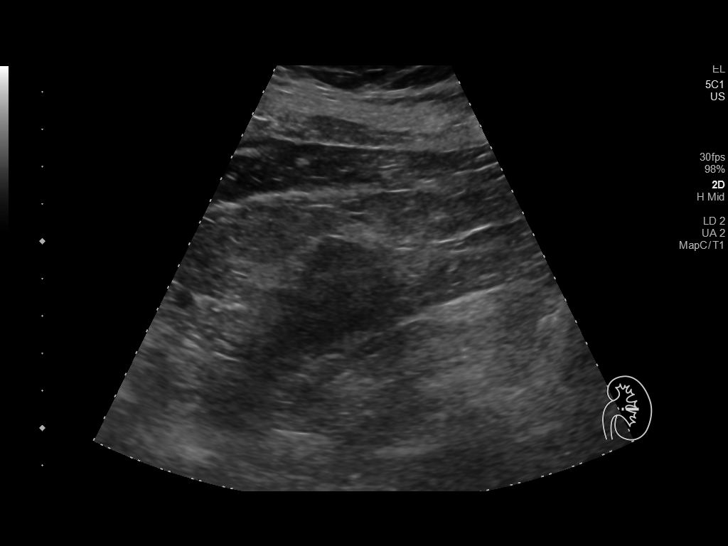
[im 62/62]
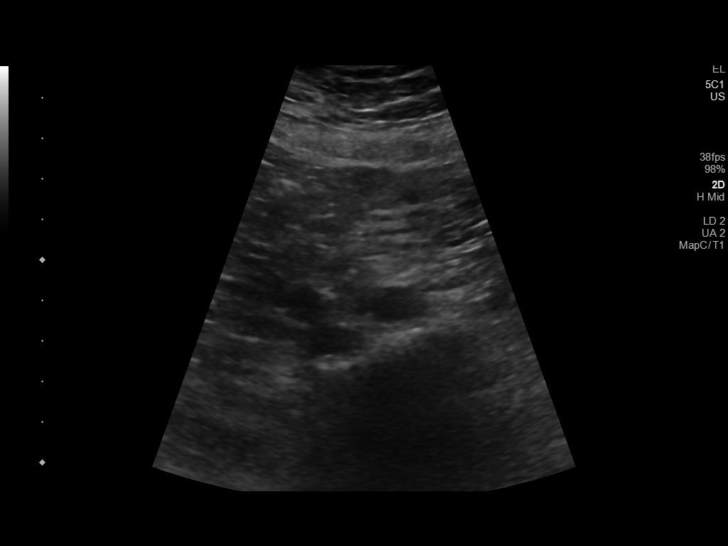

[14 of 25 positions shown; findings below may reference images not displayed]

FINDINGS: Gallbladder: Normally distended without stones or wall thickening.
No pericholecystic fluid or sonographic Murphy sign.

Common bile duct: Diameter: 4 mm diameter, normal

Liver: Echogenic parenchyma, likely fatty infiltration though this
can be seen with cirrhosis and certain infiltrative disorders. No
focal hepatic mass or nodularity. Portal vein is patent on color
Doppler imaging with normal direction of blood flow towards the
liver.

IVC: Normal appearance

Pancreas: Normal appearance

Spleen: Normal appearance, 6.2 cm length

Right Kidney: Length: 10.5 cm. Normal morphology without mass or
hydronephrosis.

Left Kidney: Length: 11.0 cm. Normal morphology without mass or
hydronephrosis.

Abdominal aorta: Normal caliber

Other findings: No free fluid
IMPRESSION: Probable fatty infiltration of liver as above.

Otherwise negative exam.

## 2020-01-27 NOTE — Progress Notes (Signed)
Patient Care Team: [name], MD as PCP - General (Internal Medicine) Rolm Bookbinder, MD as Consulting Physician (General Surgery) Nicholas Lose, MD as Consulting Physician (Hematology and Oncology) Kyung Rudd, MD as Consulting Physician (Radiation Oncology)  DIAGNOSIS:    ICD-10-CM   1. Malignant neoplasm of upper-inner quadrant of left breast in female, estrogen receptor negative ([place])  C50.212    Z17.1     SUMMARY OF ONCOLOGIC HISTORY: Oncology History  Malignant neoplasm of upper-inner quadrant of left breast in female, estrogen receptor negative ([place])  08/17/2018 Initial Diagnosis   Screening mammogram detected abnormality in the left breast by ultrasound irregular mass 10:00 position 1.3 cm, 10 o'clock position no enlarged lymph nodes, Biopsy ([id number]) revealed: [place], grade 3; ER 0%, PR 0%, Ki-67 80%, HER-2 positive 3+ by [facility]   08/28/2018 Cancer Staging   Staging form: Breast, AJCC 8th Edition - Clinical stage from 08/28/2018: Stage IA (cT1c, cN0, cM0, G3, ER-, PR-, HER2+) - Signed by Nicholas Lose, MD on 08/28/2018   08/28/2018 Breast MRI   malignancy within the UPPER INNER LEFT breast with dominant mass measuring 2.1 cm. With small adjacent satellite nodules, the entire area 1.5 x 3 x 2.2 cm.No evidence of abnormal lymph nodes or RIGHT breast malignancy.   09/03/2018 -  Chemotherapy   Weekly Taxol and Herceptin followed by maintenance Herceptin for one year.  EF: 60-65% on 06/21/2019   12/05/2018 Surgery   Left lumpectomy Donne Hazel) [id number]): No residual invasive carcinoma, margins negative, 0/2 lymph nodes negative, complete pathologic response [id number]   01/15/2019 - 02/11/2019 Radiation Therapy   The patient initially received a dose of 42.56 Gy in 16 fractions to the left breast using whole-breast tangent fields. This was delivered using a 3-D conformal technique. The patient then received a boost to the seroma. This delivered an additional 8 Gy in 4  fractions using a 3 field photon technique due to the depth of the seroma. The total dose was 50.56 Gy.     CHIEF COMPLIANT: Follow-up of left breast cancer   INTERVAL HISTORY: Andrea Clarke is a 74 y.o. with above-mentioned history of left breast cancer who underwent neoadjuvant chemotherapy, lumpectomy, radiation, maintenance Herceptin, and is currently on surveillance. Mammogram and [place] on 08/01/19 showed no evidence of malignancy bilaterally. She presents to the clinic today for follow-up.    ALLERGIES:  is allergic to adhesive [tape]; oxycodone hcl; augmentin [amoxicillin-pot clavulanate]; and xylocaine [lidocaine hcl].  MEDICATIONS:  Current Outpatient Medications  Medication Sig Dispense Refill  . acetaminophen (TYLENOL) 500 MG tablet Take 1,000 mg by mouth daily as needed for moderate pain or headache.     Marland Kitchen atorvastatin (LIPITOR) 20 MG tablet Take 20 mg by mouth daily.    . brimonidine-timolol (COMBIGAN) 0.2-0.5 % ophthalmic solution Place 1 drop into both eyes every 12 (twelve) hours.    . cetirizine (ZYRTEC) 10 MG tablet Take 10 mg by mouth daily.    . Cholecalciferol (VITAMIN D3) 125 MCG (5000 UT) CAPS Take 5,000 Units by mouth at bedtime.     . Coenzyme Q10 10 MG capsule Take 10 mg by mouth at bedtime.    Marland Kitchen dexlansoprazole (DEXILANT) 60 MG capsule Take 60 mg by mouth daily.    Marland Kitchen diltiazem (CARDIZEM CD) 120 MG 24 hr capsule TAKE 1 CAPSULE BY MOUTH  DAILY 90 capsule 3  . flecainide (TAMBOCOR) 100 MG tablet TAKE 1 TABLET BY MOUTH  TWICE DAILY 180 tablet 1  . fluticasone (FLONASE) 50 MCG/ACT nasal  spray Place 1 spray into both nostrils daily as needed for allergies.     . furosemide (LASIX) 20 MG tablet TAKE 1 TABLET(20 MG) BY MOUTH DAILY 30 tablet 0  . Krill Oil 500 MG CAPS Take 500 mg by mouth at bedtime.    . montelukast (SINGULAIR) 10 MG tablet Take 10 mg by mouth daily.    . Multiple Vitamin (MULTIVITAMIN) tablet Take 1 tablet by mouth at bedtime.     Vladimir Faster  Glycol-Propyl Glycol (SYSTANE OP) Place 1 drop into both eyes 2 (two) times daily as needed (dry eyes).    . quinapril (ACCUPRIL) 20 MG tablet Take 1 tablet (20 mg total) by mouth daily. Please make overdue appt with Dr. Johnsie Cancel before anymore refills. 3rd and Final attempt 15 tablet 0   No current facility-administered medications for this visit.   Facility-Administered Medications Ordered in Other Visits  Medication Dose Route Frequency Provider Last Rate Last Admin  . sodium chloride flush (NS) 0.9 % injection 10 mL  10 mL Intracatheter Once Nicholas Lose, MD        PHYSICAL EXAMINATION: ECOG PERFORMANCE STATUS: 1 - Symptomatic but completely ambulatory  Vitals:   01/28/20 0924  BP: 139/74  Pulse: (!) 58  Resp: 18  Temp: 97.8 F (36.6 C)  SpO2: 100%   Filed Weights   01/28/20 0924  Weight: 162 lb 3.2 oz (73.6 kg)    BREAST: No palpable masses or nodules in either right or left breasts. No palpable axillary supraclavicular or infraclavicular adenopathy no breast tenderness or nipple discharge. (exam performed in the presence of a chaperone)  LABORATORY DATA:  I have reviewed the data as listed CMP Latest Ref Rng & Units 09/23/2019 07/30/2019 06/18/2019  Glucose 65 - 99 mg/dL 136(H) 137(H) 133(H)  BUN 8 - 27 mg/dL '17 16 17  ' Creatinine 0.57 - 1.00 mg/dL 0.83 0.78 0.75  Sodium 134 - 144 mmol/L 141 139 139  Potassium 3.5 - 5.2 mmol/L 4.2 4.2 4.2  Chloride 96 - 106 mmol/L 101 103 104  CO2 20 - 29 mmol/L '26 25 25  ' Calcium 8.7 - 10.3 mg/dL 9.3 9.1 9.1  Total Protein 6.0 - 8.5 g/dL 6.3 6.9 6.6  Total Bilirubin 0.0 - 1.2 mg/dL 0.5 0.7 0.8  Alkaline Phos 39 - 117 IU/L 116 105 92  AST 0 - 40 IU/L 74(H) 81(H) 94(H)  ALT 0 - 32 IU/L 109(H) 112(H) 128(H)    Lab Results  Component Value Date   WBC 6.5 09/23/2019   HGB 15.1 09/23/2019   HCT 42.5 09/23/2019   MCV 100 (H) 09/23/2019   PLT 152 09/23/2019   NEUTROABS 3.9 07/30/2019    ASSESSMENT & PLAN:  Malignant neoplasm of  upper-inner quadrant of left breast in female, estrogen receptor negative ([place]) 08/17/2018:Screening mammogram detected abnormality in the left breast by ultrasound irregular mass 10:00 position 1.3 cm, 10 o'clock position no enlarged lymph nodes, ER 0%, PR 0%, Ki-67 80%, HER-2 +3+ by IHC, grade 3, lymphovascular invasion present, T1c N0 stage Ia clinical stage  12/05/2018:Left lumpectomy: No residual invasive carcinoma, margins negative, 0/2 lymph nodes negative, complete pathologic response ypT0ypN0 Adjuvant radiation therapy: 01/16/2019-02/11/2019 Herceptin maintenance completed October 2020  Treatment plan: Surveillance Breast cancer surveillance: 1.breast exam: 01/28/2020: Benign 2.mammogram scheduled for 02/03/2020  Return to clinic in 1 year for follow-up     No orders of the defined types were placed in this encounter.  The patient has a good understanding of the overall plan. she  agrees with it. she will call with any problems that may develop before the next visit here.  Total time spent: 20 mins including face to face time and time spent for planning, charting and coordination of care  Nicholas Lose, MD 01/28/2020  I, Cloyde Reams Dorshimer, am acting as scribe for Dr. Nicholas Lose.  I have reviewed the above documentation for accuracy and completeness, and I agree with the above.

## 2020-01-28 ENCOUNTER — Inpatient Hospital Stay: Payer: PRIVATE HEALTH INSURANCE | Attending: Hematology and Oncology | Admitting: Hematology and Oncology

## 2020-01-28 ENCOUNTER — Other Ambulatory Visit: Payer: Self-pay

## 2020-01-28 DIAGNOSIS — Z853 Personal history of malignant neoplasm of breast: Secondary | ICD-10-CM | POA: Insufficient documentation

## 2020-01-28 DIAGNOSIS — C50212 Malignant neoplasm of upper-inner quadrant of left female breast: Secondary | ICD-10-CM | POA: Diagnosis not present

## 2020-01-28 DIAGNOSIS — Z171 Estrogen receptor negative status [ER-]: Secondary | ICD-10-CM | POA: Diagnosis not present

## 2020-01-28 DIAGNOSIS — Z923 Personal history of irradiation: Secondary | ICD-10-CM | POA: Diagnosis not present

## 2020-01-28 DIAGNOSIS — Z9221 Personal history of antineoplastic chemotherapy: Secondary | ICD-10-CM | POA: Diagnosis not present

## 2020-01-28 MED ORDER — LATANOPROST 0.005 % OP SOLN: 1.0000 [drp] | Freq: Every day | OPHTHALMIC | 12 refills | Status: DC

## 2020-01-28 NOTE — Assessment & Plan Note (Signed)
08/17/2018:Screening mammogram detected abnormality in the left breast by ultrasound irregular mass 10:00 position 1.3 cm, 10 o'clock position no enlarged lymph nodes, ER 0%, PR 0%, Ki-67 80%, HER-2 +3+ by IHC, grade 3, lymphovascular invasion present, T1c N0 stage Ia clinical stage  12/05/2018:Left lumpectomy: No residual invasive carcinoma, margins negative, 0/2 lymph nodes negative, complete pathologic response ypT0ypN0 Adjuvant radiation therapy: 01/16/2019-02/11/2019 Herceptin maintenance completed October 2020  Treatment plan: Surveillance Breast cancer surveillance: 1.breast exam: 01/28/2020: Benign 2.mammogram scheduled for 02/03/2020  Return to clinic in 1 year for follow-up

## 2020-02-03 ENCOUNTER — Other Ambulatory Visit: Payer: Self-pay | Admitting: Hematology and Oncology

## 2020-02-03 ENCOUNTER — Ambulatory Visit
Admission: RE | Admit: 2020-02-03 | Discharge: 2020-02-03 | Disposition: A | Discharge status: Home / Self Care | Payer: PRIVATE HEALTH INSURANCE | Source: Ambulatory Visit | Attending: Hematology and Oncology | Admitting: Hematology and Oncology

## 2020-02-03 ENCOUNTER — Other Ambulatory Visit: Payer: Self-pay

## 2020-02-03 DIAGNOSIS — Z171 Estrogen receptor negative status [ER-]: Secondary | ICD-10-CM

## 2020-02-03 DIAGNOSIS — C50212 Malignant neoplasm of upper-inner quadrant of left female breast: Secondary | ICD-10-CM

## 2020-02-05 ENCOUNTER — Telehealth: Payer: Self-pay | Admitting: Hematology and Oncology

## 2020-02-05 NOTE — Telephone Encounter (Signed)
Scheduled per 04/13 los, patient has been called and notified.  

## 2020-02-09 IMAGING — US US AXILLARY LEFT
1 series · 3 of 3 positions shown · non-contrast
Comparison: Previous exam(s).

CLINICAL DATA: 73-year-old female presenting for routine annual
surveillance status post left breast lumpectomy in 4580. She was
diagnosed with grade 3 invasive mammary carcinoma of the left breast
with lymphovascular invasion.The patient states that she has had
increased fullness and tenderness at her axillary lymph node
dissection site, where she has a known seroma that was drained in

EXAM:
DIGITAL DIAGNOSTIC BILATERAL MAMMOGRAM WITH CAD AND TOMO
ULTRASOUND LEFT AXILLA

[Series 1: us axillary left · 0.07mm/px · 3 of 3 slices shown]
[im 1/3]
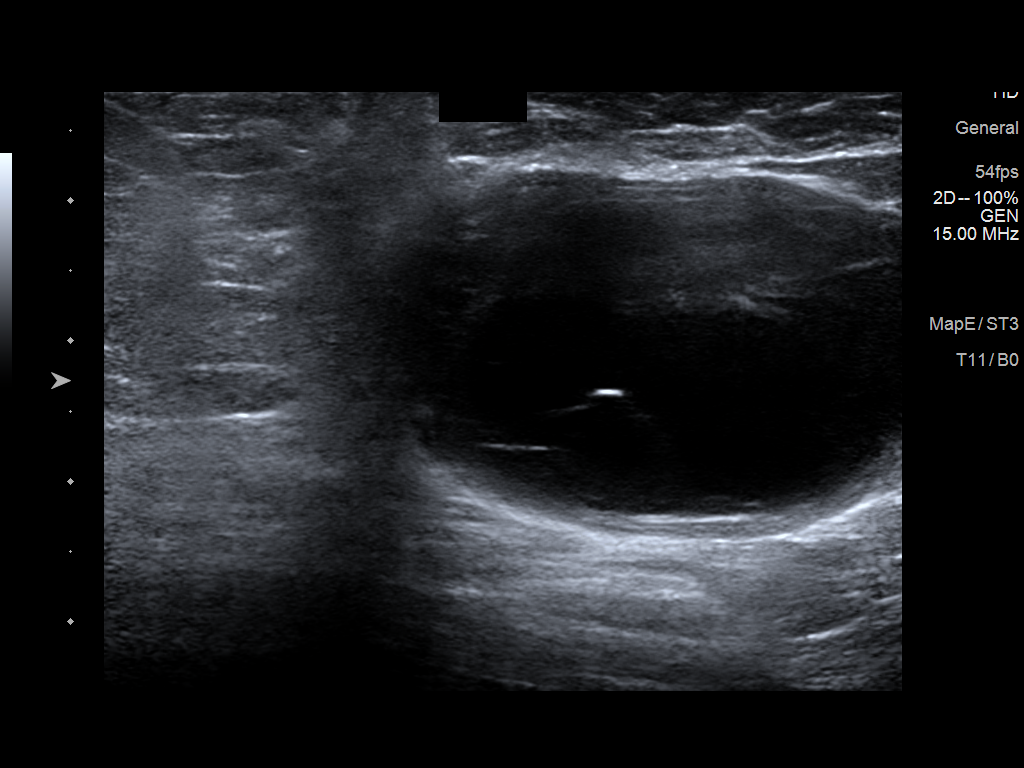
[im 2/3]
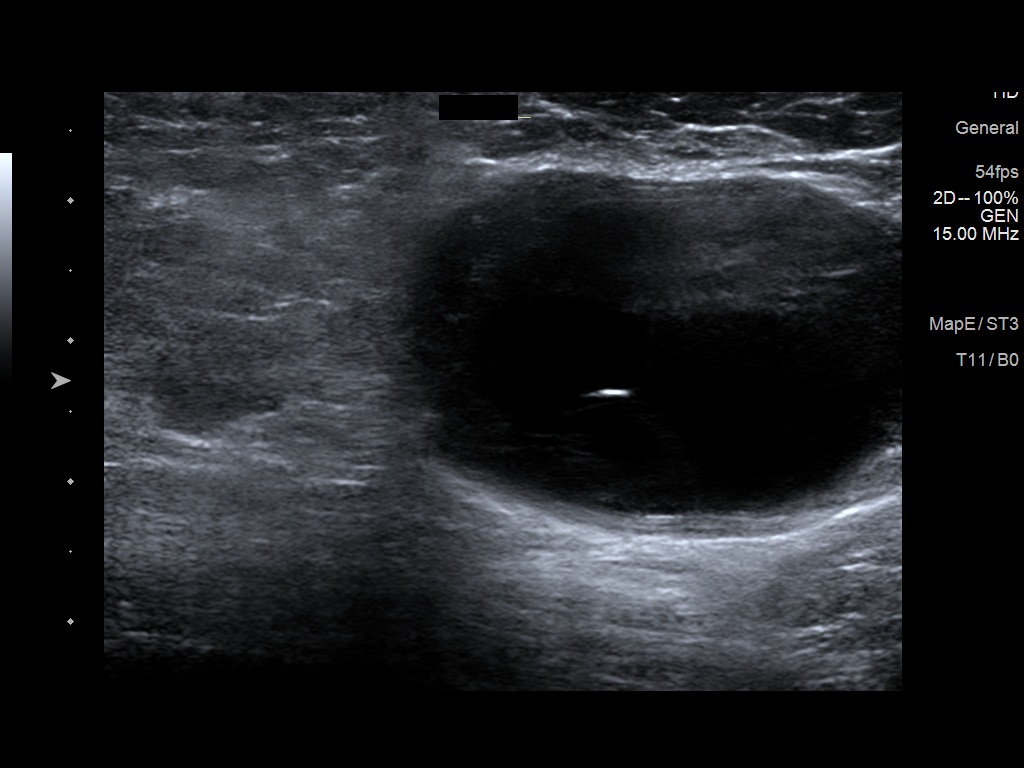
[im 3/3]
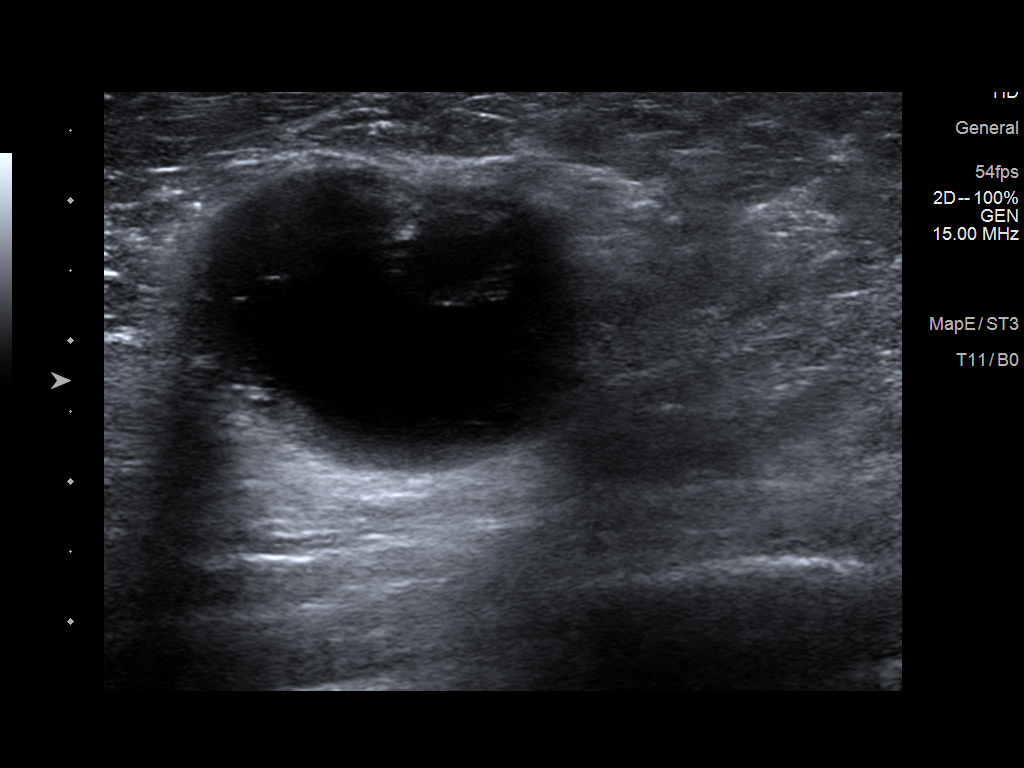

[3 of 3 positions shown; findings below may reference images not displayed]

ACR Breast Density Category b: There are scattered areas of
fibroglandular density.
FINDINGS: On the initial full paddle MLO image of the right breast, there is a
questionable area of distortion along the anterior margin of the
seroma. Spot compression tomosynthesis images over this site
demonstrates that the appearance is compatible with postsurgical
changes and this area of presumed scarring extends to the skin
surface. The seroma has decreased in size, now measuring
approximately 4.0 cm, previously about 6 cm prior to the aspiration
which was performed in Friday February, 2019. No other suspicious
calcifications, masses or areas of distortion are seen in the
bilateral breasts.

Mammographic images were processed with CAD.

Ultrasound of the left axilla demonstrates a persistent seroma
measuring approximately 4 cm. An area of echogenic surgical scarring
is seen along the anterior aspect of the seroma extending to the
skin surface, likely corresponding with the asymmetry seen
mammographically. No suspicious masses are identified.
IMPRESSION: 1. The asymmetry along the anterior aspect of the seroma is slightly
more prominent than seen on the mammogram from 02/19/2019. This is
likely due to progressive healing from the axillary lymph node
dissection.

2. Decreased in size of the left axillary seroma from the prior
mammogram (02/19/2019), but increased in size from the time of
aspiration (02/28/2019).

3. No suspicious findings are seen at the lumpectomy site in the
central to medial left breast.

4.  No mammographic evidence of malignancy in the bilateral breasts.

RECOMMENDATION:
1. Six-month follow-up left breast mammogram and possible ultrasound
is recommended.

I have discussed the findings and recommendations with the patient.
If applicable, a reminder letter will be sent to the patient
regarding the next appointment.

BI-RADS CATEGORY  3: Probably benign.

## 2020-02-10 ENCOUNTER — Ambulatory Visit: Payer: PRIVATE HEALTH INSURANCE | Admitting: Hematology and Oncology

## 2020-03-09 ENCOUNTER — Other Ambulatory Visit: Payer: Self-pay | Admitting: Cardiology

## 2020-05-13 ENCOUNTER — Other Ambulatory Visit: Payer: Self-pay | Admitting: General Surgery

## 2020-05-13 DIAGNOSIS — N644 Mastodynia: Secondary | ICD-10-CM

## 2020-05-28 ENCOUNTER — Ambulatory Visit
Admission: RE | Admit: 2020-05-28 | Discharge: 2020-05-28 | Disposition: A | Discharge status: Home / Self Care | Payer: PRIVATE HEALTH INSURANCE | Source: Ambulatory Visit | Attending: General Surgery | Admitting: General Surgery

## 2020-05-28 ENCOUNTER — Other Ambulatory Visit: Payer: Self-pay

## 2020-05-28 DIAGNOSIS — N644 Mastodynia: Secondary | ICD-10-CM

## 2020-07-14 ENCOUNTER — Ambulatory Visit (INDEPENDENT_AMBULATORY_CARE_PROVIDER_SITE_OTHER): Payer: PRIVATE HEALTH INSURANCE | Admitting: Cardiology

## 2020-07-14 ENCOUNTER — Other Ambulatory Visit: Payer: Self-pay

## 2020-07-14 ENCOUNTER — Encounter: Payer: Self-pay | Admitting: Cardiology

## 2020-07-14 VITALS — BP 112/62 | HR 60 | Ht 63.0 in | Wt 149.0 lb

## 2020-07-14 DIAGNOSIS — I472 Ventricular tachycardia, unspecified: Secondary | ICD-10-CM

## 2020-07-14 NOTE — Progress Notes (Signed)
Electrophysiology Office Note   Date:  07/14/2020   ID:  Andrea, Clarke [birth year], MRN [id number]  PCP:  Andrea Clarke., MD  Cardiologist:  Andrea Clarke Primary Electrophysiologist:  Andrea Clarke Andrea Leeds, MD    No chief complaint on file.    History of Present Illness: Andrea Clarke is a 74 y.o. female who is being seen today for the evaluation of wide complex tachycardia at the request of Andrea Clarke., MD. Presenting today for electrophysiology evaluation.  She has a history of hypertension, hyperlipidemia, and chest pain.  She had a nuclear stress test which was low risk.  She wore a Holter monitor that showed salvos of wide complex tachycardia of up to 19 beats.  Each episode was monomorphic, but she did have multiple morphologies.  She has also had episodes of SVT.  Today, denies symptoms of palpitations, chest pain, shortness of breath, orthopnea, PND, lower extremity edema, claudication, dizziness, presyncope, syncope, bleeding, or neurologic sequela. The patient is tolerating medications without difficulties.  Since last being seen she has not had any further cardiac complaints.  Unfortunately she was diagnosed with breast cancer 2 years ago.  She is now status post lumpectomy, chemotherapy, and radiation.  She has had complications with seromas in the area of her lumpectomy, but aside from that has had no further issues.  She continues to exercise, and has lost up to 13 pounds from her heaviest weight.  She gained weight as she was feeling quite fatigued during her cancer therapy.  Her goal is 135 pounds.  She continues to walk and is going to start lifting weights.  Past Medical History:  Diagnosis Date  . Atrial tachycardia ([place])   . Breast cancer ([place])    left breast cancer 2019  . 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 Allegra Lai)  . GERD (gastroesophageal reflux disease)   . Glaucoma   . Hyperlipidemia   . Hypertension    . Personal history of chemotherapy   . Personal history of radiation therapy    finished 02/11/19  . PONV (postoperative nausea and vomiting)    did well with lumpectomy and port placement   Past Surgical History:  Procedure Laterality Date  . ABDOMINAL HYSTERECTOMY    . ANKLE SURGERY    . APPENDECTOMY    . BREAST BIOPSY     Left  . BREAST LUMPECTOMY Left 12/05/2018  . BREAST LUMPECTOMY WITH RADIOACTIVE SEED AND SENTINEL LYMPH NODE BIOPSY N/A 12/05/2018   Procedure: LEFT BREAST LUMPECTOMY WITH RADIOACTIVE SEED AND LEFT AXILLARY SENTINEL LYMPH NODE BIOPSY AND BLUE DYE INJECTION;  Surgeon: Rolm Bookbinder, MD;  Location: [facility];  Service: General;  Laterality: N/A;  . CATARACT EXTRACTION W/ INTRAOCULAR LENS IMPLANT Bilateral   . PORT-A-CATH REMOVAL N/A 08/27/2019   Procedure: MINOR REMOVAL PORT-A-CATH;  Surgeon: Rolm Bookbinder, MD;  Location: [facility];  Service: General;  Laterality: N/A;  . PORTACATH PLACEMENT Right 09/03/2018   Procedure: INSERTION PORT-A-CATH WITH [place];  Surgeon: Rolm Bookbinder, MD;  Location: [facility];  Service: General;  Laterality: Right;  . ROTATOR CUFF REPAIR Right   . TONSILLECTOMY       Current Outpatient Medications  Medication Sig Dispense Refill  . acetaminophen (TYLENOL) 500 MG tablet Take 1,000 mg by mouth daily as needed for moderate pain or headache.     Marland Kitchen atorvastatin (LIPITOR) 20 MG tablet Take 20 mg by mouth daily.    . cetirizine (  ZYRTEC) 10 MG tablet Take 10 mg by mouth daily.    . Cholecalciferol (VITAMIN D3) 125 MCG (5000 UT) CAPS Take 5,000 Units by mouth at bedtime.     . Coenzyme Q10 10 MG capsule Take 10 mg by mouth at bedtime.    Marland Kitchen dexlansoprazole (DEXILANT) 60 MG capsule Take 60 mg by mouth daily.    Marland Kitchen diltiazem (CARDIZEM CD) 120 MG 24 hr capsule TAKE 1 CAPSULE BY MOUTH  DAILY 90 capsule 3  . flecainide (TAMBOCOR) 100 MG tablet TAKE 1 TABLET BY MOUTH  TWICE DAILY 180 tablet 3  . fluticasone (FLONASE) 50 MCG/ACT  nasal spray Place 1 spray into both nostrils daily as needed for allergies.     . furosemide (LASIX) 20 MG tablet TAKE 1 TABLET(20 MG) BY MOUTH DAILY 30 tablet 0  . Krill Oil 500 MG CAPS Take 500 mg by mouth at bedtime.    Marland Kitchen latanoprost (XALATAN) 0.005 % ophthalmic solution Place 1 drop into both eyes at bedtime. 2.5 mL 12  . montelukast (SINGULAIR) 10 MG tablet Take 10 mg by mouth daily.    . Multiple Vitamin (MULTIVITAMIN) tablet Take 1 tablet by mouth at bedtime.     Vladimir Faster Glycol-Propyl Glycol (SYSTANE OP) Place 1 drop into both eyes 2 (two) times daily as needed (dry eyes).    . quinapril (ACCUPRIL) 20 MG tablet Take 1 tablet (20 mg total) by mouth daily. Please make overdue appt with Dr. Johnsie Clarke before anymore refills. 3rd and Final attempt 15 tablet 0  . RYBELSUS 3 MG TABS Take 1 tablet by mouth at bedtime.     No current facility-administered medications for this visit.   Facility-Administered Medications Ordered in Other Visits  Medication Dose Route Frequency Provider Last Rate Last Admin  . sodium chloride flush (NS) 0.9 % injection 10 mL  10 mL Intracatheter Once Nicholas Lose, MD        Allergies:   Adhesive [tape], Oxycodone hcl, Augmentin [amoxicillin-pot clavulanate], and Xylocaine [lidocaine hcl]   Social History:  The patient  reports that she has quit smoking. She has never used smokeless tobacco. She reports current alcohol use. She reports that she does not use drugs.   Family History:  The patient's family history includes Hypertension in her mother.   ROS:  Please see the history of present illness.   Otherwise, review of systems is positive for none.   All other systems are reviewed and negative.   PHYSICAL EXAM: VS:  BP 112/62   Pulse 60   Ht 5\' 3"  (1.6 m)   Wt 149 lb (67.6 kg)   SpO2 99%   BMI 26.39 kg/m  , BMI Body mass index is 26.39 kg/m. GEN: Well nourished, well developed, in no acute distress  HEENT: normal  Neck: no JVD, carotid bruits, or  masses Cardiac: RRR; no murmurs, rubs, or gallops,no edema  Respiratory:  clear to auscultation bilaterally, normal work of breathing GI: soft, nontender, nondistended, + BS MS: no deformity or atrophy  Skin: warm and dry Neuro:  Strength and sensation are intact Psych: euthymic mood, full affect  EKG:  EKG is ordered today. Personal review of the ekg ordered shows sinus rhythm, rate 60  Recent Labs: 09/23/2019: ALT 109; BUN 17; Creatinine, Ser 0.83; Hemoglobin 15.1; Magnesium 2.0; Platelets 152; Potassium 4.2; Sodium 141    Lipid Panel     Component Value Date/Time   CHOL 151 08/22/2008 1042   TRIG 120 08/22/2008 1042   HDL  38.2 (L) 08/22/2008 1042   CHOLHDL 4.0 CALC 08/22/2008 1042   VLDL 24 08/22/2008 1042   LDLCALC 89 08/22/2008 1042     Wt Readings from Last 3 Encounters:  07/14/20 149 lb (67.6 kg)  01/28/20 162 lb 3.2 oz (73.6 kg)  11/12/19 164 lb 3.2 oz (74.5 kg)      Other studies Reviewed: Additional studies/ records that were reviewed today include: Stress echo 07/03/17  Review of the above records today demonstrates:  - Stress ECG conclusions: The stress ECG was normal. [facility] scoring:   exercise time of 6.5 min; maximum ST deviation of 0 mm; no   angina; resulting score is 7. This score predicts a low risk of   cardiac events. - Staged echo: This study is inadequate for the diagnostic   evaluation of left ventricular regional wall motion. - Impressions: Due to very frequent ventricular bigeminy and   salvos, images are of poor quality and cannot rule out wall   motion abnormality with stress. Recommend coronary CTA for   further assessment of calcium score and CAD. May also benefit   from 24 hour Holter monitor to assess PVC load.  07/20/17 Spect  Nuclear stress EF: 72%. The left ventricular ejection fraction is hyperdynamic (>65%).  The study is normal.  This is a low risk study.  Holter 07/26/17 Minimum HR: 41 BPM at 9:56:17 AM(2) Maximum HR: 145  BPM at 10:09:39 AM Average HR: 61 BPM <1% PVCs <1% APCs Multiple runs of wide complex tachycardia Short runs of narrow complex tachycardia  ETT 09/13/17 -personally reviewed  Blood pressure demonstrated a normal response to exercise.  There was no ST segment deviation noted during stress.   No ischemia Rare PVC with exercise Slight QRS widening during stress on flecainide  ASSESSMENT AND PLAN:  1.  Ventricular tachycardia: Currently on flecainide (ECG monitoring for high risk medication) and diltiazem.  She remains in sinus rhythm and has had no further palpitations.  No changes.  2.  Hypertension: Currently well controlled  3.  Hyperlipidemia: Continue atorvastatin    Current medicines are reviewed at length with the patient today.   The patient does not have concerns regarding her medicines.  The following changes were made today: None  Labs/ tests ordered today include:  Orders Placed This Encounter  Procedures  . EKG 12-Lead     Disposition:   FU with Jakota Manthei 6 months  Signed, Alazar Cherian Andrea Leeds, MD  07/14/2020 11:57 AM     [facility] [address] [place] [address] [phone] (office) [phone] (fax)

## 2020-07-14 NOTE — Patient Instructions (Signed)
Medication Instructions:  Your physician recommends that you continue on your current medications as directed. Please refer to the Current Medication list given to you today.  *If you need a refill on your cardiac medications before your next appointment, please call your pharmacy*   Lab Work: None ordered If you have labs (blood work) drawn today and your tests are completely normal, you will receive your results only by: . MyChart Message (if you have MyChart) OR . A paper copy in the mail If you have any lab test that is abnormal or we need to change your treatment, we will call you to review the results.   Testing/Procedures: None ordered   Follow-Up: At [facility], you and your health needs are our priority.  As part of our continuing mission to provide you with exceptional heart care, we have created designated Provider Care Teams.  These Care Teams include your primary Cardiologist (physician) and Advanced Practice Providers (APPs -  Physician Assistants and Nurse Practitioners) who all work together to provide you with the care you need, when you need it.  We recommend signing up for the patient portal called "MyChart".  Sign up information is provided on this After Visit Summary.  MyChart is used to connect with patients for Virtual Visits (Telemedicine).  Patients are able to view lab/test results, encounter notes, upcoming appointments, etc.  Non-urgent messages can be sent to your provider as well.   To learn more about what you can do with MyChart, go to [email or web address].    Your next appointment:   3 month(s)  The format for your next appointment:   In Person  Provider:   Will Camnitz, MD   Thank you for choosing [facility]!!   Elizeth Weinrich, RN [phone]    Other Instructions    

## 2020-08-10 ENCOUNTER — Ambulatory Visit: Payer: PRIVATE HEALTH INSURANCE

## 2020-08-10 ENCOUNTER — Ambulatory Visit
Admission: RE | Admit: 2020-08-10 | Discharge: 2020-08-10 | Disposition: A | Discharge status: Home / Self Care | Payer: PRIVATE HEALTH INSURANCE | Source: Ambulatory Visit | Attending: Hematology and Oncology | Admitting: Hematology and Oncology

## 2020-08-10 ENCOUNTER — Other Ambulatory Visit: Payer: Self-pay

## 2020-08-10 DIAGNOSIS — C50212 Malignant neoplasm of upper-inner quadrant of left female breast: Secondary | ICD-10-CM

## 2020-08-10 DIAGNOSIS — Z171 Estrogen receptor negative status [ER-]: Secondary | ICD-10-CM

## 2020-08-13 ENCOUNTER — Other Ambulatory Visit: Payer: Self-pay | Admitting: Cardiology

## 2020-09-28 ENCOUNTER — Telehealth: Payer: Self-pay | Admitting: *Deleted

## 2020-09-28 NOTE — Progress Notes (Signed)
Patient Care Team: [name], MD as PCP - General (Internal Medicine) Constance Haw, MD as PCP - Electrophysiology (Cardiology) Rolm Bookbinder, MD as Consulting Physician (General Surgery) Nicholas Lose, MD as Consulting Physician (Hematology and Oncology) Kyung Rudd, MD as Consulting Physician (Radiation Oncology)  DIAGNOSIS:    ICD-10-CM   1. Malignant neoplasm of upper-inner quadrant of left breast in female, estrogen receptor negative (Andrea Clarke)  C50.212    Z17.1     SUMMARY OF ONCOLOGIC HISTORY: Oncology History  Malignant neoplasm of upper-inner quadrant of left breast in female, estrogen receptor negative (Andrea Clarke)  08/17/2018 Initial Diagnosis   Screening mammogram detected abnormality in the left breast by ultrasound irregular mass 10:00 position 1.3 cm, 10 o'clock position no enlarged lymph nodes, Biopsy ([id number]) revealed: [place], grade 3; ER 0%, PR 0%, Ki-67 80%, HER-2 positive 3+ by [facility]   08/28/2018 Cancer Staging   Staging form: Breast, AJCC 8th Edition - Clinical stage from 08/28/2018: Stage IA (cT1c, cN0, cM0, G3, ER-, PR-, HER2+) - Signed by Nicholas Lose, MD on 08/28/2018   08/28/2018 Breast MRI   malignancy within the UPPER INNER LEFT breast with dominant mass measuring 2.1 cm. With small adjacent satellite nodules, the entire area 1.5 x 3 x 2.2 cm.No evidence of abnormal lymph nodes or RIGHT breast malignancy.   09/03/2018 -  Chemotherapy   Weekly Taxol and Herceptin followed by maintenance Herceptin for one year.  EF: 60-65% on 06/21/2019   12/05/2018 Surgery   Left lumpectomy Andrea Clarke) [phone]): No residual invasive carcinoma, margins negative, 0/2 lymph nodes negative, complete pathologic response [id number]   01/15/2019 - 02/11/2019 Radiation Therapy   The patient initially received a dose of 42.56 Gy in 16 fractions to the left breast using whole-breast tangent fields. This was delivered using a 3-D conformal technique. The patient then received  a boost to the seroma. This delivered an additional 8 Gy in 4 fractions using a 3 field photon technique due to the depth of the seroma. The total dose was 50.56 Gy.     CHIEF COMPLIANT: Follow-up of left breast cancer   INTERVAL HISTORY: Andrea Clarke is a 74 y.o. with above-mentioned history of left breast cancer who underwent neoadjuvant chemotherapy, lumpectomy, radiation, maintenance Herceptin, and is currently on surveillance.Mammograms on 02/03/20 and 05/28/20 showed no evidence of malignancy in the left breast. Mammogram on 08/10/20 showed no evidence of malignancy bilaterally. She presents to the clinic todayfor follow-up. Patient came in today because she felt a prominent bone on her right clavicle area.  She had lost 23 pounds by exercise and diet.  Other than that she has no other complaints.  ALLERGIES:  is allergic to adhesive [tape], oxycodone hcl, augmentin [amoxicillin-pot clavulanate], and xylocaine [lidocaine hcl].  MEDICATIONS:  Current Outpatient Medications  Medication Sig Dispense Refill  . acetaminophen (TYLENOL) 500 MG tablet Take 1,000 mg by mouth daily as needed for moderate pain or headache.     Marland Kitchen atorvastatin (LIPITOR) 20 MG tablet Take 20 mg by mouth daily.    . cetirizine (ZYRTEC) 10 MG tablet Take 10 mg by mouth daily.    . Cholecalciferol (VITAMIN D3) 125 MCG (5000 UT) CAPS Take 5,000 Units by mouth at bedtime.     . Coenzyme Q10 10 MG capsule Take 10 mg by mouth at bedtime.    Marland Kitchen dexlansoprazole (DEXILANT) 60 MG capsule Take 60 mg by mouth daily.    Marland Kitchen diltiazem (CARTIA XT) 120 MG 24 hr capsule TAKE 1 CAPSULE BY  MOUTH  DAILY 90 capsule 3  . flecainide (TAMBOCOR) 100 MG tablet TAKE 1 TABLET BY MOUTH  TWICE DAILY 180 tablet 3  . fluticasone (FLONASE) 50 MCG/ACT nasal spray Place 1 spray into both nostrils daily as needed for allergies.     . furosemide (LASIX) 20 MG tablet TAKE 1 TABLET(20 MG) BY MOUTH DAILY 30 tablet 0  . Krill Oil 500 MG CAPS Take 500  mg by mouth at bedtime.    Marland Kitchen latanoprost (XALATAN) 0.005 % ophthalmic solution Place 1 drop into both eyes at bedtime. 2.5 mL 12  . montelukast (SINGULAIR) 10 MG tablet Take 10 mg by mouth daily.    . Multiple Vitamin (MULTIVITAMIN) tablet Take 1 tablet by mouth at bedtime.     Vladimir Faster Glycol-Propyl Glycol (SYSTANE OP) Place 1 drop into both eyes 2 (two) times daily as needed (dry eyes).    . quinapril (ACCUPRIL) 20 MG tablet Take 1 tablet (20 mg total) by mouth daily. Please make overdue appt with Dr. Johnsie Cancel before anymore refills. 3rd and Final attempt 15 tablet 0  . RYBELSUS 3 MG TABS Take 1 tablet by mouth at bedtime.     No current facility-administered medications for this visit.   Facility-Administered Medications Ordered in Other Visits  Medication Dose Route Frequency Provider Last Rate Last Admin  . sodium chloride flush (NS) 0.9 % injection 10 mL  10 mL Intracatheter Once Nicholas Lose, MD        PHYSICAL EXAMINATION: ECOG PERFORMANCE STATUS: 1 - Symptomatic but completely ambulatory  Vitals:   09/29/20 1323  BP: 132/78  Pulse: 86  Resp: 18  Temp: 97.8 F (36.6 C)  SpO2: 99%   Filed Weights   09/29/20 1323  Weight: 143 lb 14.4 oz (65.3 kg)    BREAST: No palpable masses or nodules in either right or left breasts. No palpable axillary supraclavicular or infraclavicular adenopathy no breast tenderness or nipple discharge.  Prominent of the right clavicle where it meets the manubrium.  (exam performed in the presence of a chaperone)  LABORATORY DATA:  I have reviewed the data as listed CMP Latest Ref Rng & Units 09/23/2019 07/30/2019 06/18/2019  Glucose 65 - 99 mg/dL 136(H) 137(H) 133(H)  BUN 8 - 27 mg/dL '17 16 17  ' Creatinine 0.57 - 1.00 mg/dL 0.83 0.78 0.75  Sodium 134 - 144 mmol/L 141 139 139  Potassium 3.5 - 5.2 mmol/L 4.2 4.2 4.2  Chloride 96 - 106 mmol/L 101 103 104  CO2 20 - 29 mmol/L '26 25 25  ' Calcium 8.7 - 10.3 mg/dL 9.3 9.1 9.1  Total Protein 6.0 - 8.5  g/dL 6.3 6.9 6.6  Total Bilirubin 0.0 - 1.2 mg/dL 0.5 0.7 0.8  Alkaline Phos 39 - 117 IU/L 116 105 92  AST 0 - 40 IU/L 74(H) 81(H) 94(H)  ALT 0 - 32 IU/L 109(H) 112(H) 128(H)    Lab Results  Component Value Date   WBC 6.5 09/23/2019   HGB 15.1 09/23/2019   HCT 42.5 09/23/2019   MCV 100 (H) 09/23/2019   PLT 152 09/23/2019   NEUTROABS 3.9 07/30/2019    ASSESSMENT & PLAN:  Malignant neoplasm of upper-inner quadrant of left breast in female, estrogen receptor negative ([place]) 08/17/2018:Screening mammogram detected abnormality in the left breast by ultrasound irregular mass 10:00 position 1.3 cm, 10 o'clock position no enlarged lymph nodes, ER 0%, PR 0%, Ki-67 80%, HER-2 +3+ by IHC, grade 3, lymphovascular invasion present, T1c N0 stage Ia clinical stage  12/05/2018:Left lumpectomy: No residual invasive carcinoma, margins negative, 0/2 lymph nodes negative, complete pathologic response ypT0ypN0 Adjuvant radiation therapy: 01/16/2019-02/11/2019 Herceptin maintenance completed October 2020  Treatment plan: Surveillance  Breast cancer surveillance: 1.breast exam: 09/29/2020: Benign 2.mammogram 08/10/2020: Benign breast density category B Prominence of the medial aspect of the right clavicle: I discussed with her that it is secondary to profound weight loss and there is no pathological basis for this. Return to clinic in 1 year for follow-up  No orders of the defined types were placed in this encounter.  The patient has a good understanding of the overall plan. she agrees with it. she will call with any problems that may develop before the next visit here.  Total time spent: 30 mins including face to face time and time spent for planning, charting and coordination of care  Nicholas Lose, MD 09/29/2020  I, Cloyde Reams Dorshimer, am acting as scribe for Dr. Nicholas Lose.  I have reviewed the above documentation for accuracy and completeness, and I agree with the above.

## 2020-09-28 NOTE — Telephone Encounter (Signed)
Received call from pt with complaint of newly found lump/nodule on the right of her throat.  Pt states nodule was first noticed on Friday 09/25/20.  Pt denies recent injury, trauma, or sore throat.  Pt states her anxiety if very high and would prefer to be examined by Dr. Lindi Adie in the office.  Apt schedule and pt verbalized understanding of apt date and time.

## 2020-09-29 ENCOUNTER — Inpatient Hospital Stay: Payer: PRIVATE HEALTH INSURANCE | Attending: Hematology and Oncology | Admitting: Hematology and Oncology

## 2020-09-29 ENCOUNTER — Other Ambulatory Visit: Payer: Self-pay

## 2020-09-29 DIAGNOSIS — C50212 Malignant neoplasm of upper-inner quadrant of left female breast: Secondary | ICD-10-CM

## 2020-09-29 DIAGNOSIS — Z923 Personal history of irradiation: Secondary | ICD-10-CM | POA: Diagnosis not present

## 2020-09-29 DIAGNOSIS — Z853 Personal history of malignant neoplasm of breast: Secondary | ICD-10-CM | POA: Diagnosis not present

## 2020-09-29 DIAGNOSIS — Z171 Estrogen receptor negative status [ER-]: Secondary | ICD-10-CM

## 2020-09-29 DIAGNOSIS — Z9221 Personal history of antineoplastic chemotherapy: Secondary | ICD-10-CM | POA: Insufficient documentation

## 2020-09-29 NOTE — Assessment & Plan Note (Signed)
08/17/2018:Screening mammogram detected abnormality in the left breast by ultrasound irregular mass 10:00 position 1.3 cm, 10 o'clock position no enlarged lymph nodes, ER 0%, PR 0%, Ki-67 80%, HER-2 +3+ by IHC, grade 3, lymphovascular invasion present, T1c N0 stage Ia clinical stage  12/05/2018:Left lumpectomy: No residual invasive carcinoma, margins negative, 0/2 lymph nodes negative, complete pathologic response ypT0ypN0 Adjuvant radiation therapy: 01/16/2019-02/11/2019 Herceptin maintenance completed October 2020  Treatment plan: Surveillance  Breast cancer surveillance: 1.breast exam: 09/29/2020: Benign 2.mammogram 08/10/2020: Benign breast density category B  Return to clinic in 1 year for follow-up

## 2020-10-01 ENCOUNTER — Telehealth: Payer: Self-pay | Admitting: Hematology and Oncology

## 2020-10-01 NOTE — Telephone Encounter (Signed)
No 12/14 los, no changes made to pt schedule   

## 2020-12-21 ENCOUNTER — Other Ambulatory Visit: Payer: Self-pay

## 2020-12-21 ENCOUNTER — Ambulatory Visit: Payer: PRIVATE HEALTH INSURANCE | Attending: General Surgery

## 2020-12-21 DIAGNOSIS — I890 Lymphedema, not elsewhere classified: Secondary | ICD-10-CM | POA: Insufficient documentation

## 2020-12-21 DIAGNOSIS — R293 Abnormal posture: Secondary | ICD-10-CM | POA: Insufficient documentation

## 2020-12-21 NOTE — Patient Instructions (Signed)
Pt was instructed in self MLD to Left UE and was given illustrated and written instructions.

## 2020-12-21 NOTE — Therapy (Signed)
[place], [place], [address] Phone: [phone]   Fax:  [phone]  Physical Therapy Evaluation  Patient Details  Name: Andrea Clarke MRN: [id number] Date of Birth: 1946/04/22 Referring Provider (PT): Dr. Donne Hazel   Encounter Date: 12/21/2020   PT End of Session - 12/21/20 0951    Visit Number 1    Number of Visits 18    Date for PT Re-Evaluation 02/01/21    PT Start Time 0850    PT Stop Time 0945    PT Time Calculation (min) 55 min    Activity Tolerance Patient tolerated treatment well    Behavior During Therapy [facility] for tasks assessed/performed           Past Medical History:  Diagnosis Date  . Atrial tachycardia ([place])   . Breast cancer ([place])    left breast cancer 2019  . 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 Allegra Lai)  . GERD (gastroesophageal reflux disease)   . Glaucoma   . Hyperlipidemia   . Hypertension   . Personal history of chemotherapy   . Personal history of radiation therapy    finished 02/11/19  . PONV (postoperative nausea and vomiting)    did well with lumpectomy and port placement    Past Surgical History:  Procedure Laterality Date  . ABDOMINAL HYSTERECTOMY    . ANKLE SURGERY    . APPENDECTOMY    . BREAST BIOPSY     Left  . BREAST LUMPECTOMY Left 12/05/2018  . BREAST LUMPECTOMY Left 2019  . BREAST LUMPECTOMY WITH RADIOACTIVE SEED AND SENTINEL LYMPH NODE BIOPSY N/A 12/05/2018   Procedure: LEFT BREAST LUMPECTOMY WITH RADIOACTIVE SEED AND LEFT AXILLARY SENTINEL LYMPH NODE BIOPSY AND BLUE DYE INJECTION;  Surgeon: Rolm Bookbinder, MD;  Location: [place];  Service: General;  Laterality: N/A;  . CATARACT EXTRACTION W/ INTRAOCULAR LENS IMPLANT Bilateral   . PORT-A-CATH REMOVAL N/A 08/27/2019   Procedure: MINOR REMOVAL PORT-A-CATH;  Surgeon: Rolm Bookbinder, MD;  Location: [place];  Service: General;   Laterality: N/A;  . PORTACATH PLACEMENT Right 09/03/2018   Procedure: INSERTION PORT-A-CATH WITH [place];  Surgeon: Rolm Bookbinder, MD;  Location: [place];  Service: General;  Laterality: Right;  . ROTATOR CUFF REPAIR Right   . TONSILLECTOMY      There were no vitals filed for this visit.    Subjective Assessment - 12/21/20 0855    Subjective Left hand is swollen, but she doesn't feel like her arm is swollen.   She has a sleeve and glove but has not worn them in over a year because she didn't have any swelling. She started swelling on January first and after unloading the car her computer bag fell across her wrist and hand.  She tried wearing her glove but it seemed to make it swell more.  She is unable to wear rings and notes her knuckles are very swollen and tight.    Pertinent History Pt had left lumpectomy with SLNB on 12/05/18/  She struggled with a seroma that was drained 9 times.  She had chemo and radiation.  She was previously seen here for hand and arm swelling from 03/19/19 to 05/13/19.    Patient Stated Goals Not functionally limited but feels a little clumsy. Decrease hand swelling   Currently in Pain? No/denies              [facility] PT Assessment - 12/21/20 0001  Assessment   Medical Diagnosis Left Hand swelling    Referring Provider (PT) Dr. Donne Hazel    Onset Date/Surgical Date 12/05/18    Hand Dominance Right    Prior Therapy yes      Precautions   Precaution Comments lymphedema      Restrictions   Weight Bearing Restrictions No      Balance Screen   Has the patient fallen in the past 6 months No    Has the patient had a decrease in activity level because of a fear of falling?  No    Is the patient reluctant to leave their home because of a fear of falling?  No      Home Environment   Living Environment Private residence    Living Arrangements Spouse/significant other    Available Help at Discharge Family      Prior Function   Level of Independence  Independent    Vocation Full time employment    Vocation Requirements account mgr    Leisure golf      Cognition   Overall Cognitive Status Within Functional Limits for tasks assessed      Observation/Other Assessments   Observations swelling noted left hand/knuckles      Posture/Postural Control   Posture/Postural Control Postural limitations    Postural Limitations Rounded Shoulders;Forward head             LYMPHEDEMA/ONCOLOGY QUESTIONNAIRE - 12/21/20 0001      Surgeries   Lumpectomy Date 12/05/18    Number Lymph Nodes Removed 2      Treatment   Active Chemotherapy Treatment No    Past Chemotherapy Treatment Yes    Date --   09/03/18   Active Radiation Treatment No    Past Radiation Treatment Yes    Date --   02/11/2019   Current Hormone Treatment No    Past Hormone Therapy No      What other symptoms do you have   Are you Having Heaviness or Tightness Yes    Are you having Pain No    Are you having pitting edema Yes    Do you have infections No      Lymphedema Stage   Stage STAGE 2 SPONTANEOUSLY IRREVERSIBLE      Right Upper Extremity Lymphedema   At Axilla  29.8 cm    15 cm Proximal to Olecranon Process 28.2 cm    10 cm Proximal to Olecranon Process 25.4 cm    Olecranon Process 23.2 cm    15 cm Proximal to Ulnar Styloid Process 22.4 cm    10 cm Proximal to Ulnar Styloid Process 19 cm    Just Proximal to Ulnar Styloid Process 14.7 cm    Across Hand at [employer] 18.3 cm    At [place] of 2nd Digit 6.4 cm    At [facility] of Thumb 6 cm      Left Upper Extremity Lymphedema   At Axilla  29.1 cm    15 cm Proximal to Olecranon Process 28.4 cm    10 cm Proximal to Olecranon Process 26.7 cm    Olecranon Process 24 cm    15 cm Proximal to Ulnar Styloid Process 21.9 cm    10 cm Proximal to Ulnar Styloid Process 18.8 cm    Just Proximal to Ulnar Styloid Process 15.1 cm    Across Hand at [employer] 19.1 cm    At [place] of 2nd Digit 7 cm    At  Base of Thumb 6.2  cm                 Flowsheet Row Outpatient Rehab from 12/21/2020 in [place]  Lymphedema Life Impact Scale Total Score 17.65 %      Objective measurements completed on examination: See above findings.               PT Education - 12/21/20 0924    Education Details pt was educated in self MLD to left UE.  She did very well and the technique came back to her where she had done it before.    Person(s) Educated Patient    Methods Explanation;Demonstration;Handout;Verbal cues    Comprehension Verbalized understanding;Returned demonstration;Need further instruction               PT Long Term Goals - 12/21/20 0958      PT LONG TERM GOAL #1   Title Pt will be indepdent in self MLD for managmement of LUE swelling.    Time 4    Period Weeks    Status New    Target Date 01/18/21      PT LONG TERM GOAL #2   Title Pt will obtain appropriate compression garments for long term management of lymphedema prn    Time 4    Period Weeks    Status New    Target Date 01/18/21      PT LONG TERM GOAL #3   Title pt will report decreased left hand tightness by 50% or greater    Time 4    Period Weeks    Status New    Target Date 01/18/21      PT LONG TERM GOAL #4   Title Pt will have finger swelling reduced to normal with bandaging    Time 6    Status New    Target Date 02/01/21                  Plan - 12/21/20 6160    Clinical Impression Statement Pt had been treated here for prior left hand and arm swelling.  Over the last year she has not had to wear compression because she had not swollen, however in January after unloading a car and having her computer fall on her wrist she developed left hand swelling.  She tried wearing her glove but felt it made her hand worse.  Her hand is very swollen, however her arm is not.  I considered having her wrap her hand over her sleeve , however, it might be better to wrap arm with a  single arm wrap so she may wear at night.  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 Scheduled 2x/week but can increase to 3X prn.    Personal Factors and Comorbidities Comorbidity 2    Comorbidities Prior left lumpectomy with SLNB (2), radiation, lymphedema    Stability/Clinical Decision Making Stable/Uncomplicated    Clinical Decision Making Low    Rehab Potential Excellent    PT Frequency 3x / week    PT Duration 6 weeks    PT Treatment/Interventions Therapeutic exercise;Patient/family education;Manual techniques;Manual lymph drainage;Compression bandaging;Vasopneumatic Device    PT Next Visit Plan Had discussed wrapping hand over sleeve, but might be better to try single arm wrap so she can wear at night, review MLD prn.  Sched. 2x/week, but can increase to 3x prn, instruct bandaging    PT Home  Exercise Plan continue MLD as instructed today    Consulted and Agree with Plan of Care Patient           Patient will benefit from skilled therapeutic intervention in order to improve the following deficits and impairments:  Increased edema,Impaired UE functional use,Postural dysfunction  Visit Diagnosis: Lymphedema, not elsewhere classified  Abnormal posture     Problem List Patient Active Problem List   Diagnosis Date Noted  . Port-A-Cath in place 08/31/2018  . Malignant neoplasm of upper-inner quadrant of left breast in female, estrogen receptor negative ([place]) 08/28/2018  . Hypertension   . Hyperlipidemia   . Glaucoma   . GERD (gastroesophageal reflux disease)   . HYPERLIPIDEMIA 06/11/2007  . GLAUCOMA NOS 06/11/2007  . HTN (hypertension) 06/11/2007  . ALLERGIC RHINITIS 06/11/2007  . GERD 06/11/2007  . HERPES ZOSTER, UNCOMPLICATED 10/10/8345    [facility]ie [facility] 12/21/2020, 10:05 AM  [place], [place], [address] Phone: [phone]   Fax:   [phone]  Name: Andrea Clarke MRN: [id number] Date of Birth: 15-Nov-1945 Cheral Almas, PT 12/21/20 10:08 AM

## 2020-12-25 ENCOUNTER — Other Ambulatory Visit: Payer: Self-pay

## 2020-12-25 ENCOUNTER — Ambulatory Visit (INDEPENDENT_AMBULATORY_CARE_PROVIDER_SITE_OTHER): Payer: PRIVATE HEALTH INSURANCE | Admitting: Cardiology

## 2020-12-25 ENCOUNTER — Encounter: Payer: Self-pay | Admitting: Cardiology

## 2020-12-25 ENCOUNTER — Telehealth: Payer: Self-pay | Admitting: Physical Therapy

## 2020-12-25 VITALS — BP 126/70 | HR 66 | Ht 63.0 in | Wt 144.6 lb

## 2020-12-25 DIAGNOSIS — I472 Ventricular tachycardia, unspecified: Secondary | ICD-10-CM

## 2020-12-25 NOTE — Patient Instructions (Signed)
Medication Instructions:  Your physician recommends that you continue on your current medications as directed. Please refer to the Current Medication list given to you today.  *If you need a refill on your cardiac medications before your next appointment, please call your pharmacy*   Lab Work: None ordered   Testing/Procedures: None ordered   Follow-Up: At [facility], you and your health needs are our priority.  As part of our continuing mission to provide you with exceptional heart care, we have created designated Provider Care Teams.  These Care Teams include your primary Cardiologist (physician) and Advanced Practice Providers (APPs -  Physician Assistants and Nurse Practitioners) who all work together to provide you with the care you need, when you need it.   Your next appointment:   6 month(s)  The format for your next appointment:   In Person  Provider:   You may see Will Meredith Leeds, MD or one of the following Advanced Practice Providers on your designated Care Team:    Chanetta Marshall, NP  Tommye Standard, PA-C  Legrand Como "[place]" [place], [place]     Thank you for choosing [facility]!!   Trinidad Curet, RN [phone]

## 2020-12-25 NOTE — Telephone Encounter (Signed)
Returned call to patient. She has been doing self MLD and exercises and her hand swelling is much improved. She wants to cancel her appointments for next week to see if it will stay under control. She will call next week to cancel the rest of her appointments if she needs to or will keep her appointments for the following week. Maudry Diego, PT 12/25/2020@ 12:37 PM

## 2020-12-25 NOTE — Progress Notes (Signed)
Shaking)   Electrophysiology Office Note   Date:  12/25/2020   ID:  Andrea Clarke, Andrea Clarke [birth year], MRN [id number]  PCP:  [name], MD  Cardiologist:  Johnsie Cancel Primary Electrophysiologist:  Cyani Kallstrom Meredith [place], MD    No chief complaint on file.    History of Present Illness: Andrea Clarke is a 75 y.o. female who is being seen today for the evaluation of wide complex tachycardia at the request of [name], MD. Presenting today for electrophysiology evaluation.    She has a history of hypertension, hyperlipidemia, and chest pain.  She had a Myoview which was low risk.  She wore a Holter monitor that showed salvos of wide-complex tachycardia up to 19 beats.  Each episode is monomorphic, but she did have multiple morphologies.  She also had episodes of SVT.  She also has a history of breast cancer status post lumpectomy, chemotherapy, and radiation.  She is currently on flecainide for her ventricular tachycardia.  Today, denies symptoms of palpitations, chest pain, shortness of breath, orthopnea, PND, lower extremity edema, claudication, dizziness, presyncope, syncope, bleeding, or neurologic sequela. The patient is tolerating medications without difficulties.  Since last being seen she has done well.  She has no chest pain or shortness of breath.  She is able to do all of her daily activities.  She has been exercising more and eating healthy.  She has lost 20 pounds since last being seen.  She currently feels much improved with more energy.  Past Medical History:  Diagnosis Date  . Atrial tachycardia ([place])   . Breast cancer ([place])    left breast cancer 2019  . 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 Allegra Lai)  . GERD (gastroesophageal reflux disease)   . Glaucoma   . Hyperlipidemia   . Hypertension   . Personal history of chemotherapy   . Personal history of radiation therapy    finished 02/11/19  . PONV (postoperative  nausea and vomiting)    did well with lumpectomy and port placement   Past Surgical History:  Procedure Laterality Date  . ABDOMINAL HYSTERECTOMY    . ANKLE SURGERY    . APPENDECTOMY    . BREAST BIOPSY     Left  . BREAST LUMPECTOMY Left 12/05/2018  . BREAST LUMPECTOMY Left 2019  . BREAST LUMPECTOMY WITH RADIOACTIVE SEED AND SENTINEL LYMPH NODE BIOPSY N/A 12/05/2018   Procedure: LEFT BREAST LUMPECTOMY WITH RADIOACTIVE SEED AND LEFT AXILLARY SENTINEL LYMPH NODE BIOPSY AND BLUE DYE INJECTION;  Surgeon: Rolm Bookbinder, MD;  Location: [place];  Service: General;  Laterality: N/A;  . CATARACT EXTRACTION W/ INTRAOCULAR LENS IMPLANT Bilateral   . PORT-A-CATH REMOVAL N/A 08/27/2019   Procedure: MINOR REMOVAL PORT-A-CATH;  Surgeon: Rolm Bookbinder, MD;  Location: [place];  Service: General;  Laterality: N/A;  . PORTACATH PLACEMENT Right 09/03/2018   Procedure: INSERTION PORT-A-CATH WITH [place];  Surgeon: Rolm Bookbinder, MD;  Location: [place];  Service: General;  Laterality: Right;  . ROTATOR CUFF REPAIR Right   . TONSILLECTOMY       Current Outpatient Medications  Medication Sig Dispense Refill  . acetaminophen (TYLENOL) 500 MG tablet Take 1,000 mg by mouth daily as needed for moderate pain or headache.     Marland Kitchen atorvastatin (LIPITOR) 20 MG tablet Take 20 mg by mouth daily.    . cetirizine (ZYRTEC) 10 MG tablet Take 10 mg by mouth daily.    . Cholecalciferol (VITAMIN  D3) 125 MCG (5000 UT) CAPS Take 5,000 Units by mouth at bedtime.     . Coenzyme Q10 10 MG capsule Take 10 mg by mouth at bedtime.    Marland Kitchen dexlansoprazole (DEXILANT) 60 MG capsule Take 60 mg by mouth daily.    Marland Kitchen diltiazem (CARTIA XT) 120 MG 24 hr capsule TAKE 1 CAPSULE BY MOUTH  DAILY 90 capsule 3  . flecainide (TAMBOCOR) 100 MG tablet TAKE 1 TABLET BY MOUTH  TWICE DAILY 180 tablet 3  . fluticasone (FLONASE) 50 MCG/ACT nasal spray Place 1 spray into both nostrils daily as needed for allergies.     . furosemide  (LASIX) 20 MG tablet TAKE 1 TABLET(20 MG) BY MOUTH DAILY 30 tablet 0  . Krill Oil 500 MG CAPS Take 500 mg by mouth at bedtime.    Marland Kitchen latanoprost (XALATAN) 0.005 % ophthalmic solution Place 1 drop into both eyes at bedtime. 2.5 mL 12  . montelukast (SINGULAIR) 10 MG tablet Take 10 mg by mouth daily.    . Multiple Vitamin (MULTIVITAMIN) tablet Take 1 tablet by mouth at bedtime.     Vladimir Faster Glycol-Propyl Glycol (SYSTANE OP) Place 1 drop into both eyes 2 (two) times daily as needed (dry eyes).    . quinapril (ACCUPRIL) 20 MG tablet Take 1 tablet (20 mg total) by mouth daily. Please make overdue appt with Dr. Johnsie Cancel before anymore refills. 3rd and Final attempt 15 tablet 0  . RYBELSUS 3 MG TABS Take 1 tablet by mouth at bedtime.     No current facility-administered medications for this visit.   Facility-Administered Medications Ordered in Other Visits  Medication Dose Route Frequency Provider Last Rate Last Admin  . sodium chloride flush (NS) 0.9 % injection 10 mL  10 mL Intracatheter Once Nicholas Lose, MD        Allergies:   Adhesive [tape], Oxycodone hcl, Augmentin [amoxicillin-pot clavulanate], and Xylocaine [lidocaine hcl]   Social History:  The patient  reports that she has quit smoking. She has never used smokeless tobacco. She reports current alcohol use. She reports that she does not use drugs.   Family History:  The patient's family history includes Hypertension in her mother.   ROS:  Please see the history of present illness.   Otherwise, review of systems is positive for none.   All other systems are reviewed and negative.   PHYSICAL EXAM: VS:  BP 126/70   Pulse 66   Ht 5\' 3"  (1.6 m)   Wt 144 lb 9.6 oz (65.6 kg)   SpO2 94%   BMI 25.61 kg/m  , BMI Body mass index is 25.61 kg/m. GEN: Well nourished, well developed, in no acute distress  HEENT: normal  Neck: no JVD, carotid bruits, or masses Cardiac: RRR; no murmurs, rubs, or gallops,no edema  Respiratory:  clear to  auscultation bilaterally, normal work of breathing GI: soft, nontender, nondistended, + BS MS: no deformity or atrophy  Skin: warm and dry Neuro:  Strength and sensation are intact Psych: euthymic mood, full affect  EKG:  EKG is ordered today. Personal review of the ekg ordered shows sinus rhythm  Recent Labs: No results found for requested labs within last 8760 hours.    Lipid Panel     Component Value Date/Time   CHOL 151 08/22/2008 1042   TRIG 120 08/22/2008 1042   HDL 38.2 (L) 08/22/2008 1042   CHOLHDL 4.0 CALC 08/22/2008 1042   VLDL 24 08/22/2008 1042   LDLCALC 89 08/22/2008 1042  Wt Readings from Last 3 Encounters:  12/25/20 144 lb 9.6 oz (65.6 kg)  09/29/20 143 lb 14.4 oz (65.3 kg)  07/14/20 149 lb (67.6 kg)      Other studies Reviewed: Additional studies/ records that were reviewed today include: Stress echo 07/03/17  Review of the above records today demonstrates:  - Stress ECG conclusions: The stress ECG was normal. [facility] scoring:   exercise time of 6.5 min; maximum ST deviation of 0 mm; no   angina; resulting score is 7. This score predicts a low risk of   cardiac events. - Staged echo: This study is inadequate for the diagnostic   evaluation of left ventricular regional wall motion. - Impressions: Due to very frequent ventricular bigeminy and   salvos, images are of poor quality and cannot rule out wall   motion abnormality with stress. Recommend coronary CTA for   further assessment of calcium score and CAD. May also benefit   from 24 hour Holter monitor to assess PVC load.  07/20/17 Spect  Nuclear stress EF: 72%. The left ventricular ejection fraction is hyperdynamic (>65%).  The study is normal.  This is a low risk study.  Holter 07/26/17 Minimum HR: 41 BPM at 9:56:17 AM(2) Maximum HR: 145 BPM at 10:09:39 AM Average HR: 61 BPM <1% PVCs <1% APCs Multiple runs of wide complex tachycardia Short runs of narrow complex tachycardia  ETT  09/13/17 -personally reviewed  Blood pressure demonstrated a normal response to exercise.  There was no ST segment deviation noted during stress.   No ischemia Rare PVC with exercise Slight QRS widening during stress on flecainide  ASSESSMENT AND PLAN:  1.  Ventricular tachycardia: Currently on flecainide and diltiazem.  High risk medication monitoring.  No further episodes of VT.  She is currently feeling well.  She has had no further palpitations.  2.  Hypertension: Currently well controlled  3.  Hyperlipidemia: Continue atorvastatin    Current medicines are reviewed at length with the patient today.   The patient does not have concerns regarding her medicines.  The following changes were made today: None  Labs/ tests ordered today include:  Orders Placed This Encounter  Procedures  . EKG 12-Lead     Disposition:   FU with Sharlett Lienemann 6 months  Signed, Zyan Mirkin Meredith [place], MD  12/25/2020 8:51 AM     [facility] [address] [place] [address] [phone] (office) [phone] (fax)

## 2021-01-04 ENCOUNTER — Ambulatory Visit: Payer: PRIVATE HEALTH INSURANCE

## 2021-02-12 ENCOUNTER — Other Ambulatory Visit: Payer: Self-pay | Admitting: Cardiology

## 2021-03-19 ENCOUNTER — Emergency Department (HOSPITAL_BASED_OUTPATIENT_CLINIC_OR_DEPARTMENT_OTHER): Payer: PRIVATE HEALTH INSURANCE

## 2021-03-19 ENCOUNTER — Emergency Department (HOSPITAL_BASED_OUTPATIENT_CLINIC_OR_DEPARTMENT_OTHER)
Admission: EM | Admit: 2021-03-19 | Discharge: 2021-03-19 | Disposition: A | Discharge status: Home / Self Care | Payer: PRIVATE HEALTH INSURANCE | Attending: Emergency Medicine | Admitting: Emergency Medicine

## 2021-03-19 ENCOUNTER — Other Ambulatory Visit: Payer: Self-pay

## 2021-03-19 ENCOUNTER — Encounter (HOSPITAL_BASED_OUTPATIENT_CLINIC_OR_DEPARTMENT_OTHER): Payer: Self-pay | Admitting: *Deleted

## 2021-03-19 DIAGNOSIS — Z79899 Other long term (current) drug therapy: Secondary | ICD-10-CM | POA: Insufficient documentation

## 2021-03-19 DIAGNOSIS — R059 Cough, unspecified: Secondary | ICD-10-CM | POA: Diagnosis present

## 2021-03-19 DIAGNOSIS — I10 Essential (primary) hypertension: Secondary | ICD-10-CM | POA: Diagnosis not present

## 2021-03-19 DIAGNOSIS — J101 Influenza due to other identified influenza virus with other respiratory manifestations: Secondary | ICD-10-CM

## 2021-03-19 DIAGNOSIS — J1089 Influenza due to other identified influenza virus with other manifestations: Secondary | ICD-10-CM | POA: Diagnosis not present

## 2021-03-19 DIAGNOSIS — Z87891 Personal history of nicotine dependence: Secondary | ICD-10-CM | POA: Diagnosis not present

## 2021-03-19 DIAGNOSIS — Z853 Personal history of malignant neoplasm of breast: Secondary | ICD-10-CM | POA: Diagnosis not present

## 2021-03-19 MED ORDER — DEXAMETHASONE 6 MG PO TABS
10.0000 mg | ORAL_TABLET | Freq: Once | ORAL | Status: AC
  Administered 2021-03-19: 10 mg via ORAL
  Filled 2021-03-19: qty 1

## 2021-03-19 MED ORDER — KETOROLAC TROMETHAMINE 60 MG/2ML IM SOLN
30.0000 mg | Freq: Once | INTRAMUSCULAR | Status: AC
  Administered 2021-03-19: 30 mg via INTRAMUSCULAR
  Filled 2021-03-19: qty 2

## 2021-03-19 MED ORDER — KETOROLAC TROMETHAMINE 60 MG/2ML IM SOLN: 60.0000 mg | Freq: Once | INTRAMUSCULAR | Status: DC

## 2021-03-19 NOTE — ED Provider Notes (Signed)
[place] [facility] Provider Note   CSN: [id number] Arrival date & time: 03/19/21  2136     History Chief Complaint  Patient presents with  . Shortness of Breath    Andrea Clarke is a 75 y.o. female.  The history is provided by the patient.  URI Presenting symptoms: congestion, cough, fever and sore throat   Presenting symptoms: no ear pain   Severity:  Mild Onset quality:  Gradual Timing:  Intermittent Progression:  Waxing and waning Chronicity:  New Relieved by:  Nothing Worsened by:  Nothing Associated symptoms: no arthralgias   Risk factors comment:  Positive for flu this week      Past Medical History:  Diagnosis Date  . Atrial tachycardia ([place])   . Breast cancer ([place])    left breast cancer 2019  . 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 Allegra Lai)  . GERD (gastroesophageal reflux disease)   . Glaucoma   . Hyperlipidemia   . Hypertension   . Personal history of chemotherapy   . Personal history of radiation therapy    finished 02/11/19  . PONV (postoperative nausea and vomiting)    did well with lumpectomy and port placement    Patient Active Problem List   Diagnosis Date Noted  . Port-A-Cath in place 08/31/2018  . Malignant neoplasm of upper-inner quadrant of left breast in female, estrogen receptor negative ([place]) 08/28/2018  . Hypertension   . Hyperlipidemia   . Glaucoma   . GERD (gastroesophageal reflux disease)   . HYPERLIPIDEMIA 06/11/2007  . GLAUCOMA NOS 06/11/2007  . HTN (hypertension) 06/11/2007  . ALLERGIC RHINITIS 06/11/2007  . GERD 06/11/2007  . HERPES ZOSTER, UNCOMPLICATED 10/19/7251    Past Surgical History:  Procedure Laterality Date  . ABDOMINAL HYSTERECTOMY    . ANKLE SURGERY    . APPENDECTOMY    . BREAST BIOPSY     Left  . BREAST LUMPECTOMY Left 12/05/2018  . BREAST LUMPECTOMY Left 2019  . BREAST LUMPECTOMY WITH RADIOACTIVE SEED AND SENTINEL LYMPH NODE  BIOPSY N/A 12/05/2018   Procedure: LEFT BREAST LUMPECTOMY WITH RADIOACTIVE SEED AND LEFT AXILLARY SENTINEL LYMPH NODE BIOPSY AND BLUE DYE INJECTION;  Surgeon: Rolm Bookbinder, MD;  Location: [place];  Service: General;  Laterality: N/A;  . CATARACT EXTRACTION W/ INTRAOCULAR LENS IMPLANT Bilateral   . PORT-A-CATH REMOVAL N/A 08/27/2019   Procedure: MINOR REMOVAL PORT-A-CATH;  Surgeon: Rolm Bookbinder, MD;  Location: [place];  Service: General;  Laterality: N/A;  . PORTACATH PLACEMENT Right 09/03/2018   Procedure: INSERTION PORT-A-CATH WITH [place];  Surgeon: Rolm Bookbinder, MD;  Location: [place];  Service: General;  Laterality: Right;  . ROTATOR CUFF REPAIR Right   . TONSILLECTOMY       OB History   No obstetric history on file.     Family History  Problem Relation Age of Onset  . Hypertension Mother   . Breast cancer Neg Hx     Social History   Tobacco Use  . Smoking status: Former Research scientist (life sciences)  . Smokeless tobacco: Never Used  Vaping Use  . Vaping Use: Never used  Substance Use Topics  . Alcohol use: Yes    Comment: social  . Drug use: No    Home Medications Prior to Admission medications   Medication Sig Start Date End Date Taking? Authorizing Provider  acetaminophen (TYLENOL) 500 MG tablet Take 1,000 mg by mouth daily as needed for moderate pain or headache.  [provider]  atorvastatin (LIPITOR) 20 MG tablet Take 20 mg by mouth daily.    [provider]  cetirizine (ZYRTEC) 10 MG tablet Take 10 mg by mouth daily.    [provider]  Cholecalciferol (VITAMIN D3) 125 MCG (5000 UT) CAPS Take 5,000 Units by mouth at bedtime.     [provider]  Coenzyme Q10 10 MG capsule Take 10 mg by mouth at bedtime.    [provider]  dexlansoprazole (DEXILANT) 60 MG capsule Take 60 mg by mouth daily.    [provider]  diltiazem (CARTIA XT) 120 MG 24 hr capsule TAKE 1 CAPSULE BY MOUTH  DAILY 08/14/20   Camnitz,  Ocie Doyne, MD  flecainide (TAMBOCOR) 100 MG tablet TAKE 1 TABLET BY MOUTH  TWICE DAILY 02/15/21   Camnitz, Will Hassell Done, MD  fluticasone (FLONASE) 50 MCG/ACT nasal spray Place 1 spray into both nostrils daily as needed for allergies.     [provider]  furosemide (LASIX) 20 MG tablet TAKE 1 TABLET(20 MG) BY MOUTH DAILY 04/11/19   Josue Hector, MD  Krill Oil 500 MG CAPS Take 500 mg by mouth at bedtime.    [provider]  latanoprost (XALATAN) 0.005 % ophthalmic solution Place 1 drop into both eyes at bedtime. 01/28/20   Nicholas Lose, MD  montelukast (SINGULAIR) 10 MG tablet Take 10 mg by mouth daily.    [provider]  Multiple Vitamin (MULTIVITAMIN) tablet Take 1 tablet by mouth at bedtime.     [provider]  Polyethyl Glycol-Propyl Glycol (SYSTANE OP) Place 1 drop into both eyes 2 (two) times daily as needed (dry eyes).    [provider]  quinapril (ACCUPRIL) 20 MG tablet Take 1 tablet (20 mg total) by mouth daily. Please make overdue appt with Dr. Johnsie Cancel before anymore refills. 3rd and Final attempt 06/25/19   Josue Hector, MD  RYBELSUS 3 MG TABS Take 1 tablet by mouth at bedtime. 07/02/20   [provider]    Allergies    Adhesive [tape], Oxycodone hcl, Augmentin [amoxicillin-pot clavulanate], and Xylocaine [lidocaine hcl]  Review of Systems   Review of Systems  Constitutional: Positive for fever. Negative for chills.  HENT: Positive for congestion and sore throat. Negative for ear pain.   Eyes: Negative for pain and visual disturbance.  Respiratory: Positive for cough. Negative for shortness of breath.   Cardiovascular: Negative for chest pain and palpitations.  Gastrointestinal: Negative for abdominal pain and vomiting.  Genitourinary: Negative for dysuria and hematuria.  Musculoskeletal: Negative for arthralgias and back pain.  Skin: Negative for color change and rash.  Neurological: Negative for seizures and syncope.   All other systems reviewed and are negative.   Physical Exam Updated Vital Signs BP 117/80   Pulse 82   Temp 98.4 F (36.9 C) (Oral)   Resp 19   Ht 5\' 3"  (1.6 m)   Wt 65.3 kg   SpO2 96%   BMI 25.51 kg/m   Physical Exam Vitals and nursing note reviewed.  Constitutional:      General: She is not in acute distress.    Appearance: She is well-developed. She is not ill-appearing.  HENT:     Head: Normocephalic and atraumatic.     Mouth/Throat:     Pharynx: No oropharyngeal exudate.  Eyes:     Conjunctiva/sclera: Conjunctivae normal.     Pupils: Pupils are equal, round, and reactive to light.  Cardiovascular:     Rate  and Rhythm: Normal rate and regular rhythm.     Pulses: Normal pulses.     Heart sounds: Normal heart sounds. No murmur heard.   Pulmonary:     Effort: Pulmonary effort is normal. No respiratory distress.     Breath sounds: Normal breath sounds. No decreased breath sounds or wheezing.  Abdominal:     Palpations: Abdomen is soft.     Tenderness: There is no abdominal tenderness.  Musculoskeletal:     Cervical back: Normal range of motion and neck supple.     Right lower leg: No edema.     Left lower leg: No edema.  Skin:    General: Skin is warm and dry.     Capillary Refill: Capillary refill takes less than 2 seconds.  Neurological:     General: No focal deficit present.     Mental Status: She is alert.     ED Results / Procedures / Treatments   Labs (all labs ordered are listed, but only abnormal results are displayed) Labs Reviewed - No data to display  EKG None  Radiology DG Chest Portable 1 View  Result Date: 03/19/2021 CLINICAL DATA:  Cough, shortness of breath EXAM: PORTABLE CHEST 1 VIEW COMPARISON:  09/03/2018 FINDINGS: Heart and mediastinal contours are within normal limits. No focal opacities or effusions. No acute bony abnormality. IMPRESSION: No active cardiopulmonary disease. Electronically Signed   By: Rolm Baptise M.D.   On:  03/19/2021 22:19    Procedures Procedures   Medications Ordered in ED Medications  dexamethasone (DECADRON) tablet 10 mg (10 mg Oral Given 03/19/21 2238)  ketorolac (TORADOL) injection 30 mg (30 mg Intramuscular Given 03/19/21 2238)    ED Course  I have reviewed the triage vital signs and the nursing notes.  Pertinent labs & imaging results that were available during my care of the patient were reviewed by me and considered in my medical decision making (see chart for details).    MDM Rules/Calculators/A&P                          Andrea Clarke is a 75 year old female with history of high cholesterol, hypertension who presents the ED with cough, shortness of breath.  Diagnosed with flu 2 days ago.  Overall clear breath sounds.  Normal vitals.  Chest x-ray shows no evidence of pneumonia or pneumothorax.  Overall suspect that she is having bad flu symptoms.  Given Decadron and Toradol with improvement.  Given reassurance.  Recommend continued use of Tylenol and ibuprofen at home.  Understands return precautions and discharged from the ED in good condition.  This chart was dictated using voice recognition software.  Despite best efforts to proofread,  errors can occur which can change the documentation meaning.    Final Clinical Impression(s) / ED Diagnoses Final diagnoses:  Influenza A    Rx / DC Orders ED Discharge Orders    None       Lennice Sites, DO 03/19/21 2307

## 2021-03-19 NOTE — ED Triage Notes (Signed)
C/o SOB x 3 days , seen by PMD x 1 day ago , FLu + , Covid -, increased SOB and wheezing today .

## 2021-03-19 NOTE — Discharge Instructions (Addendum)
Continue 1000 mg of Tylenol every 6 hours as needed for pain.  Continue 400 mg ibuprofen every 8 hours as needed for pain.  Recommend warm tea and honey for cough.

## 2021-06-17 ENCOUNTER — Encounter: Payer: Self-pay | Admitting: Hematology and Oncology

## 2021-06-18 ENCOUNTER — Other Ambulatory Visit: Payer: Self-pay | Admitting: Hematology and Oncology

## 2021-06-18 ENCOUNTER — Other Ambulatory Visit: Payer: Self-pay | Admitting: Cardiology

## 2021-06-18 DIAGNOSIS — Z9889 Other specified postprocedural states: Secondary | ICD-10-CM

## 2021-06-29 ENCOUNTER — Encounter: Payer: Self-pay | Admitting: Cardiology

## 2021-06-29 ENCOUNTER — Ambulatory Visit (INDEPENDENT_AMBULATORY_CARE_PROVIDER_SITE_OTHER): Payer: No Typology Code available for payment source | Admitting: Cardiology

## 2021-06-29 ENCOUNTER — Other Ambulatory Visit: Payer: Self-pay

## 2021-06-29 VITALS — BP 112/64 | HR 55 | Ht 63.0 in | Wt 145.4 lb

## 2021-06-29 DIAGNOSIS — I472 Ventricular tachycardia, unspecified: Secondary | ICD-10-CM

## 2021-06-29 NOTE — Patient Instructions (Signed)
Medication Instructions:  °Your physician recommends that you continue on your current medications as directed. Please refer to the Current Medication list given to you today. ° °*If you need a refill on your cardiac medications before your next appointment, please call your pharmacy* ° ° °Lab Work: °None ordered ° ° °Testing/Procedures: °None ordered ° ° °Follow-Up: °At [facility], you and your health needs are our priority.  As part of our continuing mission to provide you with exceptional heart care, we have created designated Provider Care Teams.  These Care Teams include your primary Cardiologist (physician) and Advanced Practice Providers (APPs -  Physician Assistants and Nurse Practitioners) who all work together to provide you with the care you need, when you need it. ° °Your next appointment:   °6 month(s) ° °The format for your next appointment:   °In Person ° °Provider:   °Will Camnitz, MD ° ° ° °Thank you for choosing [facility]!! ° ° °Mollyann Halbert, RN °[phone] °  °

## 2021-06-29 NOTE — Progress Notes (Signed)
[place])   Electrophysiology Office Note   Date:  06/29/2021   ID:  Andrea, Clarke 19-Oct-1945, MRN [id number]  PCP:  [name], MD  Cardiologist:  Johnsie Cancel Primary Electrophysiologist:  Shanon Becvar Meredith Leeds, MD    No chief complaint on file.    History of Present Illness: Andrea Clarke is a 75 y.o. female who is being seen today for the evaluation of wide complex tachycardia at the request of [name], MD. Presenting today for electrophysiology evaluation.    She has a history significant for hypertension, hyperlipidemia, and chest pain.  She had a Myoview which was low risk.  She wore a Holter monitor that showed salvos of wide-complex tachycardia up to 19 beats.  Each episode is monomorphic.  She had multiple morphologies.  She also had episodes of SVT.  She has a history of breast cancer status postlumpectomy, chemotherapy, and radiation.  She is currently on flecainide for her ventricular tachycardia.  Today, denies symptoms of palpitations, chest pain, shortness of breath, orthopnea, PND, lower extremity edema, claudication, dizziness, presyncope, syncope, bleeding, or neurologic sequela. The patient is tolerating medications without difficulties.  She currently feels well.  She is unaware of palpitations that she is having them.  She has no chest pain or shortness of breath.  She is able do all of her daily activities.  She had a respiratory illness in June and was treated.  For influenza.  Aside from has no complaints.  She is continuing to exercise.  Past Medical History:  Diagnosis Date   Atrial tachycardia ([place])    Breast cancer ([place])    left breast cancer 2019   Dysrhythmia    WCT 07/2017 Holter monitor, started on flecainide (EP Dr. Allegra Lai)   GERD (gastroesophageal reflux disease)    Glaucoma    Hyperlipidemia    Hypertension    Personal history of chemotherapy    Personal history of radiation therapy    finished 02/11/19   PONV  (postoperative nausea and vomiting)    did well with lumpectomy and port placement   Past Surgical History:  Procedure Laterality Date   ABDOMINAL HYSTERECTOMY     ANKLE SURGERY     APPENDECTOMY     BREAST BIOPSY     Left   BREAST LUMPECTOMY Left 12/05/2018   BREAST LUMPECTOMY Left 2019   BREAST LUMPECTOMY WITH RADIOACTIVE SEED AND SENTINEL LYMPH NODE BIOPSY N/A 12/05/2018   Procedure: LEFT BREAST LUMPECTOMY WITH RADIOACTIVE SEED AND LEFT AXILLARY SENTINEL LYMPH NODE BIOPSY AND BLUE DYE INJECTION;  Surgeon: Rolm Bookbinder, MD;  Location: [place];  Service: General;  Laterality: N/A;   CATARACT EXTRACTION W/ INTRAOCULAR LENS IMPLANT Bilateral    PORT-A-CATH REMOVAL N/A 08/27/2019   Procedure: MINOR REMOVAL PORT-A-CATH;  Surgeon: Rolm Bookbinder, MD;  Location: [facility];  Service: General;  Laterality: N/A;   PORTACATH PLACEMENT Right 09/03/2018   Procedure: INSERTION PORT-A-CATH WITH [place];  Surgeon: Rolm Bookbinder, MD;  Location: [facility];  Service: General;  Laterality: Right;   ROTATOR CUFF REPAIR Right    TONSILLECTOMY       Current Outpatient Medications  Medication Sig Dispense Refill   acetaminophen (TYLENOL) 500 MG tablet Take 1,000 mg by mouth daily as needed for moderate pain or headache.      atorvastatin (LIPITOR) 20 MG tablet Take 20 mg by mouth daily.     cetirizine (ZYRTEC) 10 MG tablet Take 10 mg by mouth daily.  Cholecalciferol (VITAMIN D3) 125 MCG (5000 UT) CAPS Take 5,000 Units by mouth at bedtime.      Coenzyme Q10 10 MG capsule Take 10 mg by mouth at bedtime.     dexlansoprazole (DEXILANT) 60 MG capsule Take 60 mg by mouth daily.     diltiazem (CARDIZEM CD) 120 MG 24 hr capsule TAKE 1 CAPSULE BY MOUTH  DAILY 90 capsule 1   flecainide (TAMBOCOR) 100 MG tablet TAKE 1 TABLET BY MOUTH  TWICE DAILY 180 tablet 3   fluticasone (FLONASE) 50 MCG/ACT nasal spray Place 1 spray into both nostrils daily as needed for allergies.      furosemide (LASIX)  20 MG tablet TAKE 1 TABLET(20 MG) BY MOUTH DAILY 30 tablet 0   Krill Oil 500 MG CAPS Take 500 mg by mouth at bedtime.     latanoprost (XALATAN) 0.005 % ophthalmic solution Place 1 drop into both eyes at bedtime. 2.5 mL 12   montelukast (SINGULAIR) 10 MG tablet Take 10 mg by mouth daily.     Multiple Vitamin (MULTIVITAMIN) tablet Take 1 tablet by mouth at bedtime.      Polyethyl Glycol-Propyl Glycol (SYSTANE OP) Place 1 drop into both eyes 2 (two) times daily as needed (dry eyes).     quinapril (ACCUPRIL) 20 MG tablet Take 1 tablet (20 mg total) by mouth daily. Please make overdue appt with Dr. Johnsie Cancel before anymore refills. 3rd and Final attempt 15 tablet 0   RYBELSUS 3 MG TABS Take 1 tablet by mouth at bedtime.     No current facility-administered medications for this visit.   Facility-Administered Medications Ordered in Other Visits  Medication Dose Route Frequency Provider Last Rate Last Admin   sodium chloride flush (NS) 0.9 % injection 10 mL  10 mL Intracatheter Once Nicholas Lose, MD        Allergies:   Adhesive [tape], Oxycodone hcl, Augmentin [amoxicillin-pot clavulanate], and Xylocaine [lidocaine hcl]   Social History:  The patient  reports that she has quit smoking. She has never used smokeless tobacco. She reports current alcohol use. She reports that she does not use drugs.   Family History:  The patient's family history includes Hypertension in her mother.   ROS:  Please see the history of present illness.   Otherwise, review of systems is positive for none.   All other systems are reviewed and negative.   PHYSICAL EXAM: VS:  BP 112/64   Pulse (!) 55   Ht '5\' 3"'$  (1.6 m)   Wt 145 lb 6.4 oz (66 kg)   SpO2 98%   BMI 25.76 kg/m  , BMI Body mass index is 25.76 kg/m. GEN: Well nourished, well developed, in no acute distress  HEENT: normal  Neck: no JVD, carotid bruits, or masses Cardiac: RRR; no murmurs, rubs, or gallops,no edema  Respiratory:  clear to auscultation  bilaterally, normal work of breathing GI: soft, nontender, nondistended, + BS MS: no deformity or atrophy  Skin: warm and dry Neuro:  Strength and sensation are intact Psych: euthymic mood, full affect  EKG:  EKG is ordered today. Personal review of the ekg ordered shows sinus rhythm  Recent Labs: No results found for requested labs within last 8760 hours.    Lipid Panel     Component Value Date/Time   CHOL 151 08/22/2008 1042   TRIG 120 08/22/2008 1042   HDL 38.2 (L) 08/22/2008 1042   CHOLHDL 4.0 CALC 08/22/2008 1042   VLDL 24 08/22/2008 1042   LDLCALC 89  08/22/2008 1042     Wt Readings from Last 3 Encounters:  06/29/21 145 lb 6.4 oz (66 kg)  03/19/21 144 lb (65.3 kg)  12/25/20 144 lb 9.6 oz (65.6 kg)      Other studies Reviewed: Additional studies/ records that were reviewed today include: Stress echo 07/03/17  Review of the above records today demonstrates:  - Stress ECG conclusions: The stress ECG was normal. [facility] scoring:   exercise time of 6.5 min; maximum ST deviation of 0 mm; no   angina; resulting score is 7. This score predicts a low risk of   cardiac events. - Staged echo: This study is inadequate for the diagnostic   evaluation of left ventricular regional wall motion. - Impressions: Due to very frequent ventricular bigeminy and   salvos, images are of poor quality and cannot rule out wall   motion abnormality with stress. Recommend coronary CTA for   further assessment of calcium score and CAD. May also benefit   from 24 hour Holter monitor to assess PVC load.  07/20/17 Spect Nuclear stress EF: 72%. The left ventricular ejection fraction is hyperdynamic (>65%). The study is normal. This is a low risk study.  Holter 07/26/17 Minimum HR: 41 BPM at 9:56:17 AM(2) Maximum HR: 145 BPM at 10:09:39 AM Average HR: 61 BPM <1% PVCs <1% APCs Multiple runs of wide complex tachycardia Short runs of narrow complex tachycardia  ETT 09/13/17 -personally  reviewed Blood pressure demonstrated a normal response to exercise. There was no ST segment deviation noted during stress.   No ischemia Rare PVC with exercise Slight QRS widening during stress on flecainide  ASSESSMENT AND PLAN:  1.  Ventricular tachycardia: Currently on flecainide 100 mg twice daily, diltiazem 120 mg daily.  High risk medication monitoring.  Fortunately she has had no further episodes.  She currently feels well and is tolerating her flecainide without issue.  2.  Hypertension: Currently on quinapril 20 mg daily.  Currently well controlled.  3.  Hyperlipidemia: Continue atorvastatin 20 mg per primary physician.  Current medicines are reviewed at length with the patient today.   The patient does not have concerns regarding her medicines.  The following changes were made today: None  Labs/ tests ordered today include:  Orders Placed This Encounter  Procedures   EKG 12-Lead      Disposition:   FU with Rayyan Burley 6 months  Signed, Sandro Burgo Meredith Leeds, MD  06/29/2021 8:33 AM     [address] [place] [address] [phone] (office) [phone] (fax)

## 2021-07-31 NOTE — Progress Notes (Signed)
Patient Care Team: Andrea Glee., MD as PCP - General (Internal Medicine) Andrea Haw, MD as PCP - Electrophysiology (Cardiology) Andrea Bookbinder, MD as Consulting Physician (General Surgery) Andrea Lose, MD as Consulting Physician (Hematology and Oncology) Andrea Rudd, MD as Consulting Physician (Radiation Oncology)  DIAGNOSIS:    ICD-10-CM   1. Malignant neoplasm of upper-inner quadrant of left breast in female, estrogen receptor negative ([place])  C50.212    Z17.1       SUMMARY OF ONCOLOGIC HISTORY: Oncology History  Malignant neoplasm of upper-inner quadrant of left breast in female, estrogen receptor negative ([place])  08/17/2018 Initial Diagnosis   Screening mammogram detected abnormality in the left breast by ultrasound irregular mass 10:00 position 1.3 cm, 10 o'clock position no enlarged lymph nodes, Biopsy ([id number]) revealed: [place], grade 3; ER 0%, PR 0%, Ki-67 80%, HER-2 positive 3+ by [facility]   08/28/2018 Cancer Staging   Staging form: Breast, AJCC 8th Edition - Clinical stage from 08/28/2018: Stage IA (cT1c, cN0, cM0, G3, ER-, PR-, HER2+) - Signed by Andrea Lose, MD on 08/28/2018   08/28/2018 Breast MRI   malignancy within the UPPER INNER LEFT breast with dominant mass measuring 2.1 cm. With small adjacent satellite nodules, the entire area 1.5 x 3 x 2.2 cm.No evidence of abnormal lymph nodes or RIGHT breast malignancy.   09/03/2018 -  Chemotherapy   Weekly Taxol and Herceptin followed by maintenance Herceptin for one year.  EF: 60-65% on 06/21/2019   12/05/2018 Surgery   Left lumpectomy Donne Hazel) [phone]): No residual invasive carcinoma, margins negative, 0/2 lymph nodes negative, complete pathologic response [id number]   01/15/2019 - 02/11/2019 Radiation Therapy   The patient initially received a dose of 42.56 Gy in 16 fractions to the left breast using whole-breast tangent fields. This was delivered using a 3-D conformal technique. The patient then  received a boost to the seroma. This delivered an additional 8 Gy in 4 fractions using a 3 field photon technique due to the depth of the seroma. The total dose was 50.56 Gy.     CHIEF COMPLIANT: Follow-up of left breast cancer   INTERVAL HISTORY: Andrea Clarke is a 75 y.o. with above-mentioned history of left breast cancer who underwent neoadjuvant chemotherapy, lumpectomy, radiation, maintenance Herceptin, and is currently on surveillance. She presents to the clinic today for follow-up.  She denies any new symptoms or concerns.  The left sided surgical scar can be tender and nodule can be felt underneath it which is unchanged from before.  ALLERGIES:  is allergic to adhesive [tape], oxycodone hcl, augmentin [amoxicillin-pot clavulanate], and xylocaine [lidocaine hcl].  MEDICATIONS:  Current Outpatient Medications  Medication Sig Dispense Refill   acetaminophen (TYLENOL) 500 MG tablet Take 1,000 mg by mouth daily as needed for moderate pain or headache.      atorvastatin (LIPITOR) 20 MG tablet Take 20 mg by mouth daily.     cetirizine (ZYRTEC) 10 MG tablet Take 10 mg by mouth daily.     Cholecalciferol (VITAMIN D3) 125 MCG (5000 UT) CAPS Take 5,000 Units by mouth at bedtime.      Coenzyme Q10 10 MG capsule Take 10 mg by mouth at bedtime.     dexlansoprazole (DEXILANT) 60 MG capsule Take 60 mg by mouth daily.     diltiazem (CARDIZEM CD) 120 MG 24 hr capsule TAKE 1 CAPSULE BY MOUTH  DAILY 90 capsule 1   flecainide (TAMBOCOR) 100 MG tablet TAKE 1 TABLET BY MOUTH  TWICE DAILY 180 tablet 3  fluticasone (FLONASE) 50 MCG/ACT nasal spray Place 1 spray into both nostrils daily as needed for allergies.      furosemide (LASIX) 20 MG tablet TAKE 1 TABLET(20 MG) BY MOUTH DAILY 30 tablet 0   Krill Oil 500 MG CAPS Take 500 mg by mouth at bedtime.     latanoprost (XALATAN) 0.005 % ophthalmic solution Place 1 drop into both eyes at bedtime. 2.5 mL 12   montelukast (SINGULAIR) 10 MG tablet Take 10  mg by mouth daily.     Multiple Vitamin (MULTIVITAMIN) tablet Take 1 tablet by mouth at bedtime.      Polyethyl Glycol-Propyl Glycol (SYSTANE OP) Place 1 drop into both eyes 2 (two) times daily as needed (dry eyes).     quinapril (ACCUPRIL) 20 MG tablet Take 1 tablet (20 mg total) by mouth daily. Please make overdue appt with Dr. Johnsie Cancel before anymore refills. 3rd and Final attempt 15 tablet 0   RYBELSUS 3 MG TABS Take 1 tablet by mouth at bedtime.     No current facility-administered medications for this visit.   Facility-Administered Medications Ordered in Other Visits  Medication Dose Route Frequency Provider Last Rate Last Admin   sodium chloride flush (NS) 0.9 % injection 10 mL  10 mL Intracatheter Once Andrea Lose, MD        PHYSICAL EXAMINATION: ECOG PERFORMANCE STATUS: 1 - Symptomatic but completely ambulatory  Vitals:   08/02/21 1337  BP: (!) 146/69  Pulse: 66  Resp: 16  Temp: (!) 97.3 F (36.3 C)  SpO2: 98%   Filed Weights   08/02/21 1337  Weight: 145 lb 9.6 oz (66 kg)    BREAST: No palpable masses or nodules in either right or left breasts. No palpable axillary supraclavicular or infraclavicular adenopathy no breast tenderness or nipple discharge. (exam performed in the presence of a chaperone)  LABORATORY DATA:  I have reviewed the data as listed CMP Latest Ref Rng & Units 09/23/2019 07/30/2019 06/18/2019  Glucose 65 - 99 mg/dL 136(H) 137(H) 133(H)  BUN 8 - 27 mg/dL _0 Creatinine 0.57 - 1.00 mg/dL 0.83 0.78 0.75  Sodium 134 - 144 mmol/L 141 139 139  Potassium 3.5 - 5.2 mmol/L 4.2 4.2 4.2  Chloride 96 - 106 mmol/L 101 103 104  CO2 20 - 29 mmol/L _1 Calcium 8.7 - 10.3 mg/dL 9.3 9.1 9.1  Total Protein 6.0 - 8.5 g/dL 6.3 6.9 6.6  Total Bilirubin 0.0 - 1.2 mg/dL 0.5 0.7 0.8  Alkaline Phos 39 - 117 IU/L 116 105 92  AST 0 - 40 IU/L 74(H) 81(H) 94(H)  ALT 0 - 32 IU/L 109(H) 112(H) 128(H)    Lab Results  Component Value Date   WBC 6.5 09/23/2019    HGB 15.1 09/23/2019   HCT 42.5 09/23/2019   MCV 100 (H) 09/23/2019   PLT 152 09/23/2019   NEUTROABS 3.9 07/30/2019    ASSESSMENT & PLAN:  Malignant neoplasm of upper-inner quadrant of left breast in female, estrogen receptor negative ([place]) 08/17/2018:Screening mammogram detected abnormality in the left breast by ultrasound irregular mass 10:00 position 1.3 cm, 10 o'clock position no enlarged lymph nodes, ER 0%, PR 0%, Ki-67 80%, HER-2 +3+ by IHC, grade 3, lymphovascular invasion present, T1c N0 stage Ia clinical stage   12/05/2018: Left lumpectomy: No residual invasive carcinoma, margins negative, 0/2 lymph nodes negative, complete pathologic response [id number] Adjuvant radiation therapy: 01/16/2019-02/11/2019 Herceptin maintenance completed October 2020   Treatment plan: Surveillance   Breast cancer  surveillance: 1.breast exam:  08/02/2021: Benign 2.mammogram 08/10/2020: Benign breast density category B new mammogram scheduled for 08/11/2021  Her daughter does a fundraiser called [employer] for [email or web address].  She attended the dinner event for [employer] last week. Return to clinic in 1 year for follow-up    No orders of the defined types were placed in this encounter.  The patient has a good understanding of the overall plan. she agrees with it. she will call with any problems that may develop before the next visit here.  Total time spent: 20 mins including face to face time and time spent for planning, charting and coordination of care  Rulon Eisenmenger, MD, MPH 08/02/2021  I, Thana Ates, am acting as scribe for Dr. Nicholas Clarke.  I have reviewed the above documentation for accuracy and completeness, and I agree with the above.

## 2021-08-02 ENCOUNTER — Inpatient Hospital Stay
Payer: No Typology Code available for payment source | Attending: Hematology and Oncology | Admitting: Hematology and Oncology

## 2021-08-02 ENCOUNTER — Other Ambulatory Visit: Payer: Self-pay

## 2021-08-02 DIAGNOSIS — Z853 Personal history of malignant neoplasm of breast: Secondary | ICD-10-CM | POA: Diagnosis present

## 2021-08-02 DIAGNOSIS — C50212 Malignant neoplasm of upper-inner quadrant of left female breast: Secondary | ICD-10-CM | POA: Diagnosis not present

## 2021-08-02 DIAGNOSIS — Z171 Estrogen receptor negative status [ER-]: Secondary | ICD-10-CM

## 2021-08-02 DIAGNOSIS — Z9221 Personal history of antineoplastic chemotherapy: Secondary | ICD-10-CM | POA: Diagnosis not present

## 2021-08-02 DIAGNOSIS — Z923 Personal history of irradiation: Secondary | ICD-10-CM | POA: Insufficient documentation

## 2021-08-02 NOTE — Assessment & Plan Note (Signed)
08/17/2018:Screening mammogram detected abnormality in the left breast by ultrasound irregular mass 10:00 position 1.3 cm, 10 o'clock position no enlarged lymph nodes, ER 0%, PR 0%, Ki-67 80%, HER-2 +3+ by IHC, grade 3, lymphovascular invasion present, T1c N0 stage Ia clinical stage  12/05/2018:Left lumpectomy: No residual invasive carcinoma, margins negative, 0/2 lymph nodes negative, complete pathologic response [id number] Adjuvant radiation therapy: 01/16/2019-02/11/2019 Herceptin maintenance completed October 2020  Treatment plan:Surveillance  Breast cancer surveillance: 1.breast exam:  08/02/2021: Benign 2.mammogram 08/10/2020: Benign breast density category B new mammogram scheduled for 08/11/2021  Return to clinic in 1 year for follow-up

## 2021-08-11 ENCOUNTER — Ambulatory Visit
Admission: RE | Admit: 2021-08-11 | Discharge: 2021-08-11 | Disposition: A | Discharge status: Home / Self Care | Payer: No Typology Code available for payment source | Source: Ambulatory Visit | Attending: Hematology and Oncology | Admitting: Hematology and Oncology

## 2021-08-11 ENCOUNTER — Other Ambulatory Visit: Payer: Self-pay

## 2021-08-11 DIAGNOSIS — Z9889 Other specified postprocedural states: Secondary | ICD-10-CM

## 2021-12-10 ENCOUNTER — Encounter: Payer: Self-pay | Admitting: Hematology and Oncology

## 2021-12-23 ENCOUNTER — Ambulatory Visit: Payer: Self-pay | Admitting: Cardiology

## 2022-01-15 ENCOUNTER — Other Ambulatory Visit: Payer: Self-pay | Admitting: Cardiology

## 2022-02-14 ENCOUNTER — Ambulatory Visit (INDEPENDENT_AMBULATORY_CARE_PROVIDER_SITE_OTHER): Payer: PRIVATE HEALTH INSURANCE | Admitting: Cardiology

## 2022-02-14 ENCOUNTER — Encounter: Payer: Self-pay | Admitting: Cardiology

## 2022-02-14 ENCOUNTER — Other Ambulatory Visit: Payer: Self-pay

## 2022-02-14 VITALS — BP 134/82 | HR 62 | Ht 63.0 in | Wt 148.0 lb

## 2022-02-14 DIAGNOSIS — I472 Ventricular tachycardia, unspecified: Secondary | ICD-10-CM | POA: Diagnosis not present

## 2022-02-14 MED ORDER — LISINOPRIL 20 MG PO TABS: 20.0000 mg | ORAL_TABLET | Freq: Every day | ORAL | 6 refills | Status: DC

## 2022-02-14 NOTE — Progress Notes (Signed)
? ?Electrophysiology Office Note ? ? ?Date:  02/14/2022  ? ?ID:  Andrea Clarke, DOB [birth year], MRN [id number] ? ?PCP:  [name], MD  ?Cardiologist:  Johnsie Cancel ?Primary Electrophysiologist:  Delmi Fulfer Meredith Leeds, MD   ? ?No chief complaint on file. ? ? ?  ?History of Present Illness: ?Andrea Clarke is a 76 y.o. female who is being seen today for the evaluation of wide complex tachycardia at the request of [name], MD. Presenting today for electrophysiology evaluation.   ? ?She has a history significant for hypertension, hyperlipidemia, chest pain.  She had a Myoview which was read as low risk.  She wore a Holter monitor that showed salvos of wide-complex tachycardia up to 19 beats.  8 episode was monomorphic.  She also had episodes of SVT.  She has a history of breast cancer status postlumpectomy, chemotherapy, radiation.  She is currently on flecainide for ventricular tachycardia. ? ?Today, denies symptoms of palpitations, chest pain, shortness of breath, orthopnea, PND, lower extremity edema, claudication, dizziness, presyncope, syncope, bleeding, or neurologic sequela. The patient is tolerating medications without difficulties.  Since being seen she has done well.  She has noted no further episodes of tachypalpitations.  She is tolerating her flecainide without issue.  She is continuing to go to the gym, exercising without issue. ? ?Past Medical History:  ?Diagnosis Date  ? Atrial tachycardia ([place])   ? Breast cancer ([place])   ? left breast cancer 2019  ? Dysrhythmia   ? WCT 07/2017 Holter monitor, started on flecainide (EP Dr. Allegra Lai)  ? GERD (gastroesophageal reflux disease)   ? Glaucoma   ? Hyperlipidemia   ? Hypertension   ? Personal history of chemotherapy   ? Personal history of radiation therapy   ? finished 02/11/19  ? PONV (postoperative nausea and vomiting)   ? did well with lumpectomy and port placement  ? ?Past Surgical History:  ?Procedure Laterality Date  ? ABDOMINAL  HYSTERECTOMY    ? ANKLE SURGERY    ? APPENDECTOMY    ? BREAST BIOPSY    ? Left  ? BREAST LUMPECTOMY Left 12/05/2018  ? BREAST LUMPECTOMY Left 2019  ? BREAST LUMPECTOMY WITH RADIOACTIVE SEED AND SENTINEL LYMPH NODE BIOPSY N/A 12/05/2018  ? Procedure: LEFT BREAST LUMPECTOMY WITH RADIOACTIVE SEED AND LEFT AXILLARY SENTINEL LYMPH NODE BIOPSY AND BLUE DYE INJECTION;  Surgeon: Rolm Bookbinder, MD;  Location: [facility];  Service: General;  Laterality: N/A;  ? CATARACT EXTRACTION W/ INTRAOCULAR LENS IMPLANT Bilateral   ? PORT-A-CATH REMOVAL N/A 08/27/2019  ? Procedure: MINOR REMOVAL PORT-A-CATH;  Surgeon: Rolm Bookbinder, MD;  Location: [facility];  Service: General;  Laterality: N/A;  ? PORTACATH PLACEMENT Right 09/03/2018  ? Procedure: INSERTION PORT-A-CATH WITH [place];  Surgeon: Rolm Bookbinder, MD;  Location: [facility];  Service: General;  Laterality: Right;  ? ROTATOR CUFF REPAIR Right   ? TONSILLECTOMY    ? ? ? ?Current Outpatient Medications  ?Medication Sig Dispense Refill  ? acetaminophen (TYLENOL) 500 MG tablet Take 1,000 mg by mouth daily as needed for moderate pain or headache.     ? atorvastatin (LIPITOR) 20 MG tablet Take 20 mg by mouth daily.    ? cetirizine (ZYRTEC) 10 MG tablet Take 10 mg by mouth daily.    ? Cholecalciferol (VITAMIN D3) 125 MCG (5000 UT) CAPS Take 5,000 Units by mouth at bedtime.     ? Coenzyme Q10 10 MG capsule Take 10 mg by mouth at bedtime.    ?  dexlansoprazole (DEXILANT) 60 MG capsule Take 60 mg by mouth daily.    ? diltiazem (CARDIZEM CD) 120 MG 24 hr capsule TAKE 1 CAPSULE BY MOUTH  DAILY 90 capsule 1  ? flecainide (TAMBOCOR) 100 MG tablet TAKE 1 TABLET BY MOUTH  TWICE DAILY 180 tablet 0  ? fluticasone (FLONASE) 50 MCG/ACT nasal spray Place 1 spray into both nostrils daily as needed for allergies.     ? furosemide (LASIX) 20 MG tablet TAKE 1 TABLET(20 MG) BY MOUTH DAILY 30 tablet 0  ? Krill Oil 500 MG CAPS Take 500 mg by mouth at bedtime.    ? montelukast (SINGULAIR) 10 MG  tablet Take 10 mg by mouth daily.    ? Multiple Vitamin (MULTIVITAMIN) tablet Take 1 tablet by mouth at bedtime.     ? Polyethyl Glycol-Propyl Glycol (SYSTANE OP) Place 1 drop into both eyes 2 (two) times daily as needed (dry eyes).    ? Semaglutide 7 MG TABS Take 7 mg by mouth daily.    ? ?No current facility-administered medications for this visit.  ? ?Facility-Administered Medications Ordered in Other Visits  ?Medication Dose Route Frequency Provider Last Rate Last Admin  ? sodium chloride flush (NS) 0.9 % injection 10 mL  10 mL Intracatheter Once Nicholas Lose, MD      ? ? ?Allergies:   Adhesive [tape], Oxycodone hcl, Augmentin [amoxicillin-pot clavulanate], and Xylocaine [lidocaine hcl]  ? ?Social History:  The patient  reports that she has quit smoking. She has never used smokeless tobacco. She reports current alcohol use. She reports that she does not use drugs.  ? ?Family History:  The patient's family history includes Atrial fibrillation in her mother; Diabetes in her maternal grandfather; Heart disease in her maternal grandmother and paternal grandfather; Hypertension in her maternal grandmother and mother; Rheum arthritis in her maternal grandmother.  ? ?ROS:  Please see the history of present illness.   Otherwise, review of systems is positive for none.   All other systems are reviewed and negative.  ? ?PHYSICAL EXAM: ?VS:  BP 134/82   Pulse 62   Ht '5\' 3"'$  (1.6 m)   Wt 148 lb (67.1 kg)   SpO2 97%   BMI 26.22 kg/m?  , BMI Body mass index is 26.22 kg/m?. ?GEN: Well nourished, well developed, in no acute distress  ?HEENT: normal  ?Neck: no JVD, carotid bruits, or masses ?Cardiac: RRR; no murmurs, rubs, or gallops,no edema  ?Respiratory:  clear to auscultation bilaterally, normal work of breathing ?GI: soft, nontender, nondistended, + BS ?MS: no deformity or atrophy  ?Skin: warm and dry ?Neuro:  Strength and sensation are intact ?Psych: euthymic mood, full affect ? ?EKG:  EKG is ordered today. ?Personal  review of the ekg ordered shows sinus rhythm, rate 62 ? ?Recent Labs: ?No results found for requested labs within last 8760 hours.  ? ? ?Lipid Panel  ?   ?Component Value Date/Time  ? CHOL 151 08/22/2008 1042  ? TRIG 120 08/22/2008 1042  ? HDL 38.2 (L) 08/22/2008 1042  ? CHOLHDL 4.0 CALC 08/22/2008 1042  ? VLDL 24 08/22/2008 1042  ? [place] 89 08/22/2008 1042  ? ? ? ?Wt Readings from Last 3 Encounters:  ?02/14/22 148 lb (67.1 kg)  ?08/02/21 145 lb 9.6 oz (66 kg)  ?06/29/21 145 lb 6.4 oz (66 kg)  ?  ? ? ?Other studies Reviewed: ?Additional studies/ records that were reviewed today include: TTE 06/21/19 ?Review of the above records today demonstrates:  ? 1.  The left ventricle has normal systolic function with an ejection  ?fraction of 60-65%. The cavity size was normal. Left ventricular diastolic  ?Doppler parameters are consistent with impaired relaxation.  ? 2. The right ventricle has normal systolic function. The cavity was  ?normal. There is no increase in right ventricular wall thickness.  ? 3. The aortic valve is tricuspid. Mild thickening of the aortic valve.  ?Mild calcification of the aortic valve. Aortic valve regurgitation is  ?trivial by color flow Doppler.  ? 4. The aorta is normal unless otherwise noted.  ? 5. The average left ventricular global longitudinal strain is -24.1 %.  ? ?07/20/17 Spect ?Nuclear stress EF: 72%. The left ventricular ejection fraction is hyperdynamic (>65%). ?The study is normal. ?This is a low risk study. ? ?Holter 07/26/17 ?Minimum HR: 41 BPM at 9:56:17 AM(2) ?Maximum HR: 145 BPM at 10:09:39 AM ?Average HR: 61 BPM ?<1% PVCs ?<1% APCs ?Multiple runs of wide complex tachycardia ?Short runs of narrow complex tachycardia ? ?ETT 09/13/17 -personally reviewed ?Blood pressure demonstrated a normal response to exercise. ?There was no ST segment deviation noted during stress. ?  ?No ischemia ?Rare PVC with exercise ?Slight QRS widening during stress on flecainide ? ?ASSESSMENT AND  PLAN: ? ?1.  Ventricular tachycardia: Currently on flecainide 100 mg twice daily, diltiazem 120 mg daily.  High risk medication monitoring for flecainide.  Further episodes.  Her QRS has remained stable.  No changes a

## 2022-02-14 NOTE — Patient Instructions (Signed)
Medication Instructions:  ?Your physician has recommended you make the following change in your medication:  ?STOP Quinapril ?START Lisinopril 20 mg once daily ? ?*If you need a refill on your cardiac medications before your next appointment, please call your pharmacy* ? ? ?Lab Work: ?None ordered ? ? ? ?Testing/Procedures: ?None ordered ? ? ?Follow-Up: ?At [facility], you and your health needs are our priority.  As part of our continuing mission to provide you with exceptional heart care, we have created designated Provider Care Teams.  These Care Teams include your primary Cardiologist (physician) and Advanced Practice Providers (APPs -  Physician Assistants and Nurse Practitioners) who all work together to provide you with the care you need, when you need it. ? ?We recommend signing up for the patient portal called "MyChart".  Sign up information is provided on this After Visit Summary.  MyChart is used to connect with patients for Virtual Visits (Telemedicine).  Patients are able to view lab/test results, encounter notes, upcoming appointments, etc.  Non-urgent messages can be sent to your provider as well.   ?To learn more about what you can do with MyChart, go to [email or web address].   ? ?Your next appointment:   ?6 month(s) ? ?The format for your next appointment:   ?In Person ? ?Provider:   ?You will see one of the following Advanced Practice Providers on your designated Care Team:   ?Tommye Standard, PA-C ?Legrand Como "Jonni Sanger" [place]ham, PA-C ? ? ? ?Thank you for choosing [facility]!! ? ? ?Trinidad Curet, RN ?[phone] ? ?

## 2022-02-14 NOTE — Progress Notes (Signed)
Opened in error

## 2022-03-15 ENCOUNTER — Other Ambulatory Visit: Payer: Self-pay

## 2022-03-15 MED ORDER — DILTIAZEM HCL ER COATED BEADS 120 MG PO CP24: 120.0000 mg | ORAL_CAPSULE | Freq: Every day | ORAL | 3 refills | Status: DC

## 2022-03-15 NOTE — Telephone Encounter (Signed)
Pt's medication was sent to pt's pharmacy as requested. Confirmation received.  °

## 2022-04-08 ENCOUNTER — Other Ambulatory Visit: Payer: Self-pay | Admitting: Cardiology

## 2022-04-16 ENCOUNTER — Other Ambulatory Visit: Payer: Self-pay | Admitting: Nurse Practitioner

## 2022-04-18 ENCOUNTER — Telehealth: Payer: Self-pay | Admitting: Orthopedic Surgery

## 2022-04-18 NOTE — Telephone Encounter (Signed)
Patient stated she fell while out of town and saw dr there said she needs to follow up within the next 10 days (by the 13th) she has x rays with her, already scheduled an appt for the 17th at 2:00, patient would like to be worked in please advise if needed

## 2022-04-20 NOTE — Telephone Encounter (Signed)
IC and scheduled

## 2022-04-27 ENCOUNTER — Encounter: Payer: Self-pay | Admitting: Orthopedic Surgery

## 2022-04-27 ENCOUNTER — Ambulatory Visit (INDEPENDENT_AMBULATORY_CARE_PROVIDER_SITE_OTHER): Payer: PRIVATE HEALTH INSURANCE | Admitting: Orthopedic Surgery

## 2022-04-27 ENCOUNTER — Ambulatory Visit: Payer: Self-pay

## 2022-04-27 DIAGNOSIS — S4991XA Unspecified injury of right shoulder and upper arm, initial encounter: Secondary | ICD-10-CM

## 2022-04-27 DIAGNOSIS — S42031A Displaced fracture of lateral end of right clavicle, initial encounter for closed fracture: Secondary | ICD-10-CM | POA: Diagnosis not present

## 2022-04-27 NOTE — Progress Notes (Addendum)
Office Visit Note   Patient: Andrea Clarke           Date of Birth: 08-21-1946           MRN: [id number] Visit Date: 04/27/2022 Requested by: [name], MD [address] [place],  [place] [address] PCP: [name], MD  Subjective: Chief Complaint  Patient presents with   Other    Right clavicle injury DOI 04/17/22    HPI: R is a 76 year old patient with right shoulder pain.  Imaging 7-23.  She tripped and [place] the [place].  She was wearing flip-flops at the time.  This was more of an impact injury on the railing.  She has had prior rotator cuff surgery on that side.  Takes oxycodone at nighttime only and Advil during the day.  She has been trying to work.  She works as a Writer.              ROS: All systems reviewed are negative as they relate to the chief complaint within the history of present illness.  Patient denies  fevers or chills.   Assessment & Plan: Visit Diagnoses:  1. Injury of right shoulder, initial encounter     Plan: Impression is right distal clavicle fracture with mild displacement but no indication for operative treatment at this time.  Fracture fragments have remained generally in the same position compared to initial injury radiographs.  She is seen back in 11 days for repeat radiographs and clinical assessment to determine how stable the fracture is.  We will likely keep her in that shoulder immobilizer for 1 more week after that before starting range of motion exercises.  Follow-Up Instructions: Return in about 2 weeks (around 05/11/2022).   Orders:  Orders Placed This Encounter  Procedures   XR Clavicle Right   No orders of the defined types were placed in this encounter.     Procedures: No procedures performed   Clinical Data: No additional findings.  Objective: Vital Signs: There were no vitals taken for this visit.  Physical Exam:   Constitutional: Patient appears  well-developed HEENT:  Head: Normocephalic Eyes:EOM are normal Neck: Normal range of motion Cardiovascular: Normal rate Pulmonary/chest: Effort normal Neurologic: Patient is alert Skin: Skin is warm Psychiatric: Patient has normal mood and affect   Ortho Exam: Ortho exam demonstrates intact EPL FPL interosseous function with palpable radial pulse.  No masses lymphadenopathy or skin changes noted around that right elbow region but she does have some swelling in the right shoulder region consistent with her known fracture.  Deltoid is functional.  I do not detect much in the way of fragment motion with range of motion exercises.  I did move the arm too much but the fragments did not feel like he had any mobility to palpation.  Rotator cuff assessment difficult due to recent fracture.  Specialty Comments:  No specialty comments available.  Imaging: No results found.   PMFS History: Patient Active Problem List   Diagnosis Date Noted   Port-A-Cath in place 08/31/2018   Malignant neoplasm of upper-inner quadrant of left breast in female, estrogen receptor negative ([place]) 08/28/2018   Hypertension    Hyperlipidemia    Glaucoma    GERD (gastroesophageal reflux disease)    HYPERLIPIDEMIA 06/11/2007   GLAUCOMA NOS 06/11/2007   HTN (hypertension) 06/11/2007   ALLERGIC RHINITIS 06/11/2007   GERD 06/11/2007   HERPES ZOSTER, UNCOMPLICATED 71/69/6789  Past Medical History:  Diagnosis Date   Atrial tachycardia ([place])    Breast cancer ([place])    left breast cancer 2019   Dysrhythmia    WCT 07/2017 Holter monitor, started on flecainide (EP Dr. Allegra Lai)   GERD (gastroesophageal reflux disease)    Glaucoma    Hyperlipidemia    Hypertension    Personal history of chemotherapy    Personal history of radiation therapy    finished 02/11/19   PONV (postoperative nausea and vomiting)    did well with lumpectomy and port placement    Family History  Problem Relation Age of Onset    Hypertension Mother    Atrial fibrillation Mother    Heart disease Maternal Grandmother    Hypertension Maternal Grandmother    Rheum arthritis Maternal Grandmother    Diabetes Maternal Grandfather    Heart disease Paternal Grandfather    Breast cancer Neg Hx     Past Surgical History:  Procedure Laterality Date   ABDOMINAL HYSTERECTOMY     ANKLE SURGERY     APPENDECTOMY     BREAST BIOPSY     Left   BREAST LUMPECTOMY Left 12/05/2018   BREAST LUMPECTOMY Left 2019   BREAST LUMPECTOMY WITH RADIOACTIVE SEED AND SENTINEL LYMPH NODE BIOPSY N/A 12/05/2018   Procedure: LEFT BREAST LUMPECTOMY WITH RADIOACTIVE SEED AND LEFT AXILLARY SENTINEL LYMPH NODE BIOPSY AND BLUE DYE INJECTION;  Surgeon: Rolm Bookbinder, MD;  Location: [facility];  Service: General;  Laterality: N/A;   CATARACT EXTRACTION W/ INTRAOCULAR LENS IMPLANT Bilateral    PORT-A-CATH REMOVAL N/A 08/27/2019   Procedure: MINOR REMOVAL PORT-A-CATH;  Surgeon: Rolm Bookbinder, MD;  Location: [facility];  Service: General;  Laterality: N/A;   PORTACATH PLACEMENT Right 09/03/2018   Procedure: [place] WITH [place];  Surgeon: Rolm Bookbinder, MD;  Location: [facility];  Service: General;  Laterality: Right;   ROTATOR CUFF REPAIR Right    TONSILLECTOMY     Social History   Occupational History   Not on file  Tobacco Use   Smoking status: Former   Smokeless tobacco: Never  Vaping Use   Vaping Use: Never used  Substance and Sexual Activity   Alcohol use: Yes    Comment: social   Drug use: No   Sexual activity: Not on file

## 2022-04-29 ENCOUNTER — Emergency Department (HOSPITAL_BASED_OUTPATIENT_CLINIC_OR_DEPARTMENT_OTHER): Payer: PRIVATE HEALTH INSURANCE

## 2022-04-29 ENCOUNTER — Encounter (HOSPITAL_BASED_OUTPATIENT_CLINIC_OR_DEPARTMENT_OTHER): Payer: Self-pay | Admitting: Emergency Medicine

## 2022-04-29 ENCOUNTER — Emergency Department (HOSPITAL_BASED_OUTPATIENT_CLINIC_OR_DEPARTMENT_OTHER)
Admission: EM | Admit: 2022-04-29 | Discharge: 2022-04-29 | Disposition: A | Discharge status: Home / Self Care | Payer: PRIVATE HEALTH INSURANCE | Attending: Emergency Medicine | Admitting: Emergency Medicine

## 2022-04-29 ENCOUNTER — Other Ambulatory Visit: Payer: Self-pay

## 2022-04-29 DIAGNOSIS — Z79899 Other long term (current) drug therapy: Secondary | ICD-10-CM | POA: Diagnosis not present

## 2022-04-29 DIAGNOSIS — K5900 Constipation, unspecified: Secondary | ICD-10-CM | POA: Diagnosis not present

## 2022-04-29 DIAGNOSIS — R109 Unspecified abdominal pain: Secondary | ICD-10-CM | POA: Diagnosis present

## 2022-04-29 LAB — CBC WITH DIFFERENTIAL/PLATELET
Abs Immature Granulocytes: 0.05 10*3/uL (ref 0.00–0.07)
Basophils Absolute: 0 10*3/uL (ref 0.0–0.1)
Basophils Relative: 0 %
Eosinophils Absolute: 0 10*3/uL (ref 0.0–0.5)
Eosinophils Relative: 0 %
HCT: 41.9 % (ref 36.0–46.0)
Hemoglobin: 15.1 g/dL — ABNORMAL HIGH (ref 12.0–15.0)
Immature Granulocytes: 1 %
Lymphocytes Relative: 9 %
Lymphs Abs: 1 10*3/uL (ref 0.7–4.0)
MCH: 35.4 pg — ABNORMAL HIGH (ref 26.0–34.0)
MCHC: 36 g/dL (ref 30.0–36.0)
MCV: 98.1 fL (ref 80.0–100.0)
Monocytes Absolute: 0.6 10*3/uL (ref 0.1–1.0)
Monocytes Relative: 5 %
Neutro Abs: 9.1 10*3/uL — ABNORMAL HIGH (ref 1.7–7.7)
Neutrophils Relative %: 85 %
Platelets: 192 10*3/uL (ref 150–400)
RBC: 4.27 MIL/uL (ref 3.87–5.11)
RDW: 11.9 % (ref 11.5–15.5)
WBC: 10.7 10*3/uL — ABNORMAL HIGH (ref 4.0–10.5)
nRBC: 0 % (ref 0.0–0.2)

## 2022-04-29 LAB — COMPREHENSIVE METABOLIC PANEL
ALT: 38 U/L (ref 0–44)
AST: 33 U/L (ref 15–41)
Albumin: 4.5 g/dL (ref 3.5–5.0)
Alkaline Phosphatase: 98 U/L (ref 38–126)
Anion gap: 10 (ref 5–15)
BUN: 17 mg/dL (ref 8–23)
CO2: 25 mmol/L (ref 22–32)
Calcium: 9.5 mg/dL (ref 8.9–10.3)
Chloride: 100 mmol/L (ref 98–111)
Creatinine, Ser: 0.93 mg/dL (ref 0.44–1.00)
GFR, Estimated: >60 mL/min (ref >60)
Glucose, Bld: 196 mg/dL — ABNORMAL HIGH (ref 70–99)
Potassium: 4.5 mmol/L (ref 3.5–5.1)
Sodium: 135 mmol/L (ref 135–145)
Total Bilirubin: 0.9 mg/dL (ref 0.3–1.2)
Total Protein: 7.4 g/dL (ref 6.5–8.1)

## 2022-04-29 LAB — URINALYSIS, ROUTINE W REFLEX MICROSCOPIC
Bilirubin Urine: NEGATIVE
Glucose, UA: NEGATIVE mg/dL
Hgb urine dipstick: NEGATIVE
Ketones, ur: NEGATIVE mg/dL
Leukocytes,Ua: NEGATIVE
Nitrite: NEGATIVE
Protein, ur: 30 mg/dL — AB
Specific Gravity, Urine: >=1.03 (ref 1.005–1.030)
pH: 5 (ref 5.0–8.0)

## 2022-04-29 LAB — URINALYSIS, MICROSCOPIC (REFLEX)
RBC / HPF: NONE SEEN RBC/hpf (ref 0–5)
WBC, UA: NONE SEEN WBC/hpf (ref 0–5)

## 2022-04-29 LAB — LIPASE, BLOOD: Lipase: 38 U/L (ref 11–51)

## 2022-04-29 MED ORDER — POLYETHYLENE GLYCOL 3350 17 G PO PACK: 17.0000 g | PACK | Freq: Every day | ORAL | 0 refills | Status: DC

## 2022-04-29 NOTE — ED Provider Notes (Signed)
[facility] EMERGENCY DEPARTMENT Provider Note   CSN: [id number] Arrival date & time: 04/29/22  2029     History  Chief Complaint  Patient presents with   Fecal Impaction    Andrea Clarke is a 76 y.o. female.  Patient here with abdominal pain/rectal pain.  Feels like she has stool impaction.  She has been on opioids for right collarbone fracture.  She is also on some other medications that cause constipation.  She tried Dulcolax and enema without much improvement.  She was able to digitally remove some stool this morning.  She denies any nausea or vomiting.  She has had hysterectomy in the past.  She is not had a bowel obstruction.  Nothing has made it better or worse.  The history is provided by the patient.       Home Medications Prior to Admission medications   Medication Sig Start Date End Date Taking? Authorizing Provider  polyethylene glycol (MIRALAX / GLYCOLAX) 17 g packet Take 17 g by mouth daily. 04/29/22  Yes Makayla Lanter, DO  acetaminophen (TYLENOL) 500 MG tablet Take 1,000 mg by mouth daily as needed for moderate pain or headache.     [provider]  atorvastatin (LIPITOR) 20 MG tablet Take 20 mg by mouth daily.    [provider]  cetirizine (ZYRTEC) 10 MG tablet Take 10 mg by mouth daily.    [provider]  Cholecalciferol (VITAMIN D3) 125 MCG (5000 UT) CAPS Take 5,000 Units by mouth at bedtime.     [provider]  Coenzyme Q10 10 MG capsule Take 10 mg by mouth at bedtime.    [provider]  dexlansoprazole (DEXILANT) 60 MG capsule Take 60 mg by mouth daily.    [provider]  diltiazem (CARDIZEM CD) 120 MG 24 hr capsule Take 1 capsule (120 mg total) by mouth daily. 03/15/22   Camnitz, Will Hassell Done, MD  flecainide (TAMBOCOR) 100 MG tablet TAKE 1 TABLET BY MOUTH TWICE  DAILY 04/11/22   Camnitz, Ocie Doyne, MD  fluticasone [facility]) 50 MCG/ACT nasal spray Place 1 spray into both nostrils daily  as needed for allergies.     [provider]  furosemide (LASIX) 20 MG tablet TAKE 1 TABLET(20 MG) BY MOUTH DAILY 04/11/19   Josue Hector, MD  Krill Oil 500 MG CAPS Take 500 mg by mouth at bedtime.    [provider]  lisinopril (ZESTRIL) 20 MG tablet Take 1 tablet (20 mg total) by mouth daily. 02/14/22   Camnitz, Will Hassell Done, MD  montelukast (SINGULAIR) 10 MG tablet Take 10 mg by mouth daily.    [provider]  Multiple Vitamin (MULTIVITAMIN) tablet Take 1 tablet by mouth at bedtime.     [provider]  Polyethyl Glycol-Propyl Glycol (SYSTANE OP) Place 1 drop into both eyes 2 (two) times daily as needed (dry eyes).    [provider]      Allergies    Adhesive [tape], Oxycodone hcl, Augmentin [amoxicillin-pot clavulanate], and Xylocaine [lidocaine hcl]    Review of Systems   Review of Systems  Physical Exam Updated Vital Signs BP (!) 152/85 (BP Location: Left Arm)   Pulse 87   Temp 98.1 F (36.7 C) (Oral)   Resp 20   Ht '5\' 3"'$  (1.6 m)   Wt 64.4 kg   SpO2 99%   BMI 25.15 kg/m  Physical Exam Vitals and nursing note reviewed.  Constitutional:      General: She is  not in acute distress.    Appearance: She is well-developed. She is not ill-appearing.  HENT:     Head: Normocephalic and atraumatic.     Nose: Nose normal.     Mouth/Throat:     Mouth: Mucous membranes are moist.  Eyes:     Extraocular Movements: Extraocular movements intact.     Conjunctiva/sclera: Conjunctivae normal.     Pupils: Pupils are equal, round, and reactive to light.  Cardiovascular:     Rate and Rhythm: Normal rate and regular rhythm.     Pulses: Normal pulses.     Heart sounds: Normal heart sounds. No murmur heard. Pulmonary:     Effort: Pulmonary effort is normal. No respiratory distress.     Breath sounds: Normal breath sounds.  Abdominal:     Palpations: Abdomen is soft.     Tenderness: There is abdominal tenderness.  Musculoskeletal:         General: No swelling.     Cervical back: Normal range of motion and neck supple.  Skin:    General: Skin is warm and dry.     Capillary Refill: Capillary refill takes less than 2 seconds.  Neurological:     Mental Status: She is alert.  Psychiatric:        Mood and Affect: Mood normal.     ED Results / Procedures / Treatments   Labs (all labs ordered are listed, but only abnormal results are displayed) Labs Reviewed  CBC WITH DIFFERENTIAL/PLATELET - Abnormal; Notable for the following components:      Result Value   WBC 10.7 (*)    Hemoglobin 15.1 (*)    MCH 35.4 (*)    Neutro Abs 9.1 (*)    All other components within normal limits  COMPREHENSIVE METABOLIC PANEL - Abnormal; Notable for the following components:   Glucose, Bld 196 (*)    All other components within normal limits  URINALYSIS, ROUTINE W REFLEX MICROSCOPIC - Abnormal; Notable for the following components:   Protein, ur 30 (*)    All other components within normal limits  URINALYSIS, MICROSCOPIC (REFLEX) - Abnormal; Notable for the following components:   Bacteria, UA RARE (*)    All other components within normal limits  LIPASE, BLOOD    EKG None  Radiology CT ABDOMEN PELVIS WO CONTRAST  Result Date: 04/29/2022 CLINICAL DATA:  Constipation, fecal impaction, abdominal pain EXAM: CT ABDOMEN AND PELVIS WITHOUT CONTRAST TECHNIQUE: Multidetector CT imaging of the abdomen and pelvis was performed following the standard protocol without IV contrast. RADIATION DOSE REDUCTION: This exam was performed according to the departmental dose-optimization program which includes automated exposure control, adjustment of the mA and/or kV according to patient size and/or use of iterative reconstruction technique. COMPARISON:  None Available. FINDINGS: Lower chest: No acute pleural or parenchymal lung disease. Hepatobiliary: Unremarkable unenhanced appearance of the liver and gallbladder. Pancreas: Unremarkable unenhanced  appearance. Spleen: Unremarkable unenhanced appearance. Adrenals/Urinary Tract: No urinary tract calculi or obstructive uropathy within either kidney. The adrenals and bladder are unremarkable. Stomach/Bowel: No bowel obstruction or ileus. Moderate retained stool throughout the colon most pronounced in the rectosigmoid colon. No bowel wall thickening or inflammatory change. The appendix is surgically absent. Vascular/Lymphatic: Aortic atherosclerosis. No enlarged abdominal or pelvic lymph nodes. Reproductive: Status post hysterectomy. No adnexal masses. Other: No free fluid or free intraperitoneal gas. No abdominal wall hernia. Musculoskeletal: No acute or destructive bony lesions. Reconstructed images demonstrate no additional findings. IMPRESSION: 1. Significant retained stool throughout the colon consistent  with constipation. 2. No bowel obstruction or ileus. 3.  Aortic Atherosclerosis (ICD10-I70.0). Electronically Signed   By: Randa Ngo M.D.   On: 04/29/2022 22:02    Procedures Fecal disimpaction  Date/Time: 04/29/2022 10:13 PM  Performed by: Lennice Sites, DO Authorized by: Lennice Sites, DO  Consent: Verbal consent obtained. Risks and benefits: risks, benefits and alternatives were discussed Consent given by: patient Patient understanding: patient states understanding of the procedure being performed Patient consent: the patient's understanding of the procedure matches consent given Imaging studies: imaging studies available Required items: required blood products, implants, devices, and special equipment available Time out: Immediately prior to procedure a "time out" was called to verify the correct patient, procedure, equipment, support staff and site/side marked as required.       Medications Ordered in ED Medications - No data to display  ED Course/ Medical Decision Making/ A&P                           Medical Decision Making Amount and/or Complexity of Data  Reviewed Labs: ordered. Radiology: ordered.   Andi Devon Crotwell is here with abdominal pain, constipation.  Normal vitals.  No fever.  Currently on some opioids for collarbone fracture.  History of some chronic constipation as well.  We will check CBC, CMP, lipase, CT scan abdomen pelvis.  Differential diagnosis is fecal impaction versus bowel obstruction.  She denies any pain with urination.  She was able to manually disimpact himself a little bit today.  Per my review and interpretation of labs there is no significant anemia, electrolyte abnormality, kidney injury.  CT scan shows significant retained stool throughout the colon consistent with constipation.  Patient was agreeable to manual disimpaction which I performed and was able to remove some stool ball.  Recommend continued use of MiraLAX at home.  Discharged in good condition.  This chart was dictated using voice recognition software.  Despite best efforts to proofread,  errors can occur which can change the documentation meaning.         Final Clinical Impression(s) / ED Diagnoses Final diagnoses:  Constipation, unspecified constipation type    Rx / DC Orders ED Discharge Orders          Ordered    polyethylene glycol (MIRALAX / GLYCOLAX) 17 g packet  Daily        04/29/22 2214              Lennice Sites, DO 04/29/22 2214

## 2022-04-29 NOTE — ED Notes (Signed)
Patient verbalizes understanding of discharge instructions. Opportunity for questioning and answers were provided. Armband removed by staff, pt discharged from ED. Ambulated out to lobby  

## 2022-04-29 NOTE — ED Triage Notes (Signed)
Patient arrived via POV c/o constipation with impaction. Patient states last BM 04/26/22. Patient states had small, digitally removed stool this morning. Patient states taking 2 ducolase at 1300, and fleets enema at 1700. Patient states 10/19 pain. Patient is AO x 4, VS with elevated BP, normal gait.

## 2022-04-29 NOTE — Discharge Instructions (Addendum)
Mix 2 to 3 packets/capfuls of MiraLAX in 20 to 30 ounces of water.  Repeat daily to desired effect.

## 2022-05-02 ENCOUNTER — Ambulatory Visit: Payer: PRIVATE HEALTH INSURANCE | Admitting: Orthopedic Surgery

## 2022-05-11 ENCOUNTER — Ambulatory Visit (INDEPENDENT_AMBULATORY_CARE_PROVIDER_SITE_OTHER): Payer: PRIVATE HEALTH INSURANCE | Admitting: Orthopedic Surgery

## 2022-05-11 ENCOUNTER — Ambulatory Visit (INDEPENDENT_AMBULATORY_CARE_PROVIDER_SITE_OTHER): Payer: PRIVATE HEALTH INSURANCE

## 2022-05-11 DIAGNOSIS — S42031A Displaced fracture of lateral end of right clavicle, initial encounter for closed fracture: Secondary | ICD-10-CM

## 2022-05-14 ENCOUNTER — Encounter: Payer: Self-pay | Admitting: Orthopedic Surgery

## 2022-05-14 NOTE — Progress Notes (Signed)
Post-Op Visit Note   Patient: Andrea Clarke           Date of Birth: 1945/11/29           MRN: [id number] Visit Date: 05/11/2022 PCP: [name], MD   Assessment & Plan:  Chief Complaint:  Chief Complaint  Patient presents with   Right Shoulder - Follow-up   Visit Diagnoses:  1. Closed displaced fracture of acromial end of right clavicle, initial encounter     Plan: [name] is a 76 year old patient is about 2 weeks out from right distal clavicle fracture.  Patient has been in a sling.  She is seeing some improvements.  Taking Tylenol at sleep to help.  On examination fracture does appear to be moving as a unit.  I think she is okay to be out of the sling for about 4 hours a day but do not do any lifting.  3-week return with repeat radiographs at that time where hopefully we will see a little bit more callus formation.  She is taking vitamin D.  Follow-Up Instructions: No follow-ups on file.   Orders:  Orders Placed This Encounter  Procedures   XR Clavicle Right   No orders of the defined types were placed in this encounter.   Imaging: No results found.  PMFS History: Patient Active Problem List   Diagnosis Date Noted   Port-A-Cath in place 08/31/2018   Malignant neoplasm of upper-inner quadrant of left breast in female, estrogen receptor negative ([place]) 08/28/2018   Hypertension    Hyperlipidemia    Glaucoma    GERD (gastroesophageal reflux disease)    HYPERLIPIDEMIA 06/11/2007   GLAUCOMA NOS 06/11/2007   HTN (hypertension) 06/11/2007   ALLERGIC RHINITIS 06/11/2007   GERD 06/11/2007   HERPES ZOSTER, UNCOMPLICATED 43/15/4008   Past Medical History:  Diagnosis Date   Atrial tachycardia ([place])    Breast cancer ([place])    left breast cancer 2019   Dysrhythmia    WCT 07/2017 Holter monitor, started on flecainide (EP Dr. Allegra Lai)   GERD (gastroesophageal reflux disease)    Glaucoma    Hyperlipidemia    Hypertension    Personal history of  chemotherapy    Personal history of radiation therapy    finished 02/11/19   PONV (postoperative nausea and vomiting)    did well with lumpectomy and port placement    Family History  Problem Relation Age of Onset   Hypertension Mother    Atrial fibrillation Mother    Heart disease Maternal Grandmother    Hypertension Maternal Grandmother    Rheum arthritis Maternal Grandmother    Diabetes Maternal Grandfather    Heart disease Paternal Grandfather    Breast cancer Neg Hx     Past Surgical History:  Procedure Laterality Date   ABDOMINAL HYSTERECTOMY     ANKLE SURGERY     APPENDECTOMY     BREAST BIOPSY     Left   BREAST LUMPECTOMY Left 12/05/2018   BREAST LUMPECTOMY Left 2019   BREAST LUMPECTOMY WITH RADIOACTIVE SEED AND SENTINEL LYMPH NODE BIOPSY N/A 12/05/2018   Procedure: LEFT BREAST LUMPECTOMY WITH RADIOACTIVE SEED AND LEFT AXILLARY SENTINEL LYMPH NODE BIOPSY AND BLUE DYE INJECTION;  Surgeon: Rolm Bookbinder, MD;  Location: [facility];  Service: General;  Laterality: N/A;   CATARACT EXTRACTION W/ INTRAOCULAR LENS IMPLANT Bilateral    PORT-A-CATH REMOVAL N/A 08/27/2019   Procedure: MINOR REMOVAL PORT-A-CATH;  Surgeon: Rolm Bookbinder, MD;  Location: [facility];  Service: General;  Laterality: N/A;   PORTACATH PLACEMENT Right 09/03/2018   Procedure: INSERTION PORT-A-CATH WITH [place];  Surgeon: Rolm Bookbinder, MD;  Location: [facility];  Service: General;  Laterality: Right;   ROTATOR CUFF REPAIR Right    TONSILLECTOMY     Social History   Occupational History   Not on file  Tobacco Use   Smoking status: Former    Types: Cigarettes   Smokeless tobacco: Never  Vaping Use   Vaping Use: Never used  Substance and Sexual Activity   Alcohol use: Yes    Comment: social   Drug use: No   Sexual activity: Not on file

## 2022-06-01 ENCOUNTER — Ambulatory Visit (INDEPENDENT_AMBULATORY_CARE_PROVIDER_SITE_OTHER): Payer: PRIVATE HEALTH INSURANCE

## 2022-06-01 ENCOUNTER — Ambulatory Visit (INDEPENDENT_AMBULATORY_CARE_PROVIDER_SITE_OTHER): Payer: PRIVATE HEALTH INSURANCE | Admitting: Surgical

## 2022-06-01 DIAGNOSIS — S42031A Displaced fracture of lateral end of right clavicle, initial encounter for closed fracture: Secondary | ICD-10-CM

## 2022-06-05 ENCOUNTER — Encounter: Payer: Self-pay | Admitting: Orthopedic Surgery

## 2022-06-05 NOTE — Progress Notes (Signed)
Post-fracture visit Note   Patient: Andrea Clarke           Date of Birth: 19-Jan-1946           MRN: [id number] Visit Date: 06/01/2022 PCP: [name], MD   Assessment & Plan:  Chief Complaint:  Chief Complaint  Patient presents with   Other    F/u clavicle fx   Visit Diagnoses:  1. Closed displaced fracture of acromial end of right clavicle, initial encounter     Plan: Patient is a 76 year old female who returns for reevaluation of right clavicle fracture.  Date of injury 04/18/2022.  States that her arm feels a lot better overall.  She is in the sling.  Pain is significantly improved and she does not have to take any medications for pain.  She is able to sleep well at night without much difficulty.  On exam, she has no significantly increased pain with passive shoulder range of motion.  She has mild tenderness over the fracture site.  No pain with crossarm adduction.  Excellent rotator cuff strength of supra, infra, subscap.  Axillary nerve intact with deltoid firing.  Palpable radial pulse of the injured extremity.  Radiographs of the right clavicle taken today demonstrate continued fracture line but with some increased fracture consolidation since previous radiographs.  Plan is to discontinue sling and okay for shoulder range of motion but no heavy lifting with the arm and no overhead lifting especially.  Follow-up for clinical recheck with Dr. Marlou Sa in 3 weeks with new radiographs to check for increased callus formation.  She is taking vitamin D daily.  Follow-Up Instructions: No follow-ups on file.   Orders:  Orders Placed This Encounter  Procedures   XR Clavicle Right   No orders of the defined types were placed in this encounter.   Imaging: No results found.  PMFS History: Patient Active Problem List   Diagnosis Date Noted   Port-A-Cath in place 08/31/2018   Malignant neoplasm of upper-inner quadrant of left breast in female, estrogen receptor negative  ([place]) 08/28/2018   Hypertension    Hyperlipidemia    Glaucoma    GERD (gastroesophageal reflux disease)    HYPERLIPIDEMIA 06/11/2007   GLAUCOMA NOS 06/11/2007   HTN (hypertension) 06/11/2007   ALLERGIC RHINITIS 06/11/2007   GERD 06/11/2007   HERPES ZOSTER, UNCOMPLICATED 02/40/9735   Past Medical History:  Diagnosis Date   Atrial tachycardia ([place])    Breast cancer ([place])    left breast cancer 2019   Dysrhythmia    WCT 07/2017 Holter monitor, started on flecainide (EP Dr. Allegra Lai)   GERD (gastroesophageal reflux disease)    Glaucoma    Hyperlipidemia    Hypertension    Personal history of chemotherapy    Personal history of radiation therapy    finished 02/11/19   PONV (postoperative nausea and vomiting)    did well with lumpectomy and port placement    Family History  Problem Relation Age of Onset   Hypertension Mother    Atrial fibrillation Mother    Heart disease Maternal Grandmother    Hypertension Maternal Grandmother    Rheum arthritis Maternal Grandmother    Diabetes Maternal Grandfather    Heart disease Paternal Grandfather    Breast cancer Neg Hx     Past Surgical History:  Procedure Laterality Date   ABDOMINAL HYSTERECTOMY     ANKLE SURGERY     APPENDECTOMY     BREAST BIOPSY     Left  BREAST LUMPECTOMY Left 12/05/2018   BREAST LUMPECTOMY Left 2019   BREAST LUMPECTOMY WITH RADIOACTIVE SEED AND SENTINEL LYMPH NODE BIOPSY N/A 12/05/2018   Procedure: LEFT BREAST LUMPECTOMY WITH RADIOACTIVE SEED AND LEFT AXILLARY SENTINEL LYMPH NODE BIOPSY AND BLUE DYE INJECTION;  Surgeon: Rolm Bookbinder, MD;  Location: [facility];  Service: General;  Laterality: N/A;   CATARACT EXTRACTION W/ INTRAOCULAR LENS IMPLANT Bilateral    PORT-A-CATH REMOVAL N/A 08/27/2019   Procedure: MINOR REMOVAL PORT-A-CATH;  Surgeon: Rolm Bookbinder, MD;  Location: [facility];  Service: General;  Laterality: N/A;   PORTACATH PLACEMENT Right 09/03/2018   Procedure: INSERTION  PORT-A-CATH WITH [place];  Surgeon: Rolm Bookbinder, MD;  Location: [facility];  Service: General;  Laterality: Right;   ROTATOR CUFF REPAIR Right    TONSILLECTOMY     Social History   Occupational History   Not on file  Tobacco Use   Smoking status: Former    Types: Cigarettes   Smokeless tobacco: Never  Vaping Use   Vaping Use: Never used  Substance and Sexual Activity   Alcohol use: Yes    Comment: social   Drug use: No   Sexual activity: Not on file

## 2022-06-27 ENCOUNTER — Encounter: Payer: Self-pay | Admitting: Orthopedic Surgery

## 2022-06-27 ENCOUNTER — Ambulatory Visit (INDEPENDENT_AMBULATORY_CARE_PROVIDER_SITE_OTHER): Payer: PRIVATE HEALTH INSURANCE

## 2022-06-27 ENCOUNTER — Ambulatory Visit (INDEPENDENT_AMBULATORY_CARE_PROVIDER_SITE_OTHER): Payer: PRIVATE HEALTH INSURANCE | Admitting: Orthopedic Surgery

## 2022-06-27 DIAGNOSIS — S42031A Displaced fracture of lateral end of right clavicle, initial encounter for closed fracture: Secondary | ICD-10-CM

## 2022-06-27 NOTE — Progress Notes (Signed)
Post-Fracture Visit Note   Patient: Andrea Clarke           Date of Birth: 11-30-1945           MRN: [id number] Visit Date: 06/27/2022 PCP: [name], MD   Assessment & Plan:  Chief Complaint:  Chief Complaint  Patient presents with   Right Shoulder - Fracture   Visit Diagnoses:  1. Closed displaced fracture of acromial end of right clavicle, initial encounter     Plan: Patient is a 76 year old female who presents s/p right clavicle fracture.  She returns for reevaluation.  Date of injury 04/18/2022.  Still notes a little bit of weakness and pain but overall feels like she is progressing well.  Today is the first day that she has completely discontinued her sling pain even outside of her house.  She is working.  She is able to lift her right arm overhead without significant difficulty though she does have a little bit of stiffness.  She has not really tried laying on her right side yet at night but she is sleeping fairly well.  No mechanical symptoms in the shoulder.  On exam, patient has 40 degrees X rotation, 110 degrees abduction, 160 degrees forward flexion of the right shoulder compared with 60 degrees X rotation, 125 degrees abduction, 180 degrees forward flexion of the left shoulder.  She has mild tenderness over the fracture site with subjective sensation of 3/10 pain.  There is no instability noted of the [facility] joint.  No tenderness over the bicipital groove.  She has excellent rotator cuff strength of supra, infra, subscap of the right shoulder.  Axillary nerve is intact with deltoid firing.  Radiographs of the right clavicle taken today demonstrate increased fracture consolidation and callus formation compared with prior radiographs.  No displacement of the fracture site.  Plan is to completely discontinue sling.  No overhead lifting.  She is okay for full active and passive range of motion of the right shoulder.  Start right shoulder range of motion exercises at home.   Follow-up in 6 weeks for clinical recheck.  Encouraged her to reach out sooner if she has any increased discomfort.  Follow-Up Instructions: No follow-ups on file.   Orders:  Orders Placed This Encounter  Procedures   XR Clavicle Right   No orders of the defined types were placed in this encounter.   Imaging: No results found.  PMFS History: Patient Active Problem List   Diagnosis Date Noted   Port-A-Cath in place 08/31/2018   Malignant neoplasm of upper-inner quadrant of left breast in female, estrogen receptor negative ([place]) 08/28/2018   Hypertension    Hyperlipidemia    Glaucoma    GERD (gastroesophageal reflux disease)    HYPERLIPIDEMIA 06/11/2007   GLAUCOMA NOS 06/11/2007   HTN (hypertension) 06/11/2007   ALLERGIC RHINITIS 06/11/2007   GERD 06/11/2007   HERPES ZOSTER, UNCOMPLICATED 05/30/4817   Past Medical History:  Diagnosis Date   Atrial tachycardia ([place])    Breast cancer ([place])    left breast cancer 2019   Dysrhythmia    WCT 07/2017 Holter monitor, started on flecainide (EP Dr. Allegra Lai)   GERD (gastroesophageal reflux disease)    Glaucoma    Hyperlipidemia    Hypertension    Personal history of chemotherapy    Personal history of radiation therapy    finished 02/11/19   PONV (postoperative nausea and vomiting)    did well with lumpectomy and port placement  Family History  Problem Relation Age of Onset   Hypertension Mother    Atrial fibrillation Mother    Heart disease Maternal Grandmother    Hypertension Maternal Grandmother    Rheum arthritis Maternal Grandmother    Diabetes Maternal Grandfather    Heart disease Paternal Grandfather    Breast cancer Neg Hx     Past Surgical History:  Procedure Laterality Date   ABDOMINAL HYSTERECTOMY     ANKLE SURGERY     APPENDECTOMY     BREAST BIOPSY     Left   BREAST LUMPECTOMY Left 12/05/2018   BREAST LUMPECTOMY Left 2019   BREAST LUMPECTOMY WITH RADIOACTIVE SEED AND SENTINEL LYMPH NODE BIOPSY  N/A 12/05/2018   Procedure: LEFT BREAST LUMPECTOMY WITH RADIOACTIVE SEED AND LEFT AXILLARY SENTINEL LYMPH NODE BIOPSY AND BLUE DYE INJECTION;  Surgeon: Rolm Bookbinder, MD;  Location: [facility];  Service: General;  Laterality: N/A;   CATARACT EXTRACTION W/ INTRAOCULAR LENS IMPLANT Bilateral    PORT-A-CATH REMOVAL N/A 08/27/2019   Procedure: MINOR REMOVAL PORT-A-CATH;  Surgeon: Rolm Bookbinder, MD;  Location: [facility];  Service: General;  Laterality: N/A;   PORTACATH PLACEMENT Right 09/03/2018   Procedure: [place] WITH [place];  Surgeon: Rolm Bookbinder, MD;  Location: [facility];  Service: General;  Laterality: Right;   ROTATOR CUFF REPAIR Right    TONSILLECTOMY     Social History   Occupational History   Not on file  Tobacco Use   Smoking status: Former    Types: Cigarettes   Smokeless tobacco: Never  Vaping Use   Vaping Use: Never used  Substance and Sexual Activity   Alcohol use: Yes    Comment: social   Drug use: No   Sexual activity: Not on file

## 2022-07-08 ENCOUNTER — Other Ambulatory Visit: Payer: Self-pay | Admitting: Hematology and Oncology

## 2022-07-08 DIAGNOSIS — Z853 Personal history of malignant neoplasm of breast: Secondary | ICD-10-CM

## 2022-07-08 DIAGNOSIS — Z09 Encounter for follow-up examination after completed treatment for conditions other than malignant neoplasm: Secondary | ICD-10-CM

## 2022-08-01 NOTE — Progress Notes (Signed)
Patient Care Team: [name], MD as PCP - General (Internal Medicine) Constance Haw, MD as PCP - Electrophysiology (Cardiology) Rolm Bookbinder, MD as Consulting Physician (General Surgery) Nicholas Lose, MD as Consulting Physician (Hematology and Oncology) Kyung Rudd, MD as Consulting Physician (Radiation Oncology)  DIAGNOSIS: No diagnosis found.  SUMMARY OF ONCOLOGIC HISTORY: Oncology History  Malignant neoplasm of upper-inner quadrant of left breast in female, estrogen receptor negative ([place])  08/17/2018 Initial Diagnosis   Screening mammogram detected abnormality in the left breast by ultrasound irregular mass 10:00 position 1.3 cm, 10 o'clock position no enlarged lymph nodes, Biopsy ([id number]) revealed: IMC, grade 3; ER 0%, PR 0%, Ki-67 80%, HER-2 positive 3+ by [facility]   08/28/2018 Cancer Staging   Staging form: Breast, AJCC 8th Edition - Clinical stage from 08/28/2018: Stage IA (cT1c, cN0, cM0, G3, ER-, PR-, HER2+) - Signed by Nicholas Lose, MD on 08/28/2018   08/28/2018 Breast MRI   malignancy within the UPPER INNER LEFT breast with dominant mass measuring 2.1 cm. With small adjacent satellite nodules, the entire area 1.5 x 3 x 2.2 cm.No evidence of abnormal lymph nodes or RIGHT breast malignancy.   09/03/2018 -  Chemotherapy   Weekly Taxol and Herceptin followed by maintenance Herceptin for one year.  EF: 60-65% on 06/21/2019   12/05/2018 Surgery   Left lumpectomy Donne Hazel) [phone]): No residual invasive carcinoma, margins negative, 0/2 lymph nodes negative, complete pathologic response [id number]   01/15/2019 - 02/11/2019 Radiation Therapy   The patient initially received a dose of 42.56 Gy in 16 fractions to the left breast using whole-breast tangent fields. This was delivered using a 3-D conformal technique. The patient then received a boost to the seroma. This delivered an additional 8 Gy in 4 fractions using a 3 field photon technique due to the depth of  the seroma. The total dose was 50.56 Gy.     CHIEF COMPLIANT: Follow-up of left breast cancer     INTERVAL HISTORY: Andrea Clarke is a 76 y.o. with above-mentioned history of left breast cancer who underwent neoadjuvant chemotherapy, lumpectomy, radiation, maintenance Herceptin, and is currently on surveillance. She presents to the clinic today for follow-up.   ALLERGIES:  is allergic to adhesive [tape], oxycodone hcl, augmentin [amoxicillin-pot clavulanate], and xylocaine [lidocaine hcl].  MEDICATIONS:  Current Outpatient Medications  Medication Sig Dispense Refill   acetaminophen (TYLENOL) 500 MG tablet Take 1,000 mg by mouth daily as needed for moderate pain or headache.      atorvastatin (LIPITOR) 20 MG tablet Take 20 mg by mouth daily.     cetirizine (ZYRTEC) 10 MG tablet Take 10 mg by mouth daily.     Cholecalciferol (VITAMIN D3) 125 MCG (5000 UT) CAPS Take 5,000 Units by mouth at bedtime.      Coenzyme Q10 10 MG capsule Take 10 mg by mouth at bedtime.     dexlansoprazole (DEXILANT) 60 MG capsule Take 60 mg by mouth daily.     diltiazem (CARDIZEM CD) 120 MG 24 hr capsule Take 1 capsule (120 mg total) by mouth daily. 90 capsule 3   flecainide (TAMBOCOR) 100 MG tablet TAKE 1 TABLET BY MOUTH TWICE  DAILY 180 tablet 3   fluticasone (FLONASE) 50 MCG/ACT nasal spray Place 1 spray into both nostrils daily as needed for allergies.      furosemide (LASIX) 20 MG tablet TAKE 1 TABLET(20 MG) BY MOUTH DAILY 30 tablet 0   Krill Oil 500 MG CAPS Take 500 mg by mouth at bedtime.  lisinopril (ZESTRIL) 20 MG tablet Take 1 tablet (20 mg total) by mouth daily. 30 tablet 6   montelukast (SINGULAIR) 10 MG tablet Take 10 mg by mouth daily.     Multiple Vitamin (MULTIVITAMIN) tablet Take 1 tablet by mouth at bedtime.      Polyethyl Glycol-Propyl Glycol (SYSTANE OP) Place 1 drop into both eyes 2 (two) times daily as needed (dry eyes).     polyethylene glycol (MIRALAX / GLYCOLAX) 17 g packet Take  17 g by mouth daily. 14 each 0   No current facility-administered medications for this visit.    PHYSICAL EXAMINATION: ECOG PERFORMANCE STATUS: {CHL ONC ECOG PS:[id number]}  There were no vitals filed for this visit. There were no vitals filed for this visit.  BREAST:*** No palpable masses or nodules in either right or left breasts. No palpable axillary supraclavicular or infraclavicular adenopathy no breast tenderness or nipple discharge. (exam performed in the presence of a chaperone)  LABORATORY DATA:  I have reviewed the data as listed    Latest Ref Rng & Units 04/29/2022    8:30 PM 09/23/2019    1:33 PM 07/30/2019    9:15 AM  CMP  Glucose 70 - 99 mg/dL 196  136  137   BUN 8 - 23 mg/dL '17  17  16   ' Creatinine 0.44 - 1.00 mg/dL 0.93  0.83  0.78   Sodium 135 - 145 mmol/L 135  141  139   Potassium 3.5 - 5.1 mmol/L 4.5  4.2  4.2   Chloride 98 - 111 mmol/L 100  101  103   CO2 22 - 32 mmol/L '25  26  25   ' Calcium 8.9 - 10.3 mg/dL 9.5  9.3  9.1   Total Protein 6.5 - 8.1 g/dL 7.4  6.3  6.9   Total Bilirubin 0.3 - 1.2 mg/dL 0.9  0.5  0.7   Alkaline Phos 38 - 126 U/L 98  116  105   AST 15 - 41 U/L 33  74  81   ALT 0 - 44 U/L 38  109  112     Lab Results  Component Value Date   WBC 10.7 (H) 04/29/2022   HGB 15.1 (H) 04/29/2022   HCT 41.9 04/29/2022   MCV 98.1 04/29/2022   PLT 192 04/29/2022   NEUTROABS 9.1 (H) 04/29/2022    ASSESSMENT & PLAN:  No problem-specific Assessment & Plan notes found for this encounter.    No orders of the defined types were placed in this encounter.  The patient has a good understanding of the overall plan. she agrees with it. she will call with any problems that may develop before the next visit here. Total time spent: 30 mins including face to face time and time spent for planning, charting and co-ordination of care   Suzzette Righter, [place] 08/01/22    I Gardiner Coins am scribing for Dr. Lindi Adie  ***

## 2022-08-03 ENCOUNTER — Inpatient Hospital Stay: Payer: PRIVATE HEALTH INSURANCE | Attending: Hematology and Oncology | Admitting: Hematology and Oncology

## 2022-08-03 DIAGNOSIS — Z9221 Personal history of antineoplastic chemotherapy: Secondary | ICD-10-CM | POA: Insufficient documentation

## 2022-08-03 DIAGNOSIS — Z853 Personal history of malignant neoplasm of breast: Secondary | ICD-10-CM | POA: Diagnosis present

## 2022-08-03 DIAGNOSIS — Z171 Estrogen receptor negative status [ER-]: Secondary | ICD-10-CM

## 2022-08-03 DIAGNOSIS — Z923 Personal history of irradiation: Secondary | ICD-10-CM | POA: Diagnosis not present

## 2022-08-03 DIAGNOSIS — C50212 Malignant neoplasm of upper-inner quadrant of left female breast: Secondary | ICD-10-CM

## 2022-08-03 NOTE — Assessment & Plan Note (Addendum)
08/17/2018:Screening mammogram detected abnormality in the left breast by ultrasound irregular mass 10:00 position 1.3 cm, 10 o'clock position no enlarged lymph nodes, ER 0%, PR 0%, Ki-67 80%, HER-2 +3+ by IHC, grade 3, lymphovascular invasion present, T1c N0 stage Ia clinical stage  12/05/2018:Left lumpectomy: No residual invasive carcinoma, margins negative, 0/2 lymph nodes negative, complete pathologic response [id number] Adjuvant radiation therapy: 01/16/2019-02/11/2019 Herceptin maintenance completed October 2020  Treatment plan:Surveillance  Breast cancer surveillance: 1.breast exam: 08/03/2022: Benign 2.mammogram: Scheduled for 08/15/2022  She would like to participate in [facility] minimal residual disease testing.   She participates in fundraising for [email or web address] Return to clinic in 1 year for follow-up

## 2022-08-08 ENCOUNTER — Ambulatory Visit (INDEPENDENT_AMBULATORY_CARE_PROVIDER_SITE_OTHER): Payer: PRIVATE HEALTH INSURANCE | Admitting: Orthopedic Surgery

## 2022-08-08 ENCOUNTER — Encounter: Payer: Self-pay | Admitting: Orthopedic Surgery

## 2022-08-08 ENCOUNTER — Ambulatory Visit (INDEPENDENT_AMBULATORY_CARE_PROVIDER_SITE_OTHER): Payer: PRIVATE HEALTH INSURANCE

## 2022-08-08 VITALS — Ht 62.5 in | Wt 140.0 lb

## 2022-08-08 DIAGNOSIS — S42031A Displaced fracture of lateral end of right clavicle, initial encounter for closed fracture: Secondary | ICD-10-CM

## 2022-08-09 ENCOUNTER — Other Ambulatory Visit: Payer: Self-pay

## 2022-08-09 DIAGNOSIS — C50212 Malignant neoplasm of upper-inner quadrant of left female breast: Secondary | ICD-10-CM

## 2022-08-09 NOTE — Progress Notes (Signed)
[employer] order and requisition faxed per MD. Fax confirmation received.

## 2022-08-11 ENCOUNTER — Encounter: Payer: Self-pay | Admitting: Orthopedic Surgery

## 2022-08-11 NOTE — Progress Notes (Signed)
Post-Op Visit Note   Patient: Andrea Clarke           Date of Birth: 02-25-1946           MRN: [id number] Visit Date: 08/08/2022 PCP: [name], MD   Assessment & Plan:  Chief Complaint:  Chief Complaint  Patient presents with   Right Shoulder - Follow-up, Fracture    DOI 04/18/2022   Visit Diagnoses:  1. Closed displaced fracture of acromial end of right clavicle, initial encounter     Plan: Andrea Clarke is a 76 year old patient with right clavicle fracture sustained 04/18/2022.  Overall she is doing well.  She has full forward flexion and minimal tenderness at the fracture site.  No medications for pain.  Radiographs show good alignment of the fracture with callus formation.  Plan at this time is activity as tolerated.  She will follow-up with [place] as needed.  Follow-Up Instructions: No follow-ups on file.   Orders:  Orders Placed This Encounter  Procedures   XR Clavicle Right   No orders of the defined types were placed in this encounter.   Imaging: No results found.  PMFS History: Patient Active Problem List   Diagnosis Date Noted   Port-A-Cath in place 08/31/2018   Malignant neoplasm of upper-inner quadrant of left breast in female, estrogen receptor negative ([place]) 08/28/2018   Hypertension    Hyperlipidemia    Glaucoma    GERD (gastroesophageal reflux disease)    HYPERLIPIDEMIA 06/11/2007   GLAUCOMA NOS 06/11/2007   HTN (hypertension) 06/11/2007   ALLERGIC RHINITIS 06/11/2007   GERD 06/11/2007   HERPES ZOSTER, UNCOMPLICATED 29/93/7169   Past Medical History:  Diagnosis Date   Atrial tachycardia    Breast cancer ([place])    left breast cancer 2019   Dysrhythmia    WCT 07/2017 Holter monitor, started on flecainide (EP Dr. Allegra Lai)   GERD (gastroesophageal reflux disease)    Glaucoma    Hyperlipidemia    Hypertension    Personal history of chemotherapy    Personal history of radiation therapy    finished 02/11/19   PONV (postoperative nausea  and vomiting)    did well with lumpectomy and port placement    Family History  Problem Relation Age of Onset   Hypertension Mother    Atrial fibrillation Mother    Heart disease Maternal Grandmother    Hypertension Maternal Grandmother    Rheum arthritis Maternal Grandmother    Diabetes Maternal Grandfather    Heart disease Paternal Grandfather    Breast cancer Neg Hx     Past Surgical History:  Procedure Laterality Date   ABDOMINAL HYSTERECTOMY     ANKLE SURGERY     APPENDECTOMY     BREAST BIOPSY     Left   BREAST LUMPECTOMY Left 12/05/2018   BREAST LUMPECTOMY Left 2019   BREAST LUMPECTOMY WITH RADIOACTIVE SEED AND SENTINEL LYMPH NODE BIOPSY N/A 12/05/2018   Procedure: LEFT BREAST LUMPECTOMY WITH RADIOACTIVE SEED AND LEFT AXILLARY SENTINEL LYMPH NODE BIOPSY AND BLUE DYE INJECTION;  Surgeon: Rolm Bookbinder, MD;  Location: [place];  Service: General;  Laterality: N/A;   CATARACT EXTRACTION W/ INTRAOCULAR LENS IMPLANT Bilateral    PORT-A-CATH REMOVAL N/A 08/27/2019   Procedure: MINOR REMOVAL PORT-A-CATH;  Surgeon: Rolm Bookbinder, MD;  Location: [place];  Service: General;  Laterality: N/A;   PORTACATH PLACEMENT Right 09/03/2018   Procedure: INSERTION PORT-A-CATH WITH [place];  Surgeon: Rolm Bookbinder, MD;  Location: [place];  Service:  General;  Laterality: Right;   ROTATOR CUFF REPAIR Right    TONSILLECTOMY     Social History   Occupational History   Not on file  Tobacco Use   Smoking status: Former    Types: Cigarettes   Smokeless tobacco: Never  Vaping Use   Vaping Use: Never used  Substance and Sexual Activity   Alcohol use: Yes    Comment: social   Drug use: No   Sexual activity: Not on file

## 2022-08-16 ENCOUNTER — Ambulatory Visit
Admission: RE | Admit: 2022-08-16 | Discharge: 2022-08-16 | Disposition: A | Discharge status: Home / Self Care | Payer: PRIVATE HEALTH INSURANCE | Source: Ambulatory Visit | Attending: Hematology and Oncology | Admitting: Hematology and Oncology

## 2022-08-16 DIAGNOSIS — Z853 Personal history of malignant neoplasm of breast: Secondary | ICD-10-CM

## 2022-08-16 DIAGNOSIS — Z09 Encounter for follow-up examination after completed treatment for conditions other than malignant neoplasm: Secondary | ICD-10-CM

## 2022-08-24 ENCOUNTER — Other Ambulatory Visit: Payer: Self-pay | Admitting: Cardiology

## 2022-09-05 LAB — SIGNATERA ONLY (NATERA MANAGED)
SIGNATERA MTM READOUT: 0 MTM/ml
SIGNATERA TEST RESULT: NEGATIVE

## 2022-09-14 ENCOUNTER — Telehealth: Payer: Self-pay

## 2022-09-14 NOTE — Telephone Encounter (Signed)
Called pt per MD to advise [employer] testing was negative/not detected. Pt verbalized understanding of results and knows [employer] will be in touch to schedule 3 mo repeat lab.   

## 2022-10-05 NOTE — Progress Notes (Signed)
PCP:  [name], MD Primary Cardiologist: None Electrophysiologist: Will Meredith Leeds, MD   Andrea Clarke is a 76 y.o. female seen today for Will Meredith Leeds, MD for routine electrophysiology followup. Since last being seen in our clinic the patient reports doing very well. She has rare palpitations. Had very brief (seconds) episode last week, but prior to that can't recall the last time.  she denies chest pain, dyspnea, PND, orthopnea, nausea, vomiting, dizziness, syncope, edema, weight gain, or early satiety.   Past Medical History:  Diagnosis Date   Atrial tachycardia    Breast cancer ([place])    left breast cancer 2019   Dysrhythmia    WCT 07/2017 Holter monitor, started on flecainide (EP Dr. Allegra Lai)   GERD (gastroesophageal reflux disease)    Glaucoma    Hyperlipidemia    Hypertension    Personal history of chemotherapy    Personal history of radiation therapy    finished 02/11/19   PONV (postoperative nausea and vomiting)    did well with lumpectomy and port placement   Past Surgical History:  Procedure Laterality Date   ABDOMINAL HYSTERECTOMY     ANKLE SURGERY     APPENDECTOMY     BREAST BIOPSY     Left   BREAST LUMPECTOMY Left 12/05/2018   BREAST LUMPECTOMY Left 2019   BREAST LUMPECTOMY WITH RADIOACTIVE SEED AND SENTINEL LYMPH NODE BIOPSY N/A 12/05/2018   Procedure: LEFT BREAST LUMPECTOMY WITH RADIOACTIVE SEED AND LEFT AXILLARY SENTINEL LYMPH NODE BIOPSY AND BLUE DYE INJECTION;  Surgeon: Rolm Bookbinder, MD;  Location: [place];  Service: General;  Laterality: N/A;   CATARACT EXTRACTION W/ INTRAOCULAR LENS IMPLANT Bilateral    PORT-A-CATH REMOVAL N/A 08/27/2019   Procedure: MINOR REMOVAL PORT-A-CATH;  Surgeon: Rolm Bookbinder, MD;  Location: [place];  Service: General;  Laterality: N/A;   PORTACATH PLACEMENT Right 09/03/2018   Procedure: INSERTION PORT-A-CATH WITH [place];  Surgeon: Rolm Bookbinder, MD;  Location: [place];   Service: General;  Laterality: Right;   ROTATOR CUFF REPAIR Right    TONSILLECTOMY      Current Outpatient Medications  Medication Sig Dispense Refill   acetaminophen (TYLENOL) 500 MG tablet Take 1,000 mg by mouth daily as needed for moderate pain or headache.      atorvastatin (LIPITOR) 20 MG tablet Take 20 mg by mouth daily.     cetirizine (ZYRTEC) 10 MG tablet Take 10 mg by mouth daily.     Cholecalciferol (VITAMIN D3) 125 MCG (5000 UT) CAPS Take 5,000 Units by mouth at bedtime.      Coenzyme Q10 10 MG capsule Take 10 mg by mouth at bedtime.     dexlansoprazole (DEXILANT) 60 MG capsule Take 60 mg by mouth daily.     diltiazem (CARDIZEM CD) 120 MG 24 hr capsule Take 1 capsule (120 mg total) by mouth daily. 90 capsule 3   flecainide (TAMBOCOR) 100 MG tablet TAKE 1 TABLET BY MOUTH TWICE  DAILY 180 tablet 3   fluticasone (FLONASE) 50 MCG/ACT nasal spray Place 1 spray into both nostrils daily as needed for allergies.      furosemide (LASIX) 20 MG tablet TAKE 1 TABLET(20 MG) BY MOUTH DAILY 30 tablet 0   Krill Oil 500 MG CAPS Take 500 mg by mouth at bedtime.     lisinopril (ZESTRIL) 20 MG tablet TAKE 1 TABLET(20 MG) BY MOUTH DAILY 30 tablet 6   montelukast (SINGULAIR) 10 MG tablet Take 10 mg by mouth daily.  Multiple Vitamin (MULTIVITAMIN) tablet Take 1 tablet by mouth at bedtime.      Polyethyl Glycol-Propyl Glycol (SYSTANE OP) Place 1 drop into both eyes 2 (two) times daily as needed (dry eyes).     polyethylene glycol (MIRALAX / GLYCOLAX) 17 g packet Take 17 g by mouth daily. 14 each 0   RYBELSUS 7 MG TABS Take 1 tablet by mouth daily.     No current facility-administered medications for this visit.    Allergies  Allergen Reactions   Adhesive [Tape] Other (See Comments)    Pulls skin off   Oxycodone Hcl Hives   Augmentin [Amoxicillin-Pot Clavulanate] Diarrhea    Has patient had a PCN reaction causing immediate rash, facial/tongue/throat swelling, SOB or lightheadedness with  hypotension: No Has patient had a PCN reaction causing severe rash involving mucus membranes or skin necrosis: No Has patient had a PCN reaction that required hospitalization: No Has patient had a PCN reaction occurring within the last 10 years: Yes If all of the above answers are "NO", then may proceed with Cephalosporin use.    Xylocaine [Lidocaine Hcl] Palpitations    For Dental Procedures     Social History   Socioeconomic History   Marital status: Married    Spouse name: Not on file   Number of children: Not on file   Years of education: Not on file   Highest education level: Not on file  Occupational History   Not on file  Tobacco Use   Smoking status: Former    Types: Cigarettes   Smokeless tobacco: Never  Vaping Use   Vaping Use: Never used  Substance and Sexual Activity   Alcohol use: Yes    Comment: social   Drug use: No   Sexual activity: Not on file  Other Topics Concern   Not on file  Social History Narrative   Not on file   Social Determinants of Health   Financial Resource Strain: Not on file  Food Insecurity: Not on file  Transportation Needs: No Transportation Needs (12/20/2018)   PRAPARE - Hydrologist (Medical): No    Lack of Transportation (Non-Medical): No  Physical Activity: Not on file  Stress: Not on file  Social Connections: Not on file  Intimate Partner Violence: Not on file     Review of Systems: All other systems reviewed and are otherwise negative except as noted above.  Physical Exam: Vitals:   10/07/22 0954  BP: 118/72  Pulse: 73  SpO2: 98%  Weight: 143 lb (64.9 kg)  Height: 5' 2.5" (1.588 m)    GEN- The patient is well appearing, alert and oriented x 3 today.   HEENT: normocephalic, atraumatic; sclera clear, conjunctiva pink; hearing intact; oropharynx clear; neck supple, no JVP Lymph- no cervical lymphadenopathy Lungs- Clear to ausculation bilaterally, normal work of breathing.  No wheezes,  rales, rhonchi Heart- Regular rate and rhythm, no murmurs, rubs or gallops, PMI not laterally displaced GI- soft, non-tender, non-distended, bowel sounds present, no hepatosplenomegaly Extremities- No peripheral edema. no clubbing or cyanosis; DP/PT/radial pulses 2+ bilaterally MS- no significant deformity or atrophy Skin- warm and dry, no rash or lesion Psych- euthymic mood, full affect Neuro- strength and sensation are intact  EKG is ordered. Personal review of EKG from today shows NSR at 62 bpm with stable intervals  Additional studies reviewed include: Previous EP notes.   ECHO COMPLETE WO IMAGING ENHANCING AGENT 06/21/2019 1. The left ventricle has normal systolic function with an  ejection fraction of 60-65%. The cavity size was normal. Left ventricular diastolic Doppler parameters are consistent with impaired relaxation. 2. The right ventricle has normal systolic function. The cavity was normal. There is no increase in right ventricular wall thickness. 3. The aortic valve is tricuspid. Mild thickening of the aortic valve. Mild calcification of the aortic valve. Aortic valve regurgitation is trivial by color flow Doppler. 4. The aorta is normal unless otherwise noted. 5. The average left ventricular global longitudinal strain is -24.1 %.    Assessment and Plan:  1. H/o VT Continue flecainide 100 mg BID  2. HTN Stable on current regimen   3. HLD Continue statin   Follow up with Dr. Curt Bears in 6 months  Shirley Friar, PA-C  10/07/22 10:01 AM

## 2022-10-07 ENCOUNTER — Ambulatory Visit: Payer: PRIVATE HEALTH INSURANCE | Attending: Student | Admitting: Student

## 2022-10-07 ENCOUNTER — Encounter: Payer: Self-pay | Admitting: Student

## 2022-10-07 VITALS — BP 118/72 | HR 73 | Ht 62.5 in | Wt 143.0 lb

## 2022-10-07 DIAGNOSIS — E785 Hyperlipidemia, unspecified: Secondary | ICD-10-CM

## 2022-10-07 DIAGNOSIS — I472 Ventricular tachycardia, unspecified: Secondary | ICD-10-CM | POA: Diagnosis not present

## 2022-10-07 DIAGNOSIS — I159 Secondary hypertension, unspecified: Secondary | ICD-10-CM

## 2022-10-07 NOTE — Patient Instructions (Signed)
Medication Instructions:  Your physician recommends that you continue on your current medications as directed. Please refer to the Current Medication list given to you today.  *If you need a refill on your cardiac medications before your next appointment, please call your pharmacy*   Lab Work: None If you have labs (blood work) drawn today and your tests are completely normal, you will receive your results only by: [place] (if you have MyChart) OR A paper copy in the mail If you have any lab test that is abnormal or we need to change your treatment, we will call you to review the results.   Follow-Up: At [facility], you and your health needs are our priority.  As part of our continuing mission to provide you with exceptional heart care, we have created designated Provider Care Teams.  These Care Teams include your primary Cardiologist (physician) and Advanced Practice Providers (APPs -  Physician Assistants and Nurse Practitioners) who all work together to provide you with the care you need, when you need it.   Your next appointment:   6 month(s)  The format for your next appointment:   In Person  Provider:   Allegra Lai, MD     Important Information About Sugar

## 2022-10-11 NOTE — Addendum Note (Signed)
Addended by: Janan Halter F on: 10/11/2022 12:50 PM   Modules accepted: Orders

## 2022-11-30 ENCOUNTER — Encounter: Payer: Self-pay | Admitting: *Deleted

## 2022-11-30 NOTE — Progress Notes (Signed)
Receive a message from [facility] representative stating patient did not respond to their call to schedule mobile phlebotomy draw.  Representative states patient will need to reach out to [facility] directly at [phone] option #1 [phone] to schedule if patient wishes to proceed.

## 2023-01-04 ENCOUNTER — Other Ambulatory Visit: Payer: Self-pay | Admitting: *Deleted

## 2023-01-04 ENCOUNTER — Other Ambulatory Visit: Payer: Self-pay

## 2023-01-04 MED ORDER — LISINOPRIL 20 MG PO TABS: ORAL_TABLET | ORAL | 2 refills | Status: DC

## 2023-01-04 MED ORDER — DILTIAZEM HCL ER COATED BEADS 120 MG PO CP24: 120.0000 mg | ORAL_CAPSULE | Freq: Every day | ORAL | 1 refills | Status: DC

## 2023-01-04 MED ORDER — DILTIAZEM HCL ER COATED BEADS 120 MG PO CP24: 120.0000 mg | ORAL_CAPSULE | Freq: Every day | ORAL | 2 refills | Status: DC

## 2023-01-04 MED ORDER — LISINOPRIL 20 MG PO TABS: ORAL_TABLET | ORAL | 1 refills | Status: DC

## 2023-01-04 MED ORDER — FLECAINIDE ACETATE 100 MG PO TABS: 100.0000 mg | ORAL_TABLET | Freq: Two times a day (BID) | ORAL | 1 refills | Status: DC

## 2023-01-12 ENCOUNTER — Other Ambulatory Visit: Payer: Self-pay | Admitting: Hematology and Oncology

## 2023-01-12 DIAGNOSIS — Z853 Personal history of malignant neoplasm of breast: Secondary | ICD-10-CM

## 2023-01-18 ENCOUNTER — Other Ambulatory Visit: Payer: Self-pay | Admitting: Cardiology

## 2023-03-24 ENCOUNTER — Other Ambulatory Visit: Payer: Self-pay | Admitting: Cardiology

## 2023-04-12 ENCOUNTER — Ambulatory Visit: Payer: PRIVATE HEALTH INSURANCE | Attending: Cardiology | Admitting: Cardiology

## 2023-04-12 ENCOUNTER — Encounter: Payer: Self-pay | Admitting: Cardiology

## 2023-04-12 VITALS — BP 106/68 | HR 64 | Ht 62.5 in | Wt 140.6 lb

## 2023-04-12 DIAGNOSIS — I472 Ventricular tachycardia, unspecified: Secondary | ICD-10-CM | POA: Diagnosis not present

## 2023-04-12 NOTE — Progress Notes (Signed)
  Electrophysiology Office Note:   Date:  04/12/2023  ID:  Andrea, Clarke Sep 25, 1946, MRN [id number]  Primary Cardiologist: None Electrophysiologist: Saramarie Stinger Jorja Loa, MD      History of Present Illness:   Andrea Clarke is a 77 y.o. female with h/o wide-complex tachycardia seen today for routine electrophysiology followup.  Since last being seen in our clinic the patient reports doing.  She has had no chest pain or shortness of breath.  She has been able to do all her daily activities without restriction.  She has been able to exercise and has started doing Pilates.  she denies chest pain, palpitations, dyspnea, PND, orthopnea, nausea, vomiting, dizziness, syncope, edema, weight gain, or early satiety.    She has a history of hypertension, hyperlipidemia.  She had a Myoview which was read as low risk.  She wore a cardiac monitor that showed repeat salvos of wide-complex tachycardia up to 19 beats.  She had a monomorphic episode.  She also had episodes of SVT.  She is now on flecainide.   Review of systems complete and found to be negative unless listed in HPI.   Studies Reviewed:    EKG is ordered today. Personal review as below.  EKG Interpretation  Date/Time:  Wednesday April 12 2023 11:19:25 EDT Ventricular Rate:  64 PR Interval:  170 QRS Duration: 94 QT Interval:  408 QTC Calculation: 420 R Axis:   -55 Text Interpretation: Normal sinus rhythm Left axis deviation Low voltage QRS Cannot rule out Anterior infarct (cited on or before 19-Mar-2021) When compared with ECG of 19-Mar-2021 21:46, No significant change since last tracing Confirmed by Rollins Wrightson ([id number]) on 04/12/2023 11:20:35 AM    Risk Assessment/Calculations:              Physical Exam:   VS:  BP 106/68   Pulse 64   Ht 5' 2.5" (1.588 m)   Wt 140 lb 9.6 oz (63.8 kg)   SpO2 98%   BMI 25.31 kg/m    Wt Readings from Last 3 Encounters:  04/12/23 140 lb 9.6 oz (63.8 kg)  10/07/22 143 lb (64.9  kg)  08/08/22 140 lb (63.5 kg)     GEN: Well nourished, well developed in no acute distress NECK: No JVD; No carotid bruits CARDIAC: , no murmurs, rubs, gallops RESPIRATORY:  Clear to auscultation without rales, wheezing or rhonchi  ABDOMEN: Soft, non-tender, non-distended EXTREMITIES:  No edema; No deformity   ASSESSMENT AND PLAN:    1.  Ventricular tachycardia: Currently on flecainide and diltiazem.  No further episodes.  QRS has remained stable.  2.  Hypertension: well controlled  3.  Hyperlipidemia: Continue atorvastatin per primary physician  4.  High risk medication monitoring: Currently on flecainide.  QRS remains narrow  Follow up with EP APP in 12 months  Signed, Tailey Top Jorja Loa, MD

## 2023-04-12 NOTE — Patient Instructions (Signed)
Medication Instructions:  ?Your physician recommends that you continue on your current medications as directed. Please refer to the Current Medication list given to you today. ? ?*If you need a refill on your cardiac medications before your next appointment, please call your pharmacy* ? ? ?Lab Work: ?None ordered ? ? ?Testing/Procedures: ?None ordered ? ? ?Follow-Up: ?At [facility], you and your health needs are our priority.  As part of our continuing mission to provide you with exceptional heart care, we have created designated Provider Care Teams.  These Care Teams include your primary Cardiologist (physician) and Advanced Practice Providers (APPs -  Physician Assistants and Nurse Practitioners) who all work together to provide you with the care you need, when you need it. ? ?Your next appointment:   ?1 year(s) ? ?The format for your next appointment:   ?In Person ? ?Provider:   ?Michael "Andy" Tillery, PA-C ? ? ? ?Thank you for choosing [facility]!! ? ? ?Shantele Reller, RN ?[phone] ?

## 2023-05-23 ENCOUNTER — Other Ambulatory Visit: Payer: Self-pay | Admitting: Cardiology

## 2023-08-18 ENCOUNTER — Ambulatory Visit
Admission: RE | Admit: 2023-08-18 | Discharge: 2023-08-18 | Disposition: A | Discharge status: Home / Self Care | Payer: PRIVATE HEALTH INSURANCE | Source: Ambulatory Visit | Attending: Hematology and Oncology | Admitting: Hematology and Oncology

## 2023-08-18 DIAGNOSIS — Z853 Personal history of malignant neoplasm of breast: Secondary | ICD-10-CM

## 2023-08-30 ENCOUNTER — Inpatient Hospital Stay: Payer: PRIVATE HEALTH INSURANCE | Attending: Hematology and Oncology | Admitting: Hematology and Oncology

## 2023-08-30 ENCOUNTER — Other Ambulatory Visit: Payer: Self-pay | Admitting: Cardiology

## 2023-08-30 VITALS — BP 134/76 | HR 64 | Temp 97.8°F | Resp 18 | Ht 62.5 in | Wt 138.2 lb

## 2023-08-30 DIAGNOSIS — Z923 Personal history of irradiation: Secondary | ICD-10-CM | POA: Insufficient documentation

## 2023-08-30 DIAGNOSIS — Z853 Personal history of malignant neoplasm of breast: Secondary | ICD-10-CM | POA: Diagnosis present

## 2023-08-30 DIAGNOSIS — Z171 Estrogen receptor negative status [ER-]: Secondary | ICD-10-CM | POA: Diagnosis not present

## 2023-08-30 DIAGNOSIS — C50212 Malignant neoplasm of upper-inner quadrant of left female breast: Secondary | ICD-10-CM

## 2023-08-30 NOTE — Progress Notes (Signed)
Patient Care Team: Andreas Blower., MD as PCP - General (Internal Medicine) Regan Lemming, MD as PCP - Electrophysiology (Cardiology) Emelia Loron, MD as Consulting Physician (General Surgery) Serena Croissant, MD as Consulting Physician (Hematology and Oncology) Dorothy Puffer, MD as Consulting Physician (Radiation Oncology)  DIAGNOSIS:  Encounter Diagnosis  Name Primary?   Malignant neoplasm of upper-inner quadrant of left breast in female, estrogen receptor negative (HCC) Yes    SUMMARY OF ONCOLOGIC HISTORY: Oncology History  Malignant neoplasm of upper-inner quadrant of left breast in female, estrogen receptor negative (HCC)  08/17/2018 Initial Diagnosis   Screening mammogram detected abnormality in the left breast by ultrasound irregular mass 10:00 position 1.3 cm, 10 o'clock position no enlarged lymph nodes, Biopsy ([id number]) revealed: IMC, grade 3; ER 0%, PR 0%, Ki-67 80%, HER-2 positive 3+ by [facility]   08/28/2018 Cancer Staging   Staging form: Breast, AJCC 8th Edition - Clinical stage from 08/28/2018: Stage IA (cT1c, cN0, cM0, G3, ER-, PR-, HER2+) - Signed by Serena Croissant, MD on 08/28/2018   08/28/2018 Breast MRI   malignancy within the UPPER INNER LEFT breast with dominant mass measuring 2.1 cm. With small adjacent satellite nodules, the entire area 1.5 x 3 x 2.2 cm.No evidence of abnormal lymph nodes or RIGHT breast malignancy.   09/03/2018 -  Chemotherapy   Weekly Taxol and Herceptin followed by maintenance Herceptin for one year.  EF: 60-65% on 06/21/2019   12/05/2018 Surgery   Left lumpectomy Dwain Sarna) [phone]): No residual invasive carcinoma, margins negative, 0/2 lymph nodes negative, complete pathologic response [id number]   01/15/2019 - 02/11/2019 Radiation Therapy   The patient initially received a dose of 42.56 Gy in 16 fractions to the left breast using whole-breast tangent fields. This was delivered using a 3-D conformal technique. The patient then  received a boost to the seroma. This delivered an additional 8 Gy in 4 fractions using a 3 field photon technique due to the depth of the seroma. The total dose was 50.56 Gy.     CHIEF COMPLIANT: Surveillance of breast cancer  HISTORY OF PRESENT ILLNESS:   History of Present Illness   The patient, a 77 year old with a history of breast cancer, presents for a routine follow-up. She reports excellent health over the past year, with no new or concerning symptoms. She has been maintaining an active lifestyle, participating in Pilates five days a week. She has also experienced weight loss of about twenty pounds through a combination of physical activity and a healthy diet.  The patient notes a change in the appearance of the nipple on the breast where she previously had cancer. She describes a scab-like or crusty change that she has noticed over the past couple of months. She denies any associated pain or discomfort.  In addition to her physical health, the patient has undergone significant changes in her personal life. She has separated from her husband of 37 years due to his alcoholism, which she describes as a significant source of stress. She reports feeling calmer and happier since the separation.         ALLERGIES:  is allergic to adhesive [tape], oxycodone hcl, augmentin [amoxicillin-pot clavulanate], and xylocaine [lidocaine hcl].  MEDICATIONS:  Current Outpatient Medications  Medication Sig Dispense Refill   acetaminophen (TYLENOL) 500 MG tablet Take 1,000 mg by mouth daily as needed for moderate pain or headache.      atorvastatin (LIPITOR) 20 MG tablet Take 20 mg by mouth daily.     cetirizine (ZYRTEC) 10  MG tablet Take 10 mg by mouth daily.     Cholecalciferol (VITAMIN D3) 125 MCG (5000 UT) CAPS Take 5,000 Units by mouth at bedtime.      Coenzyme Q10 10 MG capsule Take 10 mg by mouth at bedtime.     cycloSPORINE (RESTASIS) 0.05 % ophthalmic emulsion Place 1 drop into both eyes 2  (two) times daily.     dexlansoprazole (DEXILANT) 60 MG capsule Take 60 mg by mouth daily.     diltiazem (CARDIZEM CD) 120 MG 24 hr capsule TAKE 1 CAPSULE(120 MG) BY MOUTH DAILY 90 capsule 1   flecainide (TAMBOCOR) 100 MG tablet TAKE 1 TABLET BY MOUTH TWICE  DAILY 180 tablet 3   fluticasone (FLONASE) 50 MCG/ACT nasal spray Place 1 spray into both nostrils daily as needed for allergies.      furosemide (LASIX) 20 MG tablet TAKE 1 TABLET(20 MG) BY MOUTH DAILY 30 tablet 0   Krill Oil 500 MG CAPS Take 500 mg by mouth at bedtime.     lisinopril (ZESTRIL) 20 MG tablet TAKE 1 TABLET(20 MG) BY MOUTH DAILY 90 tablet 2   montelukast (SINGULAIR) 10 MG tablet Take 10 mg by mouth daily.     Multiple Vitamin (MULTIVITAMIN) tablet Take 1 tablet by mouth at bedtime.      Polyethyl Glycol-Propyl Glycol (SYSTANE OP) Place 1 drop into both eyes 2 (two) times daily as needed (dry eyes).     polyethylene glycol (MIRALAX / GLYCOLAX) 17 g packet Take 17 g by mouth daily. 14 each 0   RYBELSUS 7 MG TABS Take 1 tablet by mouth daily.     No current facility-administered medications for this visit.    PHYSICAL EXAMINATION: ECOG PERFORMANCE STATUS: 1 - Symptomatic but completely ambulatory  Vitals:   08/30/23 1117  BP: 134/76  Pulse: 64  Resp: 18  Temp: 97.8 F (36.6 C)  SpO2: 99%   Filed Weights   08/30/23 1117  Weight: 138 lb 3.2 oz (62.7 kg)    Physical Exam   MEASUREMENTS: Weight loss of approximately 20 pounds BREAST: Crusting around nipple, scar pulling nipple.      (exam performed in the presence of a chaperone)  LABORATORY DATA:  I have reviewed the data as listed    Latest Ref Rng & Units 04/29/2022    8:30 PM 09/23/2019    1:33 PM 07/30/2019    9:15 AM  CMP  Glucose 70 - 99 mg/dL 540  981  191   BUN 8 - 23 mg/dL 17  17  16    Creatinine 0.44 - 1.00 mg/dL 4.78  2.95  6.21   Sodium 135 - 145 mmol/L 135  141  139   Potassium 3.5 - 5.1 mmol/L 4.5  4.2  4.2   Chloride 98 - 111 mmol/L 100   101  103   CO2 22 - 32 mmol/L 25  26  25    Calcium 8.9 - 10.3 mg/dL 9.5  9.3  9.1   Total Protein 6.5 - 8.1 g/dL 7.4  6.3  6.9   Total Bilirubin 0.3 - 1.2 mg/dL 0.9  0.5  0.7   Alkaline Phos 38 - 126 U/L 98  116  105   AST 15 - 41 U/L 33  74  81   ALT 0 - 44 U/L 38  109  112     Lab Results  Component Value Date   WBC 10.7 (H) 04/29/2022   HGB 15.1 (H) 04/29/2022   HCT 41.9  04/29/2022   MCV 98.1 04/29/2022   PLT 192 04/29/2022   NEUTROABS 9.1 (H) 04/29/2022    ASSESSMENT & PLAN:  Malignant neoplasm of upper-inner quadrant of left breast in female, estrogen receptor negative (HCC) 08/17/2018:Screening mammogram detected abnormality in the left breast by ultrasound irregular mass 10:00 position 1.3 cm, 10 o'clock position no enlarged lymph nodes, ER 0%, PR 0%, Ki-67 80%, HER-2 +3+ by IHC, grade 3, lymphovascular invasion present, T1c N0 stage Ia clinical stage   12/05/2018: Left lumpectomy: No residual invasive carcinoma, margins negative, 0/2 lymph nodes negative, complete pathologic response ypT0ypN0 Adjuvant radiation therapy: 01/16/2019-02/11/2019 Herceptin maintenance completed October 2020   Treatment plan: Surveillance   Breast cancer surveillance: 1.breast exam: 08/03/2022: Benign 2.mammogram: Scheduled for 08/15/2022 3.  Signatera: Negative  We will like to do Signatera every 6 months.   She participates in fundraising for [email or web address] Return to clinic in 1 year for follow-up     No orders of the defined types were placed in this encounter.  The patient has a good understanding of the overall plan. she agrees with it. she will call with any problems that may develop before the next visit here. Total time spent: 30 mins including face to face time and time spent for planning, charting and co-ordination of care   Tamsen Meek, MD 08/30/23

## 2023-08-30 NOTE — Assessment & Plan Note (Addendum)
08/17/2018:Screening mammogram detected abnormality in the left breast by ultrasound irregular mass 10:00 position 1.3 cm, 10 o'clock position no enlarged lymph nodes, ER 0%, PR 0%, Ki-67 80%, HER-2 +3+ by IHC, grade 3, lymphovascular invasion present, T1c N0 stage Ia clinical stage   12/05/2018: Left lumpectomy: No residual invasive carcinoma, margins negative, 0/2 lymph nodes negative, complete pathologic response [id number] Adjuvant radiation therapy: 01/16/2019-02/11/2019 Herceptin maintenance completed October 2020   Treatment plan: Surveillance   Breast cancer surveillance: 1.breast exam: 08/03/2022: Benign 2.mammogram: Scheduled for 08/15/2022 3.  Signatera: Negative  We will like to do Signatera every 6 months.   She participates in fundraising for [email or web address] Return to clinic in 1 year for follow-up

## 2023-10-28 ENCOUNTER — Other Ambulatory Visit: Payer: Self-pay | Admitting: Cardiology

## 2024-04-03 ENCOUNTER — Other Ambulatory Visit: Payer: Self-pay | Admitting: Cardiology

## 2024-04-15 ENCOUNTER — Other Ambulatory Visit: Payer: Self-pay | Admitting: Cardiology

## 2024-04-28 ENCOUNTER — Other Ambulatory Visit: Payer: Self-pay | Admitting: Cardiology

## 2024-05-04 ENCOUNTER — Other Ambulatory Visit: Payer: Self-pay | Admitting: Cardiology

## 2024-05-15 ENCOUNTER — Other Ambulatory Visit: Payer: Self-pay | Admitting: Cardiology

## 2024-05-17 ENCOUNTER — Telehealth: Payer: Self-pay | Admitting: Cardiology

## 2024-05-17 NOTE — Telephone Encounter (Signed)
 Spoke to pt, aware she will need to be seen for overdue follow up.  She is scheduled for October and aware she will need to let us  know to send in refills if continuing the medication at that appt. Will send in 90 days supply to get her through to appt, but knows further refills depend on coming to October follow up Patient verbalized understanding and agreeable to plan.

## 2024-05-17 NOTE — Telephone Encounter (Signed)
 Patient returned RN Sherri's call stating she has now scheduled with Dr. Inocencio on 10/1.

## 2024-05-17 NOTE — Telephone Encounter (Signed)
 Left message to call back to discuss refill request and overdue appointment need.

## 2024-05-17 NOTE — Telephone Encounter (Signed)
 See Flecainide  refill request, but did speak with pt and this matter has been addressed. Refill sent in, follow up in October.

## 2024-05-19 ENCOUNTER — Other Ambulatory Visit: Payer: Self-pay | Admitting: Cardiology

## 2024-06-03 ENCOUNTER — Other Ambulatory Visit: Payer: Self-pay | Admitting: Cardiology

## 2024-07-15 ENCOUNTER — Other Ambulatory Visit: Payer: Self-pay | Admitting: Hematology and Oncology

## 2024-07-15 DIAGNOSIS — Z1231 Encounter for screening mammogram for malignant neoplasm of breast: Secondary | ICD-10-CM

## 2024-07-17 ENCOUNTER — Ambulatory Visit: Payer: PRIVATE HEALTH INSURANCE | Admitting: Cardiology

## 2024-08-02 ENCOUNTER — Encounter: Payer: Self-pay | Admitting: Cardiology

## 2024-08-02 ENCOUNTER — Ambulatory Visit: Payer: PRIVATE HEALTH INSURANCE | Attending: Cardiology | Admitting: Cardiology

## 2024-08-02 ENCOUNTER — Ambulatory Visit
Admission: RE | Admit: 2024-08-02 | Discharge: 2024-08-02 | Disposition: A | Payer: PRIVATE HEALTH INSURANCE | Source: Ambulatory Visit | Attending: Hematology and Oncology

## 2024-08-02 VITALS — BP 118/64 | HR 62 | Ht 62.5 in | Wt 132.0 lb

## 2024-08-02 DIAGNOSIS — I472 Ventricular tachycardia, unspecified: Secondary | ICD-10-CM | POA: Diagnosis not present

## 2024-08-02 DIAGNOSIS — I10 Essential (primary) hypertension: Secondary | ICD-10-CM

## 2024-08-02 DIAGNOSIS — Z79899 Other long term (current) drug therapy: Secondary | ICD-10-CM

## 2024-08-02 DIAGNOSIS — Z1231 Encounter for screening mammogram for malignant neoplasm of breast: Secondary | ICD-10-CM

## 2024-08-02 NOTE — Patient Instructions (Signed)
 Medication Instructions:  Your physician recommends that you continue on your current medications as directed. Please refer to the Current Medication list given to you today.  *If you need a refill on your cardiac medications before your next appointment, please call your pharmacy* Follow-Up: At [facility], you and your health needs are our priority.  As part of our continuing mission to provide you with exceptional heart care, our providers are all part of one team.  This team includes your primary Cardiologist (physician) and Advanced Practice Providers or APPs (Physician Assistants and Nurse Practitioners) who all work together to provide you with the care you need, when you need it.  Your next appointment:   1 year(s)  Provider:   You may see Will Gladis Norton, MD or one of the following Advanced Practice Providers on your designated Care Team:   Charlies Arthur, [place] Ozell Jodie Passey, PA-C Suzann Riddle, NP Daphne Barrack, NP Artist Pouch, PA-C

## 2024-08-02 NOTE — Progress Notes (Signed)
  Electrophysiology Office Note:   Date:  08/02/2024  ID:  Andrea, Clarke November 03, 1945, MRN [id number]  Primary Cardiologist: None Primary Heart Failure: None Electrophysiologist: Sharlena Kristensen Gladis Norton, MD      History of Present Illness:   Andrea Clarke is a 78 y.o. female with h/o wide-complex tachycardia, hypertension, hyperlipidemia seen today for routine electrophysiology followup.   Since last being seen in our clinic the patient reports doing well.  She has noted no further arrhythmias.  She is exercising multiple days a week without issue.  She has recently moved to [place].  she denies chest pain, palpitations, dyspnea, PND, orthopnea, nausea, vomiting, dizziness, syncope, edema, weight gain, or early satiety.   Review of systems complete and found to be negative unless listed in HPI.   EP Information / Studies Reviewed:    EKG is ordered today. Personal review as below.        Risk Assessment/Calculations:            Physical Exam:   VS:  BP 118/64 (BP Location: Right Arm, Patient Position: Sitting, Cuff Size: Normal)   Pulse 62   Ht 5' 2.5 (1.588 m)   Wt 132 lb (59.9 kg)   SpO2 95%   BMI 23.76 kg/m    Wt Readings from Last 3 Encounters:  08/02/24 132 lb (59.9 kg)  08/30/23 138 lb 3.2 oz (62.7 kg)  04/12/23 140 lb 9.6 oz (63.8 kg)     GEN: Well nourished, well developed in no acute distress NECK: No JVD; No carotid bruits CARDIAC: Regular rate and rhythm, no murmurs, rubs, gallops RESPIRATORY:  Clear to auscultation without rales, wheezing or rhonchi  ABDOMEN: Soft, non-tender, non-distended EXTREMITIES:  No edema; No deformity   ASSESSMENT AND PLAN:    1.  Ventricular tachycardia: On diltiazem  and flecainide .  No further episodes.  QRS rhythms remained stable.  2.  Hypertension: Well-controlled  3.  High-risk medication monitoring: QRS remains stable on flecainide .  Follow up with EP Team in 12 months  Signed, Camaryn Lumbert Gladis Norton, MD

## 2024-08-04 ENCOUNTER — Other Ambulatory Visit: Payer: Self-pay | Admitting: Cardiology

## 2024-08-29 ENCOUNTER — Inpatient Hospital Stay: Payer: PRIVATE HEALTH INSURANCE | Attending: Hematology and Oncology | Admitting: Hematology and Oncology

## 2024-08-29 ENCOUNTER — Other Ambulatory Visit: Payer: Self-pay | Admitting: *Deleted

## 2024-08-29 VITALS — BP 130/67 | HR 62 | Temp 97.8°F | Resp 16 | Ht 62.5 in | Wt 135.1 lb

## 2024-08-29 DIAGNOSIS — Z923 Personal history of irradiation: Secondary | ICD-10-CM | POA: Insufficient documentation

## 2024-08-29 DIAGNOSIS — Z171 Estrogen receptor negative status [ER-]: Secondary | ICD-10-CM | POA: Diagnosis not present

## 2024-08-29 DIAGNOSIS — C50212 Malignant neoplasm of upper-inner quadrant of left female breast: Secondary | ICD-10-CM

## 2024-08-29 DIAGNOSIS — Z9221 Personal history of antineoplastic chemotherapy: Secondary | ICD-10-CM | POA: Insufficient documentation

## 2024-08-29 DIAGNOSIS — Z853 Personal history of malignant neoplasm of breast: Secondary | ICD-10-CM | POA: Insufficient documentation

## 2024-08-29 NOTE — Progress Notes (Signed)
 Signatera renewal orders placed.

## 2024-08-29 NOTE — Assessment & Plan Note (Signed)
 08/17/2018:Screening mammogram detected abnormality in the left breast by ultrasound irregular mass 10:00 position 1.3 cm, 10 o'clock position no enlarged lymph nodes, ER 0%, PR 0%, Ki-67 80%, HER-2 +3+ by IHC, grade 3, lymphovascular invasion present, T1c N0 stage Ia clinical stage   12/05/2018: Left lumpectomy: No residual invasive carcinoma, margins negative, 0/2 lymph nodes negative, complete pathologic response [id number] Adjuvant radiation therapy: 01/16/2019-02/11/2019 Herceptin  maintenance completed October 2020   Treatment plan: Surveillance   Breast cancer surveillance: 1.breast exam: 08/29/2024: Benign 2.mammogram: 08/07/2024: Benign breast density category B 3.  Signatera: Negative  We will like to do Signatera every 6 months.   She participates in fundraising for [email or web address] Return to clinic in 1 year for follow-up

## 2024-08-29 NOTE — Progress Notes (Signed)
 Patient Care Team: Delilah Murray HERO., MD as PCP - General (Internal Medicine) Inocencio Soyla Lunger, MD as PCP - Electrophysiology (Cardiology) Ebbie Cough, MD as Consulting Physician (General Surgery) Odean Potts, MD as Consulting Physician (Hematology and Oncology) Dewey Rush, MD as Consulting Physician (Radiation Oncology)  DIAGNOSIS:  Encounter Diagnosis  Name Primary?   Malignant neoplasm of upper-inner quadrant of left breast in female, estrogen receptor negative (HCC) Yes    SUMMARY OF ONCOLOGIC HISTORY: Oncology History  Malignant neoplasm of upper-inner quadrant of left breast in female, estrogen receptor negative (HCC)  08/17/2018 Initial Diagnosis   Screening mammogram detected abnormality in the left breast by ultrasound irregular mass 10:00 position 1.3 cm, 10 o'clock position no enlarged lymph nodes, Biopsy ([id number]) revealed: IMC, grade 3; ER 0%, PR 0%, Ki-67 80%, HER-2 positive 3+ by IHC   08/28/2018 Cancer Staging   Staging form: Breast, AJCC 8th Edition - Clinical stage from 08/28/2018: Stage IA (cT1c, cN0, cM0, G3, ER-, PR-, HER2+) - Signed by Odean Potts, MD on 08/28/2018   08/28/2018 Breast MRI   malignancy within the UPPER INNER LEFT breast with dominant mass measuring 2.1 cm. With small adjacent satellite nodules, the entire area 1.5 x 3 x 2.2 cm.No evidence of abnormal lymph nodes or RIGHT breast malignancy.   09/03/2018 -  Chemotherapy   Weekly Taxol  and Herceptin  followed by maintenance Herceptin  for one year.  EF: 60-65% on 06/21/2019   12/05/2018 Surgery   Left lumpectomy Viktoria) [phone]): No residual invasive carcinoma, margins negative, 0/2 lymph nodes negative, complete pathologic response [id number]   01/15/2019 - 02/11/2019 Radiation Therapy   The patient initially received a dose of 42.56 Gy in 16 fractions to the left breast using whole-breast tangent fields. This was delivered using a 3-D conformal technique. The patient then  received a boost to the seroma. This delivered an additional 8 Gy in 4 fractions using a 3 field photon technique due to the depth of the seroma. The total dose was 50.56 Gy.     CHIEF COMPLIANT: Surveillance of breast cancer  HISTORY OF PRESENT ILLNESS:  History of Present Illness Andrea Clarke is a 78 year old female who presents for follow-up regarding surveillance of history of breast cancer.     She has not been contacted for her Signatera testing since her last visit. She was unaware that the testing was supposed to occur every quarter and had previously declined a follow-up call, thinking it was unnecessary. She recalls being informed during her last visit that the testing frequency had changed to every six months.  She recently moved to [facility] in August and is now living there permanently. She sold her house in [place] [facility] and is enjoying her new location, which is close to [facility].  No chest pain or discomfort.     ALLERGIES:  is allergic to oxycodone  hcl, adhesive [tape], augmentin [amoxicillin-pot clavulanate], and xylocaine  [lidocaine  hcl].  MEDICATIONS:  Current Outpatient Medications  Medication Sig Dispense Refill   acetaminophen  (TYLENOL ) 500 MG tablet Take 1,000 mg by mouth daily as needed for moderate pain or headache.      atorvastatin (LIPITOR) 20 MG tablet Take 20 mg by mouth daily.     cetirizine (ZYRTEC) 10 MG tablet Take 10 mg by mouth daily.     Cholecalciferol (VITAMIN D3) 125 MCG (5000 UT) CAPS Take 5,000 Units by mouth at bedtime.      Coenzyme Q10 10 MG capsule Take 10 mg by mouth at bedtime.  cycloSPORINE (RESTASIS) 0.05 % ophthalmic emulsion Place 1 drop into both eyes 2 (two) times daily.     dexlansoprazole (DEXILANT) 60 MG capsule Take 60 mg by mouth daily.     diltiazem  (CARDIZEM  CD) 120 MG 24 hr capsule TAKE 1 CAPSULE BY MOUTH DAILY 90 capsule 3   flecainide  (TAMBOCOR ) 100 MG tablet TAKE 1 TABLET BY MOUTH TWICE  DAILY 180  tablet 3   fluticasone (FLONASE) 50 MCG/ACT nasal spray Place 1 spray into both nostrils daily as needed for allergies.      furosemide  (LASIX ) 20 MG tablet TAKE 1 TABLET(20 MG) BY MOUTH DAILY 30 tablet 0   Krill Oil 500 MG CAPS Take 500 mg by mouth at bedtime.     lisinopril  (ZESTRIL ) 20 MG tablet TAKE 1 TABLET BY MOUTH DAILY 90 tablet 3   montelukast (SINGULAIR) 10 MG tablet Take 10 mg by mouth daily.     Multiple Vitamin (MULTIVITAMIN) tablet Take 1 tablet by mouth at bedtime.      RYBELSUS 7 MG TABS Take 1 tablet by mouth daily.     No current facility-administered medications for this visit.    PHYSICAL EXAMINATION: ECOG PERFORMANCE STATUS: 1 - Symptomatic but completely ambulatory  Vitals:   08/29/24 0909  BP: 130/67  Pulse: 62  Resp: 16  Temp: 97.8 F (36.6 C)  SpO2: 98%   Filed Weights   08/29/24 0909  Weight: 135 lb 1.6 oz (61.3 kg)    Physical Exam Breast exam: No palpable lumps or nodules  (exam performed in the presence of a chaperone)  LABORATORY DATA:  I have reviewed the data as listed    Latest Ref Rng & Units 04/29/2022    8:30 PM 09/23/2019    1:33 PM 07/30/2019    9:15 AM  CMP  Glucose 70 - 99 mg/dL 803  863  862   BUN 8 - 23 mg/dL 17  17  16    Creatinine 0.44 - 1.00 mg/dL 9.06  9.16  9.21   Sodium 135 - 145 mmol/L 135  141  139   Potassium 3.5 - 5.1 mmol/L 4.5  4.2  4.2   Chloride 98 - 111 mmol/L 100  101  103   CO2 22 - 32 mmol/L 25  26  25    Calcium 8.9 - 10.3 mg/dL 9.5  9.3  9.1   Total Protein 6.5 - 8.1 g/dL 7.4  6.3  6.9   Total Bilirubin 0.3 - 1.2 mg/dL 0.9  0.5  0.7   Alkaline Phos 38 - 126 U/L 98  116  105   AST 15 - 41 U/L 33  74  81   ALT 0 - 44 U/L 38  109  112     Lab Results  Component Value Date   WBC 10.7 (H) 04/29/2022   HGB 15.1 (H) 04/29/2022   HCT 41.9 04/29/2022   MCV 98.1 04/29/2022   PLT 192 04/29/2022   NEUTROABS 9.1 (H) 04/29/2022    ASSESSMENT & PLAN:  Malignant neoplasm of upper-inner quadrant of left  breast in female, estrogen receptor negative (HCC) 08/17/2018:Screening mammogram detected abnormality in the left breast by ultrasound irregular mass 10:00 position 1.3 cm, 10 o'clock position no enlarged lymph nodes, ER 0%, PR 0%, Ki-67 80%, HER-2 +3+ by IHC, grade 3, lymphovascular invasion present, T1c N0 stage Ia clinical stage   12/05/2018: Left lumpectomy: No residual invasive carcinoma, margins negative, 0/2 lymph nodes negative, complete pathologic response [id number] Adjuvant radiation therapy: 01/16/2019-02/11/2019  Herceptin  maintenance completed October 2020   Treatment plan: Surveillance   Breast cancer surveillance: 1.breast exam: 08/29/2024: Benign 2.mammogram: 08/07/2024: Benign breast density category B 3.  Signatera: Negative  Somehow Signatera stopped doing her testing.  She moved to [place] since her divorce has been finalized.  We will request them to draw labs in [place].  Her daughter still lives in [place].   Return to clinic in 1 year for follow-up      No orders of the defined types were placed in this encounter.  The patient has a good understanding of the overall plan. she agrees with it. she will call with any problems that may develop before the next visit here.  I personally spent a total of 30 minutes in the care of the patient today including preparing to see the patient, getting/reviewing separately obtained history, performing a medically appropriate exam/evaluation, counseling and educating, placing orders, referring and communicating with other health care professionals, documenting clinical information in the EHR, independently interpreting results, communicating results, and coordinating care.   Viinay K Adolpho Meenach, MD 08/29/24

## 2024-09-13 LAB — SIGNATERA ONLY (NATERA MANAGED)
SIGNATERA MTM READOUT: 0 MTM/ml
SIGNATERA TEST RESULT: NEGATIVE

## 2025-08-28 ENCOUNTER — Inpatient Hospital Stay: Payer: PRIVATE HEALTH INSURANCE | Admitting: Hematology and Oncology
